# Patient Record
Sex: Female | Born: 1950 | Race: White | Hispanic: No | Marital: Married | Smoking: Former smoker
Health system: Southern US, Community
[De-identification: ages and names within clinical notes are randomized; demographics above are authoritative.]

## PROBLEM LIST (undated history)

## (undated) DIAGNOSIS — G4730 Sleep apnea, unspecified: Secondary | ICD-10-CM

## (undated) DIAGNOSIS — N6019 Diffuse cystic mastopathy of unspecified breast: Secondary | ICD-10-CM

## (undated) DIAGNOSIS — M1990 Unspecified osteoarthritis, unspecified site: Secondary | ICD-10-CM

## (undated) DIAGNOSIS — K219 Gastro-esophageal reflux disease without esophagitis: Secondary | ICD-10-CM

## (undated) DIAGNOSIS — J3089 Other allergic rhinitis: Secondary | ICD-10-CM

## (undated) DIAGNOSIS — I4891 Unspecified atrial fibrillation: Secondary | ICD-10-CM

## (undated) DIAGNOSIS — E7800 Pure hypercholesterolemia, unspecified: Secondary | ICD-10-CM

## (undated) DIAGNOSIS — IMO0001 Reserved for inherently not codable concepts without codable children: Secondary | ICD-10-CM

## (undated) DIAGNOSIS — I10 Essential (primary) hypertension: Secondary | ICD-10-CM

## (undated) HISTORY — DX: Gastro-esophageal reflux disease without esophagitis: K21.9

## (undated) HISTORY — DX: Pure hypercholesterolemia, unspecified: E78.00

## (undated) HISTORY — PX: TONSILLECTOMY: SUR1361

## (undated) HISTORY — DX: Unspecified atrial fibrillation: I48.91

## (undated) HISTORY — DX: Unspecified osteoarthritis, unspecified site: M19.90

## (undated) HISTORY — DX: Diffuse cystic mastopathy of unspecified breast: N60.19

## (undated) HISTORY — DX: Essential (primary) hypertension: I10

---

## 2001-03-30 HISTORY — PX: CHOLECYSTECTOMY: SHX55

## 2002-03-30 HISTORY — PX: REPLACEMENT TOTAL KNEE: SUR1224

## 2004-10-01 ENCOUNTER — Ambulatory Visit: Payer: Self-pay | Admitting: Internal Medicine

## 2005-10-14 ENCOUNTER — Ambulatory Visit: Payer: Self-pay | Admitting: Internal Medicine

## 2006-03-30 DIAGNOSIS — I4891 Unspecified atrial fibrillation: Secondary | ICD-10-CM

## 2006-03-30 HISTORY — DX: Unspecified atrial fibrillation: I48.91

## 2006-11-04 ENCOUNTER — Ambulatory Visit: Payer: Self-pay | Admitting: Internal Medicine

## 2007-02-18 ENCOUNTER — Ambulatory Visit: Payer: Self-pay | Admitting: Unknown Physician Specialty

## 2007-02-18 ENCOUNTER — Other Ambulatory Visit: Payer: Self-pay

## 2007-02-28 HISTORY — PX: REPLACEMENT TOTAL KNEE: SUR1224

## 2007-03-08 ENCOUNTER — Other Ambulatory Visit: Payer: Self-pay

## 2007-03-08 ENCOUNTER — Inpatient Hospital Stay: Payer: Self-pay | Admitting: Unknown Physician Specialty

## 2007-03-28 ENCOUNTER — Encounter: Payer: Self-pay | Admitting: Unknown Physician Specialty

## 2007-03-31 ENCOUNTER — Encounter: Payer: Self-pay | Admitting: Unknown Physician Specialty

## 2007-05-01 ENCOUNTER — Encounter: Payer: Self-pay | Admitting: Unknown Physician Specialty

## 2007-09-13 HISTORY — PX: OTHER SURGICAL HISTORY: SHX169

## 2007-11-08 ENCOUNTER — Ambulatory Visit: Payer: Self-pay | Admitting: Internal Medicine

## 2007-11-23 ENCOUNTER — Ambulatory Visit: Payer: Self-pay | Admitting: Internal Medicine

## 2008-07-02 ENCOUNTER — Ambulatory Visit: Payer: Self-pay | Admitting: Internal Medicine

## 2008-12-19 ENCOUNTER — Ambulatory Visit: Payer: Self-pay | Admitting: Internal Medicine

## 2009-09-27 ENCOUNTER — Ambulatory Visit: Payer: Self-pay | Admitting: Internal Medicine

## 2009-12-30 ENCOUNTER — Ambulatory Visit: Payer: Self-pay | Admitting: Internal Medicine

## 2010-05-20 ENCOUNTER — Ambulatory Visit: Payer: Self-pay | Admitting: Oncology

## 2010-05-29 ENCOUNTER — Ambulatory Visit: Payer: Self-pay | Admitting: Oncology

## 2010-06-29 ENCOUNTER — Ambulatory Visit: Payer: Self-pay | Admitting: Oncology

## 2011-02-17 ENCOUNTER — Ambulatory Visit: Payer: Self-pay | Admitting: Internal Medicine

## 2012-01-05 ENCOUNTER — Other Ambulatory Visit: Payer: Self-pay | Admitting: *Deleted

## 2012-01-05 MED ORDER — ROSUVASTATIN CALCIUM 10 MG PO TABS: 10.0000 mg | ORAL_TABLET | Freq: Every day | ORAL | Status: DC

## 2012-01-05 MED ORDER — DILTIAZEM HCL ER 120 MG PO CP24: 120.0000 mg | ORAL_CAPSULE | Freq: Every day | ORAL | Status: DC

## 2012-01-05 MED ORDER — TRIAMTERENE-HCTZ 37.5-25 MG PO CAPS: 1.0000 | ORAL_CAPSULE | Freq: Every day | ORAL | Status: DC

## 2012-01-05 NOTE — Telephone Encounter (Signed)
Rx was faxed to pharmacy.  

## 2012-01-05 NOTE — Addendum Note (Signed)
Addended by: Gilmer Mor on: 01/05/2012 08:24 AM   Modules accepted: Orders

## 2012-01-27 ENCOUNTER — Telehealth: Payer: Self-pay | Admitting: Internal Medicine

## 2012-01-27 NOTE — Telephone Encounter (Signed)
Refill request for meloxicam 7.5 mg tab #30 Sig: take 1 tablet every day Patient has an appointment on 03/08/12

## 2012-01-29 MED ORDER — MELOXICAM 7.5 MG PO TABS: 7.5000 mg | ORAL_TABLET | Freq: Every day | ORAL | Status: DC

## 2012-01-29 NOTE — Telephone Encounter (Signed)
Ok x 2  

## 2012-02-24 ENCOUNTER — Telehealth: Payer: Self-pay | Admitting: Internal Medicine

## 2012-02-24 MED ORDER — MONTELUKAST SODIUM 10 MG PO TABS: 10.0000 mg | ORAL_TABLET | Freq: Every day | ORAL | Status: DC

## 2012-02-24 NOTE — Telephone Encounter (Signed)
Refill request for montelukast sodium 10 mg tab #30 Sig: take 1 tablet at bedtime Patient does have an appt on 12/10

## 2012-02-24 NOTE — Telephone Encounter (Signed)
Filled script per epic 

## 2012-02-24 NOTE — Telephone Encounter (Signed)
Ok to fill #30 with 2 refills

## 2012-02-24 NOTE — Telephone Encounter (Signed)
Dr. Lorin Picket:  Patient requesting new script for montelukast 10 mg  1 at bedtime

## 2012-03-08 ENCOUNTER — Ambulatory Visit (INDEPENDENT_AMBULATORY_CARE_PROVIDER_SITE_OTHER): Payer: BC Managed Care – PPO | Admitting: Internal Medicine

## 2012-03-08 ENCOUNTER — Encounter: Payer: Self-pay | Admitting: Internal Medicine

## 2012-03-08 ENCOUNTER — Telehealth: Payer: Self-pay | Admitting: *Deleted

## 2012-03-08 VITALS — BP 122/72 | HR 72 | Temp 98.6°F | Ht 65.0 in | Wt 303.0 lb

## 2012-03-08 DIAGNOSIS — I4891 Unspecified atrial fibrillation: Secondary | ICD-10-CM

## 2012-03-08 DIAGNOSIS — I10 Essential (primary) hypertension: Secondary | ICD-10-CM

## 2012-03-08 DIAGNOSIS — K219 Gastro-esophageal reflux disease without esophagitis: Secondary | ICD-10-CM

## 2012-03-08 DIAGNOSIS — E7800 Pure hypercholesterolemia, unspecified: Secondary | ICD-10-CM

## 2012-03-08 DIAGNOSIS — Z139 Encounter for screening, unspecified: Secondary | ICD-10-CM

## 2012-03-08 LAB — BASIC METABOLIC PANEL
BUN: 20 mg/dL (ref 6–23)
CO2: 29 mEq/L (ref 19–32)
Calcium: 9.1 mg/dL (ref 8.4–10.5)
Glucose, Bld: 100 mg/dL — ABNORMAL HIGH (ref 70–99)
Potassium: 4.3 mEq/L (ref 3.5–5.1)
Sodium: 137 mEq/L (ref 135–145)

## 2012-03-08 LAB — CBC WITH DIFFERENTIAL/PLATELET
Basophils Absolute: 0.1 10*3/uL (ref 0.0–0.1)
Eosinophils Relative: 3.6 % (ref 0.0–5.0)
HCT: 52.4 % — ABNORMAL HIGH (ref 36.0–46.0)
Lymphs Abs: 1.7 10*3/uL (ref 0.7–4.0)
Monocytes Relative: 9.8 % (ref 3.0–12.0)
Neutrophils Relative %: 58.6 % (ref 43.0–77.0)
Platelets: 219 10*3/uL (ref 150.0–400.0)
RDW: 13.6 % (ref 11.5–14.6)
WBC: 6.4 10*3/uL (ref 4.5–10.5)

## 2012-03-08 LAB — HEPATIC FUNCTION PANEL
AST: 25 U/L (ref 0–37)
Albumin: 4.1 g/dL (ref 3.5–5.2)
Alkaline Phosphatase: 66 U/L (ref 39–117)
Total Protein: 7.5 g/dL (ref 6.0–8.3)

## 2012-03-08 LAB — LIPID PANEL
Cholesterol: 162 mg/dL (ref 0–200)
HDL: 54.3 mg/dL (ref >39.00)

## 2012-03-08 LAB — TSH: TSH: 1.13 u[IU]/mL (ref 0.35–5.50)

## 2012-03-08 NOTE — Patient Instructions (Signed)
It was nice seeing you today.  I am sorry you have not been feeling as well lately.  I want you to see cardiology today.  Let me know if you have any problems.

## 2012-03-08 NOTE — Assessment & Plan Note (Addendum)
Atrial fib occurred post surgery.   ECHO 08/31/11 revealed normal heart function with no significant valve abnormality.  Previous holter showed no significant arrhythmia.  Increased fluttering and palpitations recently.  Heart exam as outlined.  EKG today revealed afib with ventricular rate 120.  Appt with cardiology today.  Pt comfortable with this plan.

## 2012-03-08 NOTE — Assessment & Plan Note (Signed)
Symptoms improved on Prilosec.  Follow.

## 2012-03-08 NOTE — Assessment & Plan Note (Signed)
On Crestor.  Continue low cholesterol diet and exercise.  Follow lipid panel and liver function.  

## 2012-03-08 NOTE — Progress Notes (Signed)
Subjective:    Patient ID: Kristin [name], female    DOB: [birth year], 61 y.o.   MRN: [id number]  HPI 61 year old female with past history of hypertension, hypercholesterolemia and degenerative joint disease who comes in today for a scheduled follow up.  She states she has been noticing more palpitations and fluttering recently - especially since 12/9.  States it makes her feel weak when it occurs.  No chest pain.  Breathing stable.  No nausea or vomiting.  Bowels stable.  Increased stress.  Feels she is handling things relatively well.    Past Medical History  Diagnosis Date  . Hypertension   . Hypercholesterolemia   . Fibrocystic breast disease   . Degenerative joint disease   . Atrial fibrillation     s/p knee surgery.  maintained in SR since  . GERD (gastroesophageal reflux disease)     Current Outpatient Prescriptions on File Prior to Visit  Medication Sig Dispense Refill  . cetirizine (ZYRTEC) 10 MG tablet Take 10 mg by mouth daily.      Marland Kitchen diltiazem (DILACOR XR) 120 MG 24 hr capsule Take 1 capsule (120 mg total) by mouth daily.  30 capsule  2  . fluticasone (FLOVENT DISKUS) 50 MCG/BLIST diskus inhaler Inhale 1 puff into the lungs 2 (two) times daily.      . meloxicam (MOBIC) 7.5 MG tablet Take 1 tablet (7.5 mg total) by mouth daily.  30 tablet  2  . montelukast (SINGULAIR) 10 MG tablet Take 1 tablet (10 mg total) by mouth at bedtime.  1 tablet  2  . rosuvastatin (CRESTOR) 10 MG tablet Take 1 tablet (10 mg total) by mouth daily.  30 tablet  2  . Calcium-Vitamin D 600-200 MG-UNIT per tablet Take 1 tablet by mouth daily.        Review of Systems Patient denies any headache or significant dizziness.   No chest pain, tightness.  Does report the increased fluttering and palpitations as outlined.  No increased shortness of breath, cough or congestion.  No nausea or vomiting.  No significant acid reflux.  No abdominal pain or cramping.  No bowel change, such as diarrhea, constipation,  BRBPR or melana.  No urine change.        Objective:   Physical Exam Filed Vitals:   03/08/12 0952  BP: 122/72  Pulse: 72  Temp: 98.6 F (22 C)   61 year old female in no acute distress.   HEENT:  Nares - clear.  OP- without lesions or erythema.  NECK:  Supple, nontender.  No audible bruit.   HEART:  Irregularly irregular.  Ventricular rate 112.   LUNGS:  Without crackles or wheezing audible.  Respirations even and unlabored.   RADIAL PULSE:  Equal bilaterally.  ABDOMEN:  Soft, nontender.  No audible abdominal bruit.   EXTREMITIES:  No increased edema to be present.                  Assessment & Plan:  HEMATOLOGY.  Previous H&H increased.  Saw Dr Orlie Dakin.  Recommended no further w/up unless hgb remaining above 18.  Recheck cbc.   GI.  Previous abdominal discomfort.  She related to increased stress.  H. Pylori negative.  She declines colonoscopy.  Currently doing well.  Follow.   INCREASED PSYCHOSOCIAL STRESSORS.  Feels she is handling things relatively well.  Follow.   FATIGUE.  Check cbc, met c and tsh.   HEALTH MAINTENANCE.  Schedule physical for next visit.  GI as above.  Mammogram 02/17/11 - BiRADS II.  Schedule a follow up mammogram.

## 2012-03-11 ENCOUNTER — Telehealth: Payer: Self-pay | Admitting: Internal Medicine

## 2012-03-11 NOTE — Telephone Encounter (Signed)
Patient wants her blood results posted to my chart.

## 2012-03-11 NOTE — Telephone Encounter (Signed)
I don't know how to do this.  I have reviewed labs and she was notified of results.  I have not ever had to do anything to make the pt see them.  Do you know what to do?  thanks

## 2012-03-12 ENCOUNTER — Encounter: Payer: Self-pay | Admitting: Internal Medicine

## 2012-03-12 NOTE — Assessment & Plan Note (Signed)
Blood pressure under good control.  Same meds.  Check metabolic panel.  

## 2012-03-14 ENCOUNTER — Encounter: Payer: Self-pay | Admitting: *Deleted

## 2012-03-14 NOTE — Telephone Encounter (Signed)
Dr. Lorin Picket, since this patient is active on my chart you could have released her results directly to her via mychart.  I have copied and pasted her last labs and sent via mychart message.    Thanks [employer]

## 2012-03-28 ENCOUNTER — Ambulatory Visit: Payer: Self-pay | Admitting: Internal Medicine

## 2012-04-05 ENCOUNTER — Ambulatory Visit: Payer: Self-pay | Admitting: Internal Medicine

## 2012-04-09 NOTE — Telephone Encounter (Signed)
Opened in error - when adding medicine.

## 2012-04-12 ENCOUNTER — Encounter: Payer: Self-pay | Admitting: Internal Medicine

## 2012-04-12 ENCOUNTER — Ambulatory Visit (INDEPENDENT_AMBULATORY_CARE_PROVIDER_SITE_OTHER): Payer: BC Managed Care – PPO | Admitting: Internal Medicine

## 2012-04-12 VITALS — BP 120/78 | HR 82 | Temp 98.3°F | Ht 65.0 in | Wt 309.5 lb

## 2012-04-12 DIAGNOSIS — E7800 Pure hypercholesterolemia, unspecified: Secondary | ICD-10-CM

## 2012-04-12 DIAGNOSIS — K219 Gastro-esophageal reflux disease without esophagitis: Secondary | ICD-10-CM

## 2012-04-12 DIAGNOSIS — I4891 Unspecified atrial fibrillation: Secondary | ICD-10-CM

## 2012-04-12 DIAGNOSIS — I10 Essential (primary) hypertension: Secondary | ICD-10-CM

## 2012-04-17 ENCOUNTER — Encounter: Payer: Self-pay | Admitting: Internal Medicine

## 2012-04-17 NOTE — Assessment & Plan Note (Signed)
Low cholesterol diet.  Follow lipid panel.    

## 2012-04-17 NOTE — Assessment & Plan Note (Signed)
On Flecainide now.  On Diltiazem.  In SR.  VR 60.  Continue to follow up with cardiology.

## 2012-04-17 NOTE — Progress Notes (Signed)
  Subjective:    Patient ID: Kristin [name], female    DOB: 04-22-1950, 62 y.o.   MRN: [id number]  HPI 62 year old female with past history of hypertension, hypercholesterolemia and degenerative joint disease who comes in today for a scheduled follow up.  Last visit was found to be back in afib.  Was evaluated by cardiology.  On Flecainide.  Back in SR now.  She is taking diltiazem once daily now.  Last episode of afib - last week.  Breathing overall stable.  She has had some increased cough and congestion.  Intermittent tightness in her chest with the cough.  No other chest pain or tighthness.  Cold/cough is better now.  No nausea or vomiting.  Bowels stable.  Increased stress.  Feels she is handling things relatively well.  Blood pressure averaging 130s/70s.     Past Medical History  Diagnosis Date  . Hypertension   . Hypercholesterolemia   . Fibrocystic breast disease   . Degenerative joint disease   . Atrial fibrillation     s/p knee surgery.  maintained in SR since  . GERD (gastroesophageal reflux disease)     Review of Systems Patient denies any headache or significant dizziness.  No significant sinus issues.   No chest pain or significant tightness.  No increased shortness of breath.  Has had some cough and congestion.  No nausea or vomiting.  No significant acid reflux.  No abdominal pain or cramping.  No bowel change, such as diarrhea, constipation, BRBPR or melana.  No urine change.  Feels she is handling stress relatively well.        Objective:   Physical Exam  Filed Vitals:   04/12/12 0911  BP: 120/78  Pulse: 82  Temp: 98.3 F (36.8 C)   Blood pressure recheck 124/74, pulse 15  62 year old female in no acute distress.   HEENT:  Nares - clear.  OP- without lesions or erythema.  NECK:  Supple, nontender.  No audible bruit.   HEART:  Appears to be regular.  Rate 60.     LUNGS:  Without crackles or wheezing audible.  Respirations even and unlabored.   RADIAL PULSE:   Equal bilaterally.  ABDOMEN:  Soft, nontender.  No audible abdominal bruit.   EXTREMITIES:  No increased edema to be present.                  Assessment & Plan:  HEMATOLOGY.  Previous H&H increased.  Saw Dr Orlie Dakin.  Recommended no further w/up unless hgb remaining above 18.  Recheck cbc.   GI.  Previous abdominal discomfort.  She related to increased stress.  H. Pylori negative.  She declines colonoscopy.  Currently doing well.  Follow.   INCREASED PSYCHOSOCIAL STRESSORS.  Feels she is handling things relatively well.  Follow.  PROBABLE VIRAL RESPIRATORY INFECTION.  Symptoms improving.  Do not feel abx warranted.  Symptomatic treatment.  Saline.  Follow.  Avoid decongestants.   HEALTH MAINTENANCE.  Schedule physical for next visit.  GI as above.  Mammogram 02/17/11 - BiRADS II.  Mammogram ordered.

## 2012-04-17 NOTE — Assessment & Plan Note (Signed)
Symptoms controlled.  Same meds.  Follow.   

## 2012-04-17 NOTE — Assessment & Plan Note (Signed)
Blood pressure appears to be doing well.  Same medication.  Follow.

## 2012-04-18 ENCOUNTER — Encounter: Payer: Self-pay | Admitting: Internal Medicine

## 2012-04-18 ENCOUNTER — Other Ambulatory Visit: Payer: Self-pay | Admitting: *Deleted

## 2012-04-18 ENCOUNTER — Telehealth: Payer: Self-pay | Admitting: Internal Medicine

## 2012-04-18 MED ORDER — TRIAMTERENE-HCTZ 37.5-25 MG PO TABS: 1.0000 | ORAL_TABLET | Freq: Every day | ORAL | Status: DC

## 2012-04-18 MED ORDER — ROSUVASTATIN CALCIUM 10 MG PO TABS: 10.0000 mg | ORAL_TABLET | Freq: Every day | ORAL | Status: DC

## 2012-04-18 MED ORDER — MELOXICAM 7.5 MG PO TABS: 7.5000 mg | ORAL_TABLET | Freq: Every day | ORAL | Status: DC

## 2012-04-18 NOTE — Telephone Encounter (Signed)
rosuvastatin (CRESTOR) 10 MG tablet  #30  triamterene-hydrochlorothiazide (MAXZIDE-25) 37.5-25 MG per tablet   #30  Meloxicam 7.5 mg tablet  #30

## 2012-04-18 NOTE — Telephone Encounter (Signed)
Filled script per epic

## 2012-04-20 NOTE — Telephone Encounter (Signed)
Open in error

## 2012-05-05 ENCOUNTER — Telehealth: Payer: Self-pay | Admitting: Internal Medicine

## 2012-05-05 DIAGNOSIS — I10 Essential (primary) hypertension: Secondary | ICD-10-CM

## 2012-05-05 DIAGNOSIS — I4891 Unspecified atrial fibrillation: Secondary | ICD-10-CM

## 2012-05-05 NOTE — Telephone Encounter (Signed)
Spoke with patient, she stated she went to see a cardiologist in [facility]. He is requesting she have a sleep study and she said she would get her PCP to order her one so it may be done locally in [place]. Dr. Constance Haw is the cardiologist she seen in [facility], he thinks she may have sleep apnea and he want to do her ablasion ASAP and she has been going into Afib a lot more, almost everyday. Please refer her out for the sleep study.

## 2012-05-05 NOTE — Telephone Encounter (Signed)
Order placed for sleep study.

## 2012-05-05 NOTE — Telephone Encounter (Signed)
Patient want a sleep study done ASAP . Her Cardiologist is recommending that she have this because of her Afib and she wants this performed soon and then she will be able to have her Oblation done. Wants a call back from the nurse.

## 2012-05-05 NOTE — Telephone Encounter (Signed)
Patient notified, left message on voice mail per patient request.

## 2012-05-05 NOTE — Telephone Encounter (Signed)
Order placed for referral for sleep study.

## 2012-05-15 ENCOUNTER — Other Ambulatory Visit: Payer: Self-pay

## 2012-05-18 ENCOUNTER — Ambulatory Visit: Payer: Self-pay | Admitting: Internal Medicine

## 2012-05-24 ENCOUNTER — Other Ambulatory Visit: Payer: Self-pay | Admitting: *Deleted

## 2012-05-24 MED ORDER — MONTELUKAST SODIUM 10 MG PO TABS: 10.0000 mg | ORAL_TABLET | Freq: Every day | ORAL | Status: DC

## 2012-05-25 ENCOUNTER — Telehealth: Payer: Self-pay | Admitting: *Deleted

## 2012-05-25 MED ORDER — ZOLPIDEM TARTRATE 5 MG PO TABS: 5.0000 mg | ORAL_TABLET | Freq: Every evening | ORAL | Status: DC | PRN

## 2012-05-25 NOTE — Telephone Encounter (Signed)
Called patient with sleep study results. Patient wants Amber to call her before appointment is made. Mailed copy of results to patient and faxed Dr. Wyonia Hough copy. Called Ambien in to pharmacy.

## 2012-06-02 ENCOUNTER — Telehealth: Payer: Self-pay | Admitting: Internal Medicine

## 2012-06-02 NOTE — Telephone Encounter (Signed)
Pt states she spoke with [facility] this morning and they state they have not received paperwork from [place] for pt supplies.

## 2012-06-07 ENCOUNTER — Telehealth: Payer: Self-pay | Admitting: Internal Medicine

## 2012-06-07 NOTE — Telephone Encounter (Signed)
Fax to # [phone].  Patient needing paper work [facility]ed back [facility] her titration and mask fitting.  Patient wants someone to call her after the information has been faxed to [facility].

## 2012-06-09 ENCOUNTER — Encounter: Payer: Self-pay | Admitting: Internal Medicine

## 2012-06-10 NOTE — Telephone Encounter (Signed)
Called patient to let her know form have been faxed over to [facility].

## 2012-06-21 ENCOUNTER — Encounter: Payer: Self-pay | Admitting: Internal Medicine

## 2012-06-21 ENCOUNTER — Ambulatory Visit (INDEPENDENT_AMBULATORY_CARE_PROVIDER_SITE_OTHER): Payer: BC Managed Care – PPO | Admitting: Internal Medicine

## 2012-06-21 VITALS — BP 110/70 | HR 110 | Temp 97.6°F | Ht 65.0 in | Wt 293.5 lb

## 2012-06-21 DIAGNOSIS — I4891 Unspecified atrial fibrillation: Secondary | ICD-10-CM

## 2012-06-21 DIAGNOSIS — I10 Essential (primary) hypertension: Secondary | ICD-10-CM

## 2012-06-21 DIAGNOSIS — E7800 Pure hypercholesterolemia, unspecified: Secondary | ICD-10-CM

## 2012-06-21 DIAGNOSIS — K219 Gastro-esophageal reflux disease without esophagitis: Secondary | ICD-10-CM

## 2012-06-22 ENCOUNTER — Ambulatory Visit: Payer: Self-pay | Admitting: Internal Medicine

## 2012-07-07 ENCOUNTER — Other Ambulatory Visit: Payer: Self-pay | Admitting: *Deleted

## 2012-07-07 ENCOUNTER — Telehealth: Payer: Self-pay | Admitting: Internal Medicine

## 2012-07-07 MED ORDER — FLUTICASONE PROPIONATE 50 MCG/ACT NA SUSP: 2.0000 | Freq: Every day | NASAL | Status: DC

## 2012-07-07 MED ORDER — FLUTICASONE PROPIONATE (INHAL) 50 MCG/BLIST IN AEPB: 1.0000 | INHALATION_SPRAY | Freq: Two times a day (BID) | RESPIRATORY_TRACT | Status: DC

## 2012-07-07 NOTE — Telephone Encounter (Signed)
[facility] calling stating they received a call from [facility] who stated that the pt received Rx for Flovent today but was requesting nasal spray, like Flonase, instead.  Please advise  [facility] [phone]

## 2012-07-07 NOTE — Telephone Encounter (Signed)
Pt stated she already picked it up.

## 2012-07-07 NOTE — Telephone Encounter (Signed)
[facility] sent rx request for flovent and we filled it but now [facility] is saying that it was suppose to be flonase. My question, is it ok to give patient flonase? This is not in her med list.

## 2012-07-07 NOTE — Telephone Encounter (Signed)
rx sent if for Flonase #1 with one refill

## 2012-07-11 ENCOUNTER — Telehealth: Payer: Self-pay | Admitting: Internal Medicine

## 2012-07-11 NOTE — Telephone Encounter (Signed)
Patient needing a full face mask for her CPAP machine . Paper work will be sent from [facility].

## 2012-07-11 NOTE — Telephone Encounter (Signed)
Waiting on paper to be faxed over.

## 2012-07-12 NOTE — Telephone Encounter (Signed)
I have not received this yet.  May need to call [facility] and let them know that I have not received.  Make sure they are sending to [facility]

## 2012-07-12 NOTE — Telephone Encounter (Signed)
noted 

## 2012-07-12 NOTE — Telephone Encounter (Signed)
Called patient and she stated that she has appointment with the [facility] on Friday 18 and they are going to fit her for the full face mask and they will then fax over some papers to inform you Dr. Lorin Picket what went on at her appointment.

## 2012-07-18 ENCOUNTER — Encounter: Payer: Self-pay | Admitting: Internal Medicine

## 2012-07-18 NOTE — Assessment & Plan Note (Signed)
Symptoms controlled.  Same meds.  Follow.   

## 2012-07-18 NOTE — Progress Notes (Signed)
Subjective:    Patient ID: Kristin [name], female    DOB: 05/22/1950, 62 y.o.   MRN: [id number]  HPI 62 year old female with past history of hypertension, hypercholesterolemia and degenerative joint disease who comes in today for a scheduled follow up.  In afib. On Flecainide.  She is taking diltiazem once daily now.  Intermittent episodes of afib.  States she is in and out of afib. Had questions about ablation. Saw Dr Constance Haw.  Breathing overall stable.  No nausea or vomiting.  Bowels stable.  Increased stress.  Feels she is handling things relatively well.  She has known sleep apnea.  Planning for a fitting tomorrow.      Past Medical History  Diagnosis Date  . Hypertension   . Hypercholesterolemia   . Fibrocystic breast disease   . Degenerative joint disease   . Atrial fibrillation     s/p knee surgery.  maintained in SR since  . GERD (gastroesophageal reflux disease)      Current Outpatient Prescriptions on File Prior to Visit  Medication Sig Dispense Refill  . aspirin 325 MG EC tablet Take 325 mg by mouth daily.      . Calcium-Vitamin D 600-200 MG-UNIT per tablet Take 1 tablet by mouth daily.      . cetirizine (ZYRTEC) 10 MG tablet Take 10 mg by mouth daily.      Marland Kitchen diltiazem (DILACOR XR) 120 MG 24 hr capsule Take 1 capsule (120 mg total) by mouth daily.  30 capsule  2  . flecainide (TAMBOCOR) 150 MG tablet Take 150 mg by mouth 2 (two) times daily.      . meloxicam (MOBIC) 7.5 MG tablet Take 1 tablet (7.5 mg total) by mouth daily.  30 tablet  6  . montelukast (SINGULAIR) 10 MG tablet Take 1 tablet (10 mg total) by mouth at bedtime.  30 tablet  5  . rosuvastatin (CRESTOR) 10 MG tablet Take 1 tablet (10 mg total) by mouth daily.  30 tablet  6  . triamterene-hydrochlorothiazide (MAXZIDE-25) 37.5-25 MG per tablet Take 1 each (1 tablet total) by mouth daily.  30 tablet  6  . zolpidem (AMBIEN) 5 MG tablet Take 1 tablet (5 mg total) by mouth at bedtime as needed for sleep.  2 tablet  0    No current facility-administered medications on file prior to visit.    Review of Systems Patient denies any headache or significant dizziness.  No significant sinus issues.   No chest pain or significant tightness.  No increased shortness of breath.  Intermittent increased heart rate and palpitations.   No nausea or vomiting.  No significant acid reflux.  No abdominal pain or cramping.  No bowel change, such as diarrhea, constipation, BRBPR or melana.  No urine change.  Feels she is handling stress relatively well.      Objective:   Physical Exam  Filed Vitals:   06/21/12 1005  BP: 110/70  Pulse: 110  Temp: 97.6 F (36.4 C)   Blood pressure recheck 112/72, pulse 67  62 year old female in no acute distress.   HEENT:  Nares - clear.  OP- without lesions or erythema.  NECK:  Supple, nontender.  No audible bruit.   HEART:  Appears to be irregular.  Rate 92    LUNGS:  Without crackles or wheezing audible.  Respirations even and unlabored.   RADIAL PULSE:  Equal bilaterally.  ABDOMEN:  Soft, nontender.  No audible abdominal bruit.   EXTREMITIES:  No increased edema to be present.                  Assessment & Plan:  HEMATOLOGY.  Previous H&H increased.  Saw Dr Orlie Dakin.  Recommended no further w/up unless hgb remaining above 18.  Recheck cbc.   GI.  Previous abdominal discomfort.  She related to increased stress.  H. Pylori negative.  She declines colonoscopy.  Currently doing well.  Follow.   INCREASED PSYCHOSOCIAL STRESSORS.  Feels she is handling things relatively well.  Follow.  HEALTH MAINTENANCE.  Schedule physical for next visit.  GI as above.  Mammogram 02/17/11 - BiRADS II.  Mammogram 03/28/12 recommended follow up views.  Follow up views 04/05/12 - Birads II.

## 2012-07-18 NOTE — Assessment & Plan Note (Signed)
Blood pressure appears to be doing well.  Same medication.  Follow.      

## 2012-07-18 NOTE — Assessment & Plan Note (Signed)
Low cholesterol diet.  Follow lipid panel.    

## 2012-07-18 NOTE — Assessment & Plan Note (Signed)
On Flecainide now.  On Diltiazem.  Intermittent "flares".  Continue to follow up with cardiology.

## 2012-09-06 ENCOUNTER — Other Ambulatory Visit (INDEPENDENT_AMBULATORY_CARE_PROVIDER_SITE_OTHER): Payer: BC Managed Care – PPO

## 2012-09-06 ENCOUNTER — Encounter: Payer: Self-pay | Admitting: Internal Medicine

## 2012-09-06 DIAGNOSIS — I10 Essential (primary) hypertension: Secondary | ICD-10-CM

## 2012-09-06 DIAGNOSIS — E7800 Pure hypercholesterolemia, unspecified: Secondary | ICD-10-CM

## 2012-09-06 DIAGNOSIS — I4891 Unspecified atrial fibrillation: Secondary | ICD-10-CM

## 2012-09-06 LAB — CBC WITH DIFFERENTIAL/PLATELET
Basophils Absolute: 0 10*3/uL (ref 0.0–0.1)
Basophils Relative: 0.7 % (ref 0.0–3.0)
Eosinophils Absolute: 0.5 10*3/uL (ref 0.0–0.7)
Hemoglobin: 15.3 g/dL — ABNORMAL HIGH (ref 12.0–15.0)
Lymphs Abs: 1.3 10*3/uL (ref 0.7–4.0)
MCHC: 33.3 g/dL (ref 30.0–36.0)
MCV: 90.1 fl (ref 78.0–100.0)
Monocytes Absolute: 0.5 10*3/uL (ref 0.1–1.0)
Neutro Abs: 3.7 10*3/uL (ref 1.4–7.7)
RBC: 5.1 Mil/uL (ref 3.87–5.11)
RDW: 14.3 % (ref 11.5–14.6)

## 2012-09-06 LAB — HEPATIC FUNCTION PANEL
ALT: 20 U/L (ref 0–35)
AST: 21 U/L (ref 0–37)
Alkaline Phosphatase: 70 U/L (ref 39–117)
Bilirubin, Direct: 0.1 mg/dL (ref 0.0–0.3)
Total Protein: 7.2 g/dL (ref 6.0–8.3)

## 2012-09-06 LAB — BASIC METABOLIC PANEL
BUN: 18 mg/dL (ref 6–23)
Calcium: 9 mg/dL (ref 8.4–10.5)
Creatinine, Ser: 0.9 mg/dL (ref 0.4–1.2)
GFR: 63.85 mL/min (ref >60.00)

## 2012-09-06 LAB — LIPID PANEL
Cholesterol: 183 mg/dL (ref 0–200)
LDL Cholesterol: 104 mg/dL — ABNORMAL HIGH (ref 0–99)
Triglycerides: 79 mg/dL (ref 0.0–149.0)

## 2012-09-13 ENCOUNTER — Ambulatory Visit (INDEPENDENT_AMBULATORY_CARE_PROVIDER_SITE_OTHER): Payer: BC Managed Care – PPO | Admitting: Internal Medicine

## 2012-09-13 ENCOUNTER — Encounter: Payer: Self-pay | Admitting: Internal Medicine

## 2012-09-13 ENCOUNTER — Other Ambulatory Visit (HOSPITAL_COMMUNITY)
Admission: RE | Admit: 2012-09-13 | Discharge: 2012-09-13 | Disposition: A | Discharge status: Home / Self Care | Payer: BC Managed Care – PPO | Source: Ambulatory Visit | Attending: Internal Medicine | Admitting: Internal Medicine

## 2012-09-13 VITALS — BP 122/68 | HR 74 | Temp 97.7°F | Ht 65.5 in | Wt 304.0 lb

## 2012-09-13 DIAGNOSIS — E7800 Pure hypercholesterolemia, unspecified: Secondary | ICD-10-CM

## 2012-09-13 DIAGNOSIS — K219 Gastro-esophageal reflux disease without esophagitis: Secondary | ICD-10-CM

## 2012-09-13 DIAGNOSIS — Z01419 Encounter for gynecological examination (general) (routine) without abnormal findings: Secondary | ICD-10-CM | POA: Insufficient documentation

## 2012-09-13 DIAGNOSIS — I4891 Unspecified atrial fibrillation: Secondary | ICD-10-CM

## 2012-09-13 DIAGNOSIS — Z1151 Encounter for screening for human papillomavirus (HPV): Secondary | ICD-10-CM | POA: Insufficient documentation

## 2012-09-13 DIAGNOSIS — Z1211 Encounter for screening for malignant neoplasm of colon: Secondary | ICD-10-CM

## 2012-09-13 DIAGNOSIS — G4733 Obstructive sleep apnea (adult) (pediatric): Secondary | ICD-10-CM

## 2012-09-13 DIAGNOSIS — I10 Essential (primary) hypertension: Secondary | ICD-10-CM

## 2012-09-13 DIAGNOSIS — Z124 Encounter for screening for malignant neoplasm of cervix: Secondary | ICD-10-CM

## 2012-09-13 NOTE — Progress Notes (Signed)
Subjective:    Patient ID: Kristin [name], female    DOB: 06-01-1950, 62 y.o.   MRN: [id number]  HPI 62 year old female with past history of hypertension, hypercholesterolemia and degenerative joint disease who comes in today to follow up on these issues as well as for a complete physical exam. Has had issues recently with afib.  On Flecainide.  She is taking diltiazem once daily.  Seeing Dr Constance Haw.  Now being treated for her sleep apnea.  No afib (per pt) in over one month.  Resting better.  Feels better.  Breathing stable.  No nausea or vomiting.  Bowels stable.  Has had increased stress.  Feels she is handling things relatively well.      Past Medical History  Diagnosis Date  . Hypertension   . Hypercholesterolemia   . Fibrocystic breast disease   . Degenerative joint disease   . Atrial fibrillation     s/p knee surgery.  maintained in SR since  . GERD (gastroesophageal reflux disease)     Current Outpatient Prescriptions on File Prior to Visit  Medication Sig Dispense Refill  . aspirin 325 MG EC tablet Take 325 mg by mouth daily.      . cetirizine (ZYRTEC) 10 MG tablet Take 10 mg by mouth daily.      Marland Kitchen diltiazem (DILACOR XR) 120 MG 24 hr capsule Take 1 capsule (120 mg total) by mouth daily.  30 capsule  2  . flecainide (TAMBOCOR) 150 MG tablet Take 150 mg by mouth 2 (two) times daily.      . fluticasone (FLONASE) 50 MCG/ACT nasal spray Place 2 sprays into the nose daily.  16 g  1  . fluticasone (FLOVENT DISKUS) 50 MCG/BLIST diskus inhaler Inhale 1 puff into the lungs 2 (two) times daily.  1 Inhaler  5  . meloxicam (MOBIC) 7.5 MG tablet Take 1 tablet (7.5 mg total) by mouth daily.  30 tablet  6  . montelukast (SINGULAIR) 10 MG tablet Take 1 tablet (10 mg total) by mouth at bedtime.  30 tablet  5  . rosuvastatin (CRESTOR) 10 MG tablet Take 1 tablet (10 mg total) by mouth daily.  30 tablet  6  . triamterene-hydrochlorothiazide (MAXZIDE-25) 37.5-25 MG per tablet Take 1 each (1 tablet  total) by mouth daily.  30 tablet  6   No current facility-administered medications on file prior to visit.    Review of Systems Patient denies any headache or dizziness.  No significant sinus issues.   No chest pain, tightness or palpitations.  No increased shortness of breath.  No afib (per patient) in over one month.   No nausea or vomiting.  No acid reflux.  No abdominal pain or cramping.  No bowel change, such as diarrhea, constipation, BRBPR or melana.  No urine change.  Feels she is handling stress relatively well.  Tolerating CPAP.  Sleeping better.  Feels better.       Objective:   Physical Exam  Filed Vitals:   09/13/12 0937  BP: 122/68  Pulse: 74  Temp: 97.7 F (36.5 C)   Pulse 89  62 year old female in no acute distress.   HEENT:  Nares- clear.  Oropharynx - without lesions. NECK:  Supple.  Nontender.  No audible bruit.  HEART:  Appears to be regular. LUNGS:  No crackles or wheezing audible.  Respirations even and unlabored.  RADIAL PULSE:  Equal bilaterally.    BREASTS:  No nipple discharge or nipple retraction present.  Could not appreciate any distinct nodules or axillary adenopathy.  ABDOMEN:  Soft, nontender.  Bowel sounds present and normal.  No audible abdominal bruit.  GU:  Normal external genitalia.  Vaginal vault without lesions.  Cervix identified.  Pap performed. Could not appreciate any adnexal masses or tenderness.   RECTAL:  Heme negative.   EXTREMITIES:  No increased edema present.  DP pulses palpable and equal bilaterally.          Assessment & Plan:  HEMATOLOGY.  Previous H&H increased.  Saw Dr Orlie Dakin.  Recommended no further w/up unless hgb remaining above 18.  CBC just checked since being treated for her sleep apnea and her hgb was 15.  Follow.    GI.  Previous abdominal discomfort.  She related to increased stress.  H. Pylori negative.  She continues to decline colonoscopy.  Currently doing well with no abdominal pain.  Follow.   INCREASED  PSYCHOSOCIAL STRESSORS.  Feels she is handling things relatively well.  Follow.  HEALTH MAINTENANCE.  Physical today.  Pap today.  GI as above. IFOB given.   Mammogram 02/17/11 - BiRADS II.  Mammogram 03/28/12 recommended follow up views.  Follow up views 04/05/12 - Birads II.

## 2012-09-13 NOTE — Assessment & Plan Note (Signed)
Symptoms controlled.  Same meds.  Follow.   

## 2012-09-13 NOTE — Assessment & Plan Note (Signed)
Blood pressure appears to be doing well.  Same medication.  Follow.

## 2012-09-13 NOTE — Assessment & Plan Note (Signed)
Low cholesterol diet.  Follow lipid panel.  Cholesterol just checked 09/06/12 revealed total cholesterol 183, triglycerides 79, HDL 63 and LDL 104.

## 2012-09-13 NOTE — Assessment & Plan Note (Signed)
On Flecainide now.  On Diltiazem.  No afib recently per pt.  Continue to follow up with cardiology.

## 2012-09-13 NOTE — Assessment & Plan Note (Signed)
Using CPAP and doing well.  Feels better.

## 2012-09-15 ENCOUNTER — Encounter: Payer: Self-pay | Admitting: Internal Medicine

## 2012-10-13 ENCOUNTER — Telehealth: Payer: Self-pay | Admitting: Internal Medicine

## 2012-10-13 NOTE — Telephone Encounter (Signed)
BP 168/85 on waking this morning.  176/84 last night.  Pt is having swelling in feet.  Feels like she has fever but when takes temp does not have a fever.  Pt did not want to see Raquel, prefers to see Dr. Lorin Picket.  Appt made for Monday 7/21 @ 3:30 p.m.  Please advise if pt can see pt sooner.

## 2012-10-13 NOTE — Telephone Encounter (Signed)
Pt notified of appt change.

## 2012-10-13 NOTE — Telephone Encounter (Signed)
I can see her tomorrow at 11:45.  If any acute symptoms (ie sob, chest pain, etc) - needs eval today.

## 2012-10-14 ENCOUNTER — Ambulatory Visit (INDEPENDENT_AMBULATORY_CARE_PROVIDER_SITE_OTHER): Payer: BC Managed Care – PPO | Admitting: Internal Medicine

## 2012-10-14 ENCOUNTER — Encounter: Payer: Self-pay | Admitting: Internal Medicine

## 2012-10-14 VITALS — BP 124/70 | HR 73 | Temp 98.3°F | Ht 65.5 in | Wt 308.5 lb

## 2012-10-14 DIAGNOSIS — I4891 Unspecified atrial fibrillation: Secondary | ICD-10-CM

## 2012-10-14 DIAGNOSIS — G4733 Obstructive sleep apnea (adult) (pediatric): Secondary | ICD-10-CM

## 2012-10-14 DIAGNOSIS — I10 Essential (primary) hypertension: Secondary | ICD-10-CM

## 2012-10-16 ENCOUNTER — Encounter: Payer: Self-pay | Admitting: Internal Medicine

## 2012-10-16 NOTE — Assessment & Plan Note (Signed)
On Flecainide now.  On Diltiazem.  No afib recently per pt.  Continue to follow up with cardiology.

## 2012-10-16 NOTE — Progress Notes (Signed)
Subjective:    Patient ID: Kristin [name], female    DOB: 02/06/1951, 62 y.o.   MRN: [id number]  Hypertension  62 year old female with past history of hypertension, hypercholesterolemia and degenerative joint disease who comes in today as a work in with concerns regarding lower extremity swelling and elevated blood pressure.   Has had issues recently with afib.  On Flecainide.  She is taking diltiazem once daily.  Seeing Dr Constance Haw.  Now being treated for her sleep apnea.  No afib (per pt) - recently. Went to [employer] last week.  Did not sleep much.  Was up on her feet a lot.  Some increased stress with keeping her grandchildren.  Noticed increased swelling in her feet.  The next day after driving home, she had to attend a funeral.  Two days after the funeral she worked [employer]  Sitting with feet hanging down.  Has felt more fatigued.  Noticed mild shortness of breath.  No chest pain or tightness.  Has been eating more salt.  Noticed blood pressure has been more elevated.  Highest reading 180/96. Has improved now.  Swelling improved now.  Feeling some better.       Past Medical History  Diagnosis Date  . Hypertension   . Hypercholesterolemia   . Fibrocystic breast disease   . Degenerative joint disease   . Atrial fibrillation     s/p knee surgery.  maintained in SR since  . GERD (gastroesophageal reflux disease)     Current Outpatient Prescriptions on File Prior to Visit  Medication Sig Dispense Refill  . aspirin 325 MG EC tablet Take 325 mg by mouth daily.      . cetirizine (ZYRTEC) 10 MG tablet Take 10 mg by mouth daily.      Marland Kitchen diltiazem (DILACOR XR) 120 MG 24 hr capsule Take 1 capsule (120 mg total) by mouth daily.  30 capsule  2  . flecainide (TAMBOCOR) 150 MG tablet Take 150 mg by mouth 2 (two) times daily.      . fluticasone (FLONASE) 50 MCG/ACT nasal spray Place 2 sprays into the nose daily.  16 g  1  . fluticasone (FLOVENT DISKUS) 50 MCG/BLIST diskus inhaler Inhale 1 puff  into the lungs 2 (two) times daily.  1 Inhaler  5  . meloxicam (MOBIC) 7.5 MG tablet Take 1 tablet (7.5 mg total) by mouth daily.  30 tablet  6  . montelukast (SINGULAIR) 10 MG tablet Take 1 tablet (10 mg total) by mouth at bedtime.  30 tablet  5  . rosuvastatin (CRESTOR) 10 MG tablet Take 1 tablet (10 mg total) by mouth daily.  30 tablet  6  . triamterene-hydrochlorothiazide (MAXZIDE-25) 37.5-25 MG per tablet Take 1 each (1 tablet total) by mouth daily.  30 tablet  6   No current facility-administered medications on file prior to visit.    Review of Systems Patient denies any headache or dizziness.  No significant sinus issues.   No chest pain, tightness or palpitations.  Minimal shortness of breath.  No afib (per patient).  No nausea or vomiting.  No acid reflux.  No abdominal pain or cramping.  No bowel change, such as diarrhea, constipation, BRBPR or melana.  No urine change.  Tolerating CPAP.  Increased stress.  Swelling better.  Blood pressure improving.       Objective:   Physical Exam  Filed Vitals:   10/14/12 1147  BP: 124/70  Pulse: 73  Temp: 98.3 F (36.8  C)   Pulse 68, blood pressure recheck:  60/12  62 year old female in no acute distress.   HEENT:  Nares- clear.  Oropharynx - without lesions. NECK:  Supple.  Nontender.  No audible bruit.  HEART:  Appears to be regular. LUNGS:  No crackles or wheezing audible.  Respirations even and unlabored.  RADIAL PULSE:  Equal bilaterally.  ABDOMEN:  Soft, nontender.  Bowel sounds present and normal.  No audible abdominal bruit.  EXTREMITIES:  No increased edema present.  DP pulses palpable and equal bilaterally.          Assessment & Plan:  LOWER EXTREMITY SWELLING.  Resolved.  Doing better.  Discussed support hose and keeping legs elevated.  Discussed decreasing salt intake.  Follow.   HEMATOLOGY.  Previous H&H increased.  Saw Dr Orlie Dakin.  Recommended no further w/up unless hgb remaining above 18.  CBC last checked since  being treated for her sleep apnea and her hgb was 15.  Follow.    GI.  Previous abdominal discomfort.  She related to increased stress.  H. Pylori negative.  She continues to decline colonoscopy.  Currently doing well with no abdominal pain.  Follow.   INCREASED PSYCHOSOCIAL STRESSORS.  Increased stress.  Discussed at length with her today.  Desires no further intervention at this time.  Follow.    HEALTH MAINTENANCE.  Physical last visit.  GI as above.  Mammogram 02/17/11 - BiRADS II.  Mammogram 03/28/12 recommended follow up views.  Follow up views 04/05/12 - Birads II.

## 2012-10-16 NOTE — Assessment & Plan Note (Addendum)
Blood pressure elevated recently.  Blood pressure appears to be doing well now.  Same medication.  Follow.  Avoid increased salt/sodium .

## 2012-10-16 NOTE — Assessment & Plan Note (Signed)
-   Using CPAP and tolerating  

## 2012-10-17 ENCOUNTER — Ambulatory Visit: Payer: BC Managed Care – PPO | Admitting: Internal Medicine

## 2012-11-24 ENCOUNTER — Other Ambulatory Visit: Payer: Self-pay | Admitting: *Deleted

## 2012-11-24 ENCOUNTER — Encounter: Payer: Self-pay | Admitting: Internal Medicine

## 2012-11-24 MED ORDER — MONTELUKAST SODIUM 10 MG PO TABS: 10.0000 mg | ORAL_TABLET | Freq: Every day | ORAL | Status: DC

## 2012-11-24 NOTE — Telephone Encounter (Signed)
Done

## 2012-11-30 ENCOUNTER — Other Ambulatory Visit: Payer: Self-pay | Admitting: *Deleted

## 2012-12-01 MED ORDER — MELOXICAM 7.5 MG PO TABS: 7.5000 mg | ORAL_TABLET | Freq: Every day | ORAL | Status: DC

## 2013-01-17 ENCOUNTER — Encounter: Payer: Self-pay | Admitting: Internal Medicine

## 2013-01-17 ENCOUNTER — Ambulatory Visit (INDEPENDENT_AMBULATORY_CARE_PROVIDER_SITE_OTHER): Payer: BC Managed Care – PPO | Admitting: Internal Medicine

## 2013-01-17 VITALS — BP 118/80 | HR 68 | Temp 98.2°F | Ht 65.5 in | Wt 318.0 lb

## 2013-01-17 DIAGNOSIS — E7800 Pure hypercholesterolemia, unspecified: Secondary | ICD-10-CM

## 2013-01-17 DIAGNOSIS — I4891 Unspecified atrial fibrillation: Secondary | ICD-10-CM

## 2013-01-17 DIAGNOSIS — Z23 Encounter for immunization: Secondary | ICD-10-CM

## 2013-01-17 DIAGNOSIS — G4733 Obstructive sleep apnea (adult) (pediatric): Secondary | ICD-10-CM

## 2013-01-17 DIAGNOSIS — K219 Gastro-esophageal reflux disease without esophagitis: Secondary | ICD-10-CM

## 2013-01-17 DIAGNOSIS — I10 Essential (primary) hypertension: Secondary | ICD-10-CM

## 2013-01-17 DIAGNOSIS — F329 Major depressive disorder, single episode, unspecified: Secondary | ICD-10-CM

## 2013-01-17 MED ORDER — SERTRALINE HCL 50 MG PO TABS: 50.0000 mg | ORAL_TABLET | Freq: Every day | ORAL | Status: DC

## 2013-01-18 ENCOUNTER — Encounter: Payer: Self-pay | Admitting: Internal Medicine

## 2013-01-18 DIAGNOSIS — F329 Major depressive disorder, single episode, unspecified: Secondary | ICD-10-CM | POA: Insufficient documentation

## 2013-01-18 NOTE — Assessment & Plan Note (Signed)
On Flecainide now.  On Diltiazem.   Continue to follow up with cardiology.  Stable.

## 2013-01-18 NOTE — Assessment & Plan Note (Signed)
Symptoms controlled.  Same meds.  Follow.   

## 2013-01-18 NOTE — Assessment & Plan Note (Signed)
Low cholesterol diet.  Follow lipid panel.  Cholesterol last checked 09/06/12 revealed total cholesterol 183, triglycerides 79, HDL 63 and LDL 104.  Remains on crestor.

## 2013-01-18 NOTE — Assessment & Plan Note (Signed)
-   Using CPAP and tolerating  

## 2013-01-18 NOTE — Assessment & Plan Note (Signed)
Does report some depression associated with fatigue.  Was questioning her medications.  We discussed this at length.  Discussed treatment options.  Check labs.  She wants to hold on counseling.  Start zoloft as directed.  Follow closely.  Get her back in soon to reassess.

## 2013-01-18 NOTE — Progress Notes (Signed)
Subjective:    Patient ID: Kristin [name], female    DOB: 03/13/1951, 62 y.o.   MRN: [id number]  HPI 62 year old female with past history of hypertension, hypercholesterolemia and degenerative joint disease who comes in today for a scheduled follow up.   Has had issues recently with afib.  On Flecainide.  She is taking diltiazem once daily.  Seeing Dr Constance Haw and Dr Gwen Pounds.  Just evaluated by Dr Gwen Pounds.  Felt from a heart standpoint things were stable.  She was questioning if some of these medications could be contributing to some fatigue and depression.  She reports she does not feel like getting out as much as she previously did.  Increased stress, but better.  Some minimal depression.  No suicidal thoughts.   Eating and drinking well.  Sometimes her mind does not shut down at night. Using her CPAP.   Breathing stable.  No nausea or vomiting.  Bowels stable.       Past Medical History  Diagnosis Date  . Hypertension   . Hypercholesterolemia   . Fibrocystic breast disease   . Degenerative joint disease   . Atrial fibrillation     s/p knee surgery.  maintained in SR since  . GERD (gastroesophageal reflux disease)     Current Outpatient Prescriptions on File Prior to Visit  Medication Sig Dispense Refill  . aspirin 325 MG EC tablet Take 325 mg by mouth daily.      . cetirizine (ZYRTEC) 10 MG tablet Take 10 mg by mouth daily.      Marland Kitchen diltiazem (DILACOR XR) 120 MG 24 hr capsule Take 1 capsule (120 mg total) by mouth daily.  30 capsule  2  . flecainide (TAMBOCOR) 150 MG tablet Take 150 mg by mouth 2 (two) times daily.      . fluticasone (FLONASE) 50 MCG/ACT nasal spray Place 2 sprays into the nose daily.  16 g  1  . fluticasone (FLOVENT DISKUS) 50 MCG/BLIST diskus inhaler Inhale 1 puff into the lungs 2 (two) times daily.  1 Inhaler  5  . meloxicam (MOBIC) 7.5 MG tablet Take 1 tablet (7.5 mg total) by mouth daily.  30 tablet  5  . montelukast (SINGULAIR) 10 MG tablet Take 1 tablet (10 mg  total) by mouth at bedtime.  30 tablet  5  . rosuvastatin (CRESTOR) 10 MG tablet Take 1 tablet (10 mg total) by mouth daily.  30 tablet  6  . triamterene-hydrochlorothiazide (MAXZIDE-25) 37.5-25 MG per tablet Take 1 each (1 tablet total) by mouth daily.  30 tablet  6   No current facility-administered medications on file prior to visit.    Review of Systems Patient denies any headache or dizziness.  No significant sinus issues.   No chest pain, tightness or palpitations currently.  She does occasionally notice some "afib episodes", but much better.  Just saw Dr Gwen Pounds.  Stable.   No increased shortness of breath.   No nausea or vomiting.  No acid reflux.  No abdominal pain or cramping.  No bowel change, such as diarrhea, constipation, BRBPR or melana.  No urine change.  Feels she is handling stress relatively well.  Using her CPAP.  Some depression and fatigue as outlined.         Objective:   Physical Exam  Filed Vitals:   01/17/13 0944  BP: 118/80  Pulse: 68  Temp: 98.2 F (36.8 C)   Blood pressure recheck:  15/53  62 year old female in  no acute distress.   HEENT:  Nares- clear.  Oropharynx - without lesions. NECK:  Supple.  Nontender.  No audible bruit.  HEART:  Appears to be regular. LUNGS:  No crackles or wheezing audible.  Respirations even and unlabored.  RADIAL PULSE:  Equal bilaterally.   ABDOMEN:  Soft, nontender.  Bowel sounds present and normal.  No audible abdominal bruit.   EXTREMITIES:  No increased edema present.  DP pulses palpable and equal bilaterally.          Assessment & Plan:  HEMATOLOGY.  Previous H&H increased.  Saw Dr Orlie Dakin.  Recommended no further w/up unless hgb remaining above 18.  CBC just checked since being treated for her sleep apnea and her hgb was 15.  Follow.    GI.  Previous abdominal discomfort.  She related to increased stress.  H. Pylori negative.  She continues to decline colonoscopy.  Currently doing well with no abdominal pain.   Follow.   INCREASED PSYCHOSOCIAL STRESSORS.  Feels she is handling things relatively well.  Follow.  HEALTH MAINTENANCE.  Physical 09/13/12.  Pap ok.  GI as above.  Mammogram 02/17/11 - BiRADS II.  Mammogram 03/28/12 recommended follow up views.  Follow up views 04/05/12 - Birads II.

## 2013-01-18 NOTE — Assessment & Plan Note (Signed)
Blood pressure appears to be doing well now.  Same medication.  Follow.  Avoid increased salt/sodium .

## 2013-01-19 ENCOUNTER — Other Ambulatory Visit (INDEPENDENT_AMBULATORY_CARE_PROVIDER_SITE_OTHER): Payer: BC Managed Care – PPO

## 2013-01-19 ENCOUNTER — Encounter: Payer: Self-pay | Admitting: Internal Medicine

## 2013-01-19 DIAGNOSIS — Z1211 Encounter for screening for malignant neoplasm of colon: Secondary | ICD-10-CM

## 2013-01-19 LAB — FECAL OCCULT BLOOD, IMMUNOCHEMICAL: Fecal Occult Bld: NEGATIVE

## 2013-03-08 ENCOUNTER — Other Ambulatory Visit (INDEPENDENT_AMBULATORY_CARE_PROVIDER_SITE_OTHER): Payer: BC Managed Care – PPO

## 2013-03-08 DIAGNOSIS — I10 Essential (primary) hypertension: Secondary | ICD-10-CM

## 2013-03-08 DIAGNOSIS — E7800 Pure hypercholesterolemia, unspecified: Secondary | ICD-10-CM

## 2013-03-08 DIAGNOSIS — G4733 Obstructive sleep apnea (adult) (pediatric): Secondary | ICD-10-CM

## 2013-03-08 LAB — HEPATIC FUNCTION PANEL
AST: 23 U/L (ref 0–37)
Alkaline Phosphatase: 78 U/L (ref 39–117)
Bilirubin, Direct: 0.1 mg/dL (ref 0.0–0.3)
Total Bilirubin: 0.5 mg/dL (ref 0.3–1.2)

## 2013-03-08 LAB — CBC WITH DIFFERENTIAL/PLATELET
Eosinophils Relative: 7 % — ABNORMAL HIGH (ref 0.0–5.0)
HCT: 49.9 % — ABNORMAL HIGH (ref 36.0–46.0)
Hemoglobin: 16.5 g/dL — ABNORMAL HIGH (ref 12.0–15.0)
Lymphs Abs: 1.3 10*3/uL (ref 0.7–4.0)
MCHC: 33.2 g/dL (ref 30.0–36.0)
MCV: 88.8 fl (ref 78.0–100.0)
Monocytes Absolute: 0.6 10*3/uL (ref 0.1–1.0)
Monocytes Relative: 9.5 % (ref 3.0–12.0)
Neutro Abs: 3.7 10*3/uL (ref 1.4–7.7)
Neutrophils Relative %: 61.5 % (ref 43.0–77.0)
Platelets: 198 10*3/uL (ref 150.0–400.0)
WBC: 6.1 10*3/uL (ref 4.5–10.5)

## 2013-03-08 LAB — BASIC METABOLIC PANEL
BUN: 22 mg/dL (ref 6–23)
CO2: 31 mEq/L (ref 19–32)
Calcium: 9.1 mg/dL (ref 8.4–10.5)
Creatinine, Ser: 1 mg/dL (ref 0.4–1.2)
Glucose, Bld: 89 mg/dL (ref 70–99)
Potassium: 4.1 mEq/L (ref 3.5–5.1)

## 2013-03-08 LAB — LIPID PANEL
LDL Cholesterol: 111 mg/dL — ABNORMAL HIGH (ref 0–99)
Total CHOL/HDL Ratio: 3

## 2013-03-10 ENCOUNTER — Encounter: Payer: Self-pay | Admitting: Internal Medicine

## 2013-03-16 ENCOUNTER — Encounter: Payer: Self-pay | Admitting: Internal Medicine

## 2013-03-16 ENCOUNTER — Ambulatory Visit (INDEPENDENT_AMBULATORY_CARE_PROVIDER_SITE_OTHER): Payer: BC Managed Care – PPO | Admitting: Internal Medicine

## 2013-03-16 VITALS — BP 130/66 | HR 75 | Temp 98.0°F | Resp 12 | Ht 65.5 in | Wt 320.5 lb

## 2013-03-16 DIAGNOSIS — I4891 Unspecified atrial fibrillation: Secondary | ICD-10-CM

## 2013-03-16 DIAGNOSIS — F329 Major depressive disorder, single episode, unspecified: Secondary | ICD-10-CM

## 2013-03-16 DIAGNOSIS — G4733 Obstructive sleep apnea (adult) (pediatric): Secondary | ICD-10-CM

## 2013-03-16 DIAGNOSIS — E7800 Pure hypercholesterolemia, unspecified: Secondary | ICD-10-CM

## 2013-03-16 DIAGNOSIS — I10 Essential (primary) hypertension: Secondary | ICD-10-CM

## 2013-03-16 DIAGNOSIS — K219 Gastro-esophageal reflux disease without esophagitis: Secondary | ICD-10-CM

## 2013-03-16 NOTE — Progress Notes (Signed)
Pre visit review using our clinic review tool, if applicable. No additional management support is needed unless otherwise documented below in the visit note. 

## 2013-03-19 ENCOUNTER — Encounter: Payer: Self-pay | Admitting: Internal Medicine

## 2013-03-19 NOTE — Assessment & Plan Note (Signed)
Symptoms controlled.  Same meds.  Follow.   

## 2013-03-19 NOTE — Assessment & Plan Note (Signed)
On zoloft.  Doing better.  Will increase zoloft to 50mg  1 1/2 tablet q day.  Follow.

## 2013-03-19 NOTE — Assessment & Plan Note (Signed)
Blood pressure appears to be doing well now.  Same medication.  Follow.  Avoid increased salt/sodium . 

## 2013-03-19 NOTE — Assessment & Plan Note (Signed)
Low cholesterol diet.  Follow lipid panel.  Cholesterol last checked 03/08/13 revealed total cholesterol 191, triglycerides 89, HDL 62 and LDL 111.  Remains on crestor.

## 2013-03-19 NOTE — Progress Notes (Signed)
Subjective:    Patient ID: Kristin [name], female    DOB: 1950-06-21, 62 y.o.   MRN: [id number]  HPI 61 year old female with past history of hypertension, hypercholesterolemia and degenerative joint disease who comes in today for a scheduled follow up.   Has had issues recently with afib.  On Flecainide.  She is taking diltiazem once daily.  Seeing Dr Constance Haw and Dr Gwen Pounds.  Feels from a heart standpoint things are stable.   Eating and drinking well.   Had been using CPAP.   Had a root canal recently and has not been able to use the CPAP recently.  Plans to restart.  Breathing stable.  No nausea or vomiting.  Bowels stable.  Feels better on the Zoloft.  Feels she may need to increase the dose some.  Overall feels things are stable.     Past Medical History  Diagnosis Date  . Hypertension   . Hypercholesterolemia   . Fibrocystic breast disease   . Degenerative joint disease   . Atrial fibrillation     s/p knee surgery.  maintained in SR since  . GERD (gastroesophageal reflux disease)     Current Outpatient Prescriptions on File Prior to Visit  Medication Sig Dispense Refill  . aspirin 325 MG EC tablet Take 325 mg by mouth daily.      . cetirizine (ZYRTEC) 10 MG tablet Take 10 mg by mouth daily.      Marland Kitchen diltiazem (DILACOR XR) 120 MG 24 hr capsule Take 1 capsule (120 mg total) by mouth daily.  30 capsule  2  . flecainide (TAMBOCOR) 150 MG tablet Take 150 mg by mouth 2 (two) times daily.      . fluticasone (FLONASE) 50 MCG/ACT nasal spray Place 2 sprays into the nose daily.  16 g  1  . fluticasone (FLOVENT DISKUS) 50 MCG/BLIST diskus inhaler Inhale 1 puff into the lungs 2 (two) times daily.  1 Inhaler  5  . meloxicam (MOBIC) 7.5 MG tablet Take 1 tablet (7.5 mg total) by mouth daily.  30 tablet  5  . montelukast (SINGULAIR) 10 MG tablet Take 1 tablet (10 mg total) by mouth at bedtime.  30 tablet  5  . rosuvastatin (CRESTOR) 10 MG tablet Take 1 tablet (10 mg total) by mouth daily.  30  tablet  6  . sertraline (ZOLOFT) 50 MG tablet Take 1 tablet (50 mg total) by mouth daily.  30 tablet  2  . triamterene-hydrochlorothiazide (MAXZIDE-25) 37.5-25 MG per tablet Take 1 each (1 tablet total) by mouth daily.  30 tablet  6   No current facility-administered medications on file prior to visit.    Review of Systems Patient denies any headache or dizziness.  No significant sinus issues.   No chest pain, tightness or palpitations currently. No increased shortness of breath.   No nausea or vomiting.  No acid reflux.  No abdominal pain or cramping.  No bowel change, such as diarrhea, constipation, BRBPR or melana.  No urine change.  Feels she is handling stress relatively well. Feels the zoloft is working well.  Feels she may need to increase the dose some.  Plans to restart the CPAP regularly.          Objective:   Physical Exam  Filed Vitals:   03/16/13 1136  BP: 130/66  Pulse: 75  Temp: 98 F (36.7 C)  Resp: 18   62 year old female in no acute distress.   HEENT:  Nares-  clear.  Oropharynx - without lesions. NECK:  Supple.  Nontender.  No audible bruit.  HEART:  Appears to be regular. LUNGS:  No crackles or wheezing audible.  Respirations even and unlabored.  RADIAL PULSE:  Equal bilaterally.   ABDOMEN:  Soft, nontender.  Bowel sounds present and normal.  No audible abdominal bruit.   EXTREMITIES:  No increased edema present.  DP pulses palpable and equal bilaterally.          Assessment & Plan:  HEMATOLOGY.  Previous H&H increased.  Saw Dr Orlie Dakin.  Recommended no further w/up unless hgb remaining above 18.  Hgb has improved with the use of her CPAP.  Follow.    GI.  Previous abdominal discomfort.  She related to increased stress.  H. Pylori negative.  She continues to decline colonoscopy.  Currently doing well with no abdominal pain.  Follow.   INCREASED PSYCHOSOCIAL STRESSORS.  Feels she is handling things relatively well.  Follow.  Will increase zoloft to 50mg  1 1/2  tablet q day.    HEALTH MAINTENANCE.  Physical 09/13/12.  Pap ok.  GI as above.  Mammogram 02/17/11 - BiRADS II.  Mammogram 03/28/12 recommended follow up views.  Follow up views 04/05/12 - Birads II.  Schedule a f/u mammogram.    I spent 25 minutes with the patient and more than 50% of the time was spent in consultation regarding the above.

## 2013-03-19 NOTE — Assessment & Plan Note (Signed)
On Flecainide now.  On Diltiazem.   Continue to follow up with cardiology.  Stable.  

## 2013-03-19 NOTE — Assessment & Plan Note (Signed)
Plans to restart CPAP.  Follow.

## 2013-03-20 ENCOUNTER — Other Ambulatory Visit: Payer: Self-pay | Admitting: Internal Medicine

## 2013-03-27 ENCOUNTER — Other Ambulatory Visit: Payer: Self-pay | Admitting: *Deleted

## 2013-03-27 MED ORDER — SERTRALINE HCL 50 MG PO TABS: 75.0000 mg | ORAL_TABLET | Freq: Every day | ORAL | Status: DC

## 2013-05-12 ENCOUNTER — Ambulatory Visit (INDEPENDENT_AMBULATORY_CARE_PROVIDER_SITE_OTHER): Payer: BC Managed Care – PPO | Admitting: Internal Medicine

## 2013-05-12 ENCOUNTER — Encounter: Payer: Self-pay | Admitting: Internal Medicine

## 2013-05-12 ENCOUNTER — Telehealth: Payer: Self-pay | Admitting: Internal Medicine

## 2013-05-12 VITALS — BP 138/82 | HR 87 | Temp 100.0°F | Resp 18 | Wt 314.0 lb

## 2013-05-12 DIAGNOSIS — J111 Influenza due to unidentified influenza virus with other respiratory manifestations: Secondary | ICD-10-CM

## 2013-05-12 DIAGNOSIS — R6889 Other general symptoms and signs: Secondary | ICD-10-CM

## 2013-05-12 LAB — POCT INFLUENZA A/B
INFLUENZA A, POC: POSITIVE
INFLUENZA B, POC: POSITIVE

## 2013-05-12 MED ORDER — OSELTAMIVIR PHOSPHATE 75 MG PO CAPS: 75.0000 mg | ORAL_CAPSULE | Freq: Two times a day (BID) | ORAL | Status: DC

## 2013-05-12 MED ORDER — HYDROCOD POLST-CHLORPHEN POLST 10-8 MG/5ML PO LQCR: 5.0000 mL | Freq: Two times a day (BID) | ORAL | Status: DC | PRN

## 2013-05-12 NOTE — Progress Notes (Signed)
Patient ID: Kristin [name], female   DOB: 05/29/1950, 63 y.o.   MRN: [id number]  Patient Active Problem List   Diagnosis Date Noted  . Influenza with respiratory manifestations 05/14/2013  . Depression 01/18/2013  . Obstructive sleep apnea 09/13/2012  . Atrial fibrillation 03/08/2012  . GERD (gastroesophageal reflux disease) 03/08/2012  . Hypercholesterolemia 03/08/2012  . Hypertension 03/08/2012    Subjective:  CC:   Chief Complaint  Patient presents with  . Acute Visit    HPI:   Kristin [name] is a 63 y.o. female who presents for  Cough, fatigue,  Low grade fevers.  SYMPTOMS STARTED TWO days ago with fatigue and cough. terrible cough , tried delsym  No help., then tried robitussin ac no help at all. Cough is worse with supine position. No sick contacts.  Has been wearing a mask around her grandchildren since onset of symptoms.    Past Medical History  Diagnosis Date  . Hypertension   . Hypercholesterolemia   . Fibrocystic breast disease   . Degenerative joint disease   . Atrial fibrillation     s/p knee surgery.  maintained in SR since  . GERD (gastroesophageal reflux disease)     Past Surgical History  Procedure Laterality Date  . Tonsillectomy    . Cholecystectomy  2003  . Replacement total knee  2004    Dr Erin [facility]  . Replacement total knee  12/08    left  . 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    Review of Systems:   Patient denies headache, fevers, malaise, unintentional weight loss, skin rash, eye pain, sinus congestion and sinus pain, sore throat, dysphagia,  hemoptysis , cough, dyspnea, wheezing, chest pain, palpitations, orthopnea, edema, abdominal pain, nausea, melena, diarrhea, constipation, flank pain, dysuria, hematuria, urinary  Frequency, nocturia, numbness, tingling, seizures,  Focal weakness,  Loss of consciousness,  Tremor, insomnia, depression, anxiety, and suicidal ideation.     History   Social History  . Marital Status: Married    Spouse Name: N/A    Number of Children: 3  . Years of Education: N/A   Occupational History  . Not on file.   Social History Main Topics  . Smoking status: Former Smoker    Quit date: 03/31/1983  . Smokeless tobacco: Never Used  . Alcohol Use: Yes     Comment: occasional  . Drug Use: No  . Sexual Activity: Not on file   Other Topics Concern  . Not on file   Social History Narrative  . No narrative on file    Objective:  Filed Vitals:   05/12/13 1329  BP: 138/82  Pulse: 87  Temp: 100 F (37.8 C)  Resp: 18     General appearance: alert, cooperative and appears stated age Ears: normal TM's and external ear canals both ears Throat: lips, mucosa, and tongue normal; teeth and gums normal Neck: no adenopathy, no carotid bruit, supple, symmetrical, trachea midline and thyroid not enlarged, symmetric, no tenderness/mass/nodules Back: symmetric, no curvature. ROM normal. No CVA tenderness. Lungs: clear to auscultation bilaterally Heart: regular rate and rhythm, S1, S2 normal, no murmur, click, rub or gallop Abdomen: soft, non-tender; bowel sounds normal; no masses,  no organomegaly Pulses: 2+ and symmetric Skin: Skin color, texture, turgor normal. No rashes or lesions Lymph nodes: Cervical, supraclavicular, and axillary nodes normal.  Assessment and Plan:  Influenza  with respiratory manifestations Symptoms started < 48 hours ago ,  Tamiflu prescribed.  No respiratory distress or sinusitis on exam. Respiratory precautions advised. Advised her to have husband start prophylaxis as well.    Updated Medication List Outpatient Encounter Prescriptions as of 05/12/2013  Medication Sig  . aspirin 325 MG EC tablet Take 325 mg by mouth daily.  . cetirizine (ZYRTEC) 10 MG tablet Take 10 mg by mouth daily.  . CRESTOR 10 MG tablet  TAKE 1 TABLET EVERY DAY  . diltiazem (DILACOR XR) 120 MG 24 hr capsule Take 1 capsule (120 mg total) by mouth daily.  . flecainide (TAMBOCOR) 150 MG tablet Take 150 mg by mouth 2 (two) times daily.  . fluticasone (FLONASE) 50 MCG/ACT nasal spray Place 2 sprays into the nose daily.  . fluticasone (FLOVENT DISKUS) 50 MCG/BLIST diskus inhaler Inhale 1 puff into the lungs 2 (two) times daily.  . meloxicam (MOBIC) 7.5 MG tablet Take 1 tablet (7.5 mg total) by mouth daily.  . montelukast (SINGULAIR) 10 MG tablet Take 1 tablet (10 mg total) by mouth at bedtime.  . sertraline (ZOLOFT) 50 MG tablet Take 1.5 tablets (75 mg total) by mouth daily.  Marland Kitchen triamterene-hydrochlorothiazide (MAXZIDE-25) 37.5-25 MG per tablet Take 1 each (1 tablet total) by mouth daily.  . chlorpheniramine-HYDROcodone (TUSSIONEX) 10-8 MG/5ML LQCR Take 5 mLs by mouth every 12 (twelve) hours as needed for cough.  Marland Kitchen oseltamivir (TAMIFLU) 75 MG capsule Take 1 capsule (75 mg total) by mouth 2 (two) times daily.     Orders Placed This Encounter  Procedures  . POCT Influenza A/B    No Follow-up on file.

## 2013-05-12 NOTE — Telephone Encounter (Signed)
Patient Information:  Caller Name: Earley AbideHilda  Phone: [phone]  Patient: Kristin [name], Kristin [name]  Gender: Female  DOB: 01/24/1951  Age: 63 Years  PCP: Dale DurhamScott, Charlene  Office Follow Up:  Does the office need to follow up with this patient?: No  Instructions For The Office: N/A   Symptoms  Reason For Call & Symptoms: pt calling that she has cough and sinus congestion.  She has Atrial Fib an has gone into this with coughing.  States she has some cough medicine with Codiene but none seen in her EPIC chart.  Reviewed Health History In EMR: Yes  Reviewed Medications In EMR: Yes  Reviewed Allergies In EMR: Yes  Reviewed Surgeries / Procedures: Yes  Date of Onset of Symptoms: 05/10/2013  Treatments Tried: Cough syrup with codiene, steamy bathroom.  Treatments Tried Worked: No  Guideline(s) Used:  Cough  Disposition Per Guideline:   See Today in Office  Reason For Disposition Reached:   Severe coughing spells (e.g., whooping sound after coughing, vomiting after coughing)  Advice Given:  N/A  Patient Will Follow Care Advice:  YES  Appointment Scheduled:  05/12/2013 13:30:00 Appointment Scheduled Provider:  Duncan Dullullo, Teresa (Adults only)

## 2013-05-12 NOTE — Patient Instructions (Addendum)
Your flu test was positive.  I am treating you with tamiflu and tussionex starting today  Advise your husband to ask his doctor for a tamiflu prophylaxis dose since he has been exposed.  You can take tylenol  Every 6 hours as needed for fever.  You should be at Tamiflu for 48 hours and feeling better (no fever) before returning to work or play  Influenza, Adult Influenza ("the flu") is a viral infection of the respiratory tract. It occurs more often in winter months because people spend more time in close contact with one another. Influenza can make you feel very sick. Influenza easily spreads from person to person (contagious). CAUSES  Influenza is caused by a virus that infects the respiratory tract. You can catch the virus by breathing in droplets from an infected person's cough or sneeze. You can also catch the virus by touching something that was recently contaminated with the virus and then touching your mouth, nose, or eyes. SYMPTOMS  Symptoms typically last 4 to 10 days and may include:  Fever.  Chills.  Headache, body aches, and muscle aches.  Sore throat.  Chest discomfort and cough.  Poor appetite.  Weakness or feeling tired.  Dizziness.  Nausea or vomiting. DIAGNOSIS  Diagnosis of influenza is often made based on your history and a physical exam. A nose or throat swab test can be done to confirm the diagnosis. RISKS AND COMPLICATIONS You may be at risk for a more severe case of influenza if you smoke cigarettes, have diabetes, have chronic heart disease (such as heart failure) or lung disease (such as asthma), or if you have a weakened immune system. Elderly people and pregnant women are also at risk for more serious infections. The most common complication of influenza is a lung infection (pneumonia). Sometimes, this complication can require emergency medical care and may be life-threatening. PREVENTION  An annual influenza vaccination (flu shot) is the best way to  avoid getting influenza. An annual flu shot is now routinely recommended for all adults in the [place] TREATMENT  In mild cases, influenza goes away on its own. Treatment is directed at relieving symptoms. For more severe cases, your caregiver may prescribe antiviral medicines to shorten the sickness. Antibiotic medicines are not effective, because the infection is caused by a virus, not by bacteria. HOME CARE INSTRUCTIONS  Only take over-the-counter or prescription medicines for pain, discomfort, or fever as directed by your caregiver.  Use a cool mist humidifier to make breathing easier.  Get plenty of rest until your temperature returns to normal. This usually takes 3 to 4 days.  Drink enough fluids to keep your urine clear or pale yellow.  Cover your mouth and nose when coughing or sneezing, and wash your hands well to avoid spreading the virus.  Stay home from work or school until your fever has been gone for at least 1 full day. SEEK MEDICAL CARE IF:   You have chest pain or a deep cough that worsens or produces more mucus.  You have nausea, vomiting, or diarrhea. SEEK IMMEDIATE MEDICAL CARE IF:   You have difficulty breathing, shortness of breath, or your skin or nails turn bluish.  You have severe neck pain or stiffness.  You have a severe headache, facial pain, or earache.  You have a worsening or recurring fever.  You have nausea or vomiting that cannot be controlled. MAKE SURE YOU:  Understand these instructions.  Will watch your condition.  Will get help right away if  you are not doing well or get worse. Document Released: 03/13/2000 Document Revised: 09/15/2011 Document Reviewed: 06/15/2011 [facility] Patient Information 2014 [place], [place].

## 2013-05-12 NOTE — Telephone Encounter (Signed)
Fyi we are seeing at 1.30

## 2013-05-14 DIAGNOSIS — J111 Influenza due to unidentified influenza virus with other respiratory manifestations: Secondary | ICD-10-CM | POA: Insufficient documentation

## 2013-05-14 NOTE — Assessment & Plan Note (Signed)
Symptoms started < 48 hours ago ,  Tamiflu prescribed.  No respiratory distress or sinusitis on exam. Respiratory precautions advised. Advised her to have husband start prophylaxis as well.

## 2013-05-15 ENCOUNTER — Telehealth: Payer: Self-pay | Admitting: Internal Medicine

## 2013-05-15 NOTE — Telephone Encounter (Signed)
Pt calling to check status of previous msg.  Asking for advice.

## 2013-05-15 NOTE — Telephone Encounter (Signed)
Noted.  Just let us know if she needs anything.

## 2013-05-15 NOTE — Telephone Encounter (Signed)
Pt states, that she wanted something to help with the cough. She says that the Tussionex is not helping much at all. And she feels like her lungs are tight. Cough non-productive as mentioned before.

## 2013-05-15 NOTE — Telephone Encounter (Signed)
If lungs are tight, then the cough is probably related to some inflammation and spasm in the airway.  She may need inhaler, etc.  Probably needs to be reevaluated if tight.  Also, would recommend Mucinex DM (if she can take) or robitussin DM - this should loosen up the mucus.  If worsening symptoms tonight (worsening tightness) - needs eval.

## 2013-05-15 NOTE — Telephone Encounter (Signed)
Pt was seen by Dr. Darrick Huntsmanullo on 2/13, and tested positive for flu & treated

## 2013-05-15 NOTE — Telephone Encounter (Signed)
Pt.notified

## 2013-05-15 NOTE — Telephone Encounter (Signed)
The patient does not have a fever , not as achy ,but she still has a dry cough non productive.

## 2013-05-17 NOTE — Telephone Encounter (Signed)
Pt called asking to speak with Dr. Lorin PicketScott.  Would like Dr. Lorin PicketScott to call her, she does not care what time.  States she is going into AFib with all the coughing.  Does not care if it is after Dr. Lorin PicketScott gets off work, but state she has to talk with Dr. Lorin PicketScott.

## 2013-05-17 NOTE — Telephone Encounter (Signed)
Appointment made

## 2013-05-17 NOTE — Telephone Encounter (Signed)
Please put on schedule for tomorrow (05/18/13)  at 1:00

## 2013-05-18 ENCOUNTER — Ambulatory Visit (INDEPENDENT_AMBULATORY_CARE_PROVIDER_SITE_OTHER)
Admission: RE | Admit: 2013-05-18 | Discharge: 2013-05-18 | Disposition: A | Discharge status: Home / Self Care | Payer: BC Managed Care – PPO | Source: Ambulatory Visit | Attending: Internal Medicine | Admitting: Internal Medicine

## 2013-05-18 ENCOUNTER — Encounter: Payer: Self-pay | Admitting: Internal Medicine

## 2013-05-18 ENCOUNTER — Ambulatory Visit (INDEPENDENT_AMBULATORY_CARE_PROVIDER_SITE_OTHER): Payer: BC Managed Care – PPO | Admitting: Internal Medicine

## 2013-05-18 VITALS — BP 110/70 | HR 79 | Temp 98.3°F | Ht 65.5 in | Wt 310.5 lb

## 2013-05-18 DIAGNOSIS — J111 Influenza due to unidentified influenza virus with other respiratory manifestations: Secondary | ICD-10-CM

## 2013-05-18 DIAGNOSIS — I4891 Unspecified atrial fibrillation: Secondary | ICD-10-CM

## 2013-05-18 DIAGNOSIS — R059 Cough, unspecified: Secondary | ICD-10-CM

## 2013-05-18 DIAGNOSIS — R05 Cough: Secondary | ICD-10-CM

## 2013-05-18 DIAGNOSIS — R0989 Other specified symptoms and signs involving the circulatory and respiratory systems: Secondary | ICD-10-CM

## 2013-05-18 DIAGNOSIS — R062 Wheezing: Secondary | ICD-10-CM

## 2013-05-18 MED ORDER — PREDNISONE 10 MG PO TABS: ORAL_TABLET | ORAL | Status: DC

## 2013-05-18 MED ORDER — ALBUTEROL SULFATE (2.5 MG/3ML) 0.083% IN NEBU
2.5000 mg | INHALATION_SOLUTION | Freq: Once | RESPIRATORY_TRACT | Status: AC
  Administered 2013-05-18: 2.5 mg via RESPIRATORY_TRACT

## 2013-05-18 MED ORDER — CEFDINIR 300 MG PO CAPS: 300.0000 mg | ORAL_CAPSULE | Freq: Two times a day (BID) | ORAL | Status: DC

## 2013-05-18 MED ORDER — ALBUTEROL SULFATE HFA 108 (90 BASE) MCG/ACT IN AERS: 2.0000 | INHALATION_SPRAY | Freq: Four times a day (QID) | RESPIRATORY_TRACT | Status: DC | PRN

## 2013-05-18 NOTE — Progress Notes (Signed)
Subjective:    Patient ID: Kristin [name], female    DOB: [birth year], 63 y.o.   MRN: [id number]  Cough  Atrial Fibrillation Past medical history includes atrial fibrillation.  63 year old female with past history of hypertension, hypercholesterolemia and degenerative joint disease who comes in today as a work in with concerns regarding persistent cough.  Was evaluated last week by Dr Darrick Huntsman.  Diagnosed with the flu.  Took Tamilfu.   She presents today with persistent and increased cough and congestion.  Some wheezing.  Fatigue.  Persistent low grade fever on Tylenol.  No vomiting.  Decreased appetite.  Is eating some.  Drinking fluids.  No chest pain, but does report soreness from coughing.  Some drainage.  No sore throat.      Past Medical History  Diagnosis Date  . Hypertension   . Hypercholesterolemia   . Fibrocystic breast disease   . Degenerative joint disease   . Atrial fibrillation     s/p knee surgery.  maintained in SR since  . GERD (gastroesophageal reflux disease)     Current Outpatient Prescriptions on File Prior to Visit  Medication Sig Dispense Refill  . aspirin 325 MG EC tablet Take 325 mg by mouth daily.      . cetirizine (ZYRTEC) 10 MG tablet Take 10 mg by mouth daily.      . chlorpheniramine-HYDROcodone (TUSSIONEX) 10-8 MG/5ML LQCR Take 5 mLs by mouth every 12 (twelve) hours as needed for cough.  250 mL  0  . CRESTOR 10 MG tablet TAKE 1 TABLET EVERY DAY  30 tablet  5  . diltiazem (DILACOR XR) 120 MG 24 hr capsule Take 1 capsule (120 mg total) by mouth daily.  30 capsule  2  . flecainide (TAMBOCOR) 150 MG tablet Take 150 mg by mouth 2 (two) times daily.      . fluticasone (FLONASE) 50 MCG/ACT nasal spray Place 2 sprays into the nose daily.  16 g  1  . fluticasone (FLOVENT DISKUS) 50 MCG/BLIST diskus inhaler Inhale 1 puff into the lungs 2 (two) times daily.  1 Inhaler  5  . meloxicam (MOBIC) 7.5 MG tablet Take 1 tablet (7.5 mg total) by mouth daily.  30 tablet  5  .  montelukast (SINGULAIR) 10 MG tablet Take 1 tablet (10 mg total) by mouth at bedtime.  30 tablet  5  . sertraline (ZOLOFT) 50 MG tablet Take 1.5 tablets (75 mg total) by mouth daily.  30 tablet  2  . triamterene-hydrochlorothiazide (MAXZIDE-25) 37.5-25 MG per tablet Take 1 each (1 tablet total) by mouth daily.  30 tablet  6   No current facility-administered medications on file prior to visit.    Review of Systems  Respiratory: Positive for cough.   Patient denies any headache or dizziness.  No significant sinus issues.  Does report some drainage.  No significant headache.   No chest pain, tightness.  Was questioning if was in afib.   Increased cough.  Feels congestion in her chest.  Cough mostly nonproductive.   She had one episode of emesis this am after brushing her teeth.  No other vomiting.  Decreased appetite.  Tolerating po's.  Drinking.   No abdominal pain or cramping.  Some loose stool.         Objective:   Physical Exam  Filed Vitals:   05/18/13 1301  BP: 110/70  Pulse: 82  Temp: 98.3 F (36.8 C)   Pulse ox initially 89-91%.  Post neb pulse ox  6294%.   63 year old female in no acute distress.   HEENT:  Nares- slightly erythematous turbinates.   Oropharynx - without lesions.  No significant tenderness to palpation over the sinus.   NECK:  Supple.  Nontender.   HEART:  Appears to be regular.  LUNGS:  Some increased congestion.  Clears some with coughing.  Some wheezing.  Congestion more localized to the right lung.   RADIAL PULSE:  Equal bilaterally.          Assessment & Plan:  HEALTH MAINTENANCE.  Physical 09/13/12.  Pap ok.  GI as above.  Mammogram 02/17/11 - BiRADS II.  Mammogram 03/28/12 recommended follow up views.  Follow up views 04/05/12 - Birads II.  Schedule a f/u mammogram.    I spent 25 minutes with the patient and more than 50% of the time was spent in consultation regarding the above.

## 2013-05-18 NOTE — Patient Instructions (Signed)
Robitussin DM in the am and in the evening.  Keep nose flushed.

## 2013-05-19 ENCOUNTER — Encounter: Payer: Self-pay | Admitting: Internal Medicine

## 2013-05-19 ENCOUNTER — Encounter: Payer: Self-pay | Admitting: *Deleted

## 2013-05-19 ENCOUNTER — Other Ambulatory Visit: Payer: Self-pay | Admitting: *Deleted

## 2013-05-19 MED ORDER — AMOXICILLIN-POT CLAVULANATE 875-125 MG PO TABS: 1.0000 | ORAL_TABLET | Freq: Two times a day (BID) | ORAL | Status: DC

## 2013-05-19 NOTE — Assessment & Plan Note (Signed)
Diagnosed recently with the flu.  Treated with Tamiflu.  With increased cough and congestion.  Exam as outlined.  Albuterol neb given here in the office.  Tolerated.  Increased air movement post neb.  Pulse ox increased to 94% post neb.   Will check cxr.  Treat with omnicef as directed.  Robitussin DM as directed.  Prednisone taper starting at 60mg  and decreasing by 5mg  each day until off.  Albuterol inhaler as directed.  Follow.  Will need to call with update in the next few days.  Any worsening change in symptoms or problems, will need to be reevaluated.

## 2013-05-19 NOTE — Telephone Encounter (Signed)
[facility] & mychart message sent to patient. Discontinued Omnicef & changed to Augmentin 875mg  BID

## 2013-05-19 NOTE — Assessment & Plan Note (Signed)
On Flecainide now.  On Diltiazem.   Continue to follow up with cardiology.  Appears to be in SR today.  Follow.

## 2013-05-22 ENCOUNTER — Encounter: Payer: Self-pay | Admitting: Internal Medicine

## 2013-05-23 ENCOUNTER — Ambulatory Visit: Payer: BC Managed Care – PPO | Admitting: Internal Medicine

## 2013-05-23 NOTE — Telephone Encounter (Signed)
Pt called to report that Dr. Gwen PoundsKowalski office is closed today. Her breathing is much better today. Her daughter Banker(RN) feels that she needs some labs drawn also. I have put her on the schedule for tomorrow at 2:30 and told her to be here by 2:15.

## 2013-05-24 ENCOUNTER — Encounter: Payer: Self-pay | Admitting: Internal Medicine

## 2013-05-24 ENCOUNTER — Ambulatory Visit (INDEPENDENT_AMBULATORY_CARE_PROVIDER_SITE_OTHER): Payer: BC Managed Care – PPO | Admitting: Internal Medicine

## 2013-05-24 VITALS — BP 120/78 | HR 80 | Temp 97.9°F | Ht 65.5 in | Wt 311.0 lb

## 2013-05-24 DIAGNOSIS — R5383 Other fatigue: Secondary | ICD-10-CM

## 2013-05-24 DIAGNOSIS — I498 Other specified cardiac arrhythmias: Secondary | ICD-10-CM

## 2013-05-24 DIAGNOSIS — G4733 Obstructive sleep apnea (adult) (pediatric): Secondary | ICD-10-CM

## 2013-05-24 DIAGNOSIS — R5381 Other malaise: Secondary | ICD-10-CM

## 2013-05-24 DIAGNOSIS — R001 Bradycardia, unspecified: Secondary | ICD-10-CM

## 2013-05-24 DIAGNOSIS — I10 Essential (primary) hypertension: Secondary | ICD-10-CM

## 2013-05-24 DIAGNOSIS — J189 Pneumonia, unspecified organism: Secondary | ICD-10-CM

## 2013-05-24 DIAGNOSIS — I4891 Unspecified atrial fibrillation: Secondary | ICD-10-CM

## 2013-05-24 MED ORDER — DILTIAZEM HCL 30 MG PO TABS: 30.0000 mg | ORAL_TABLET | Freq: Two times a day (BID) | ORAL | Status: DC

## 2013-05-25 ENCOUNTER — Other Ambulatory Visit: Payer: Self-pay | Admitting: Internal Medicine

## 2013-05-25 ENCOUNTER — Encounter: Payer: Self-pay | Admitting: Internal Medicine

## 2013-05-25 DIAGNOSIS — J189 Pneumonia, unspecified organism: Secondary | ICD-10-CM | POA: Insufficient documentation

## 2013-05-25 DIAGNOSIS — R001 Bradycardia, unspecified: Secondary | ICD-10-CM | POA: Insufficient documentation

## 2013-05-25 LAB — COMPREHENSIVE METABOLIC PANEL
ALT: 25 U/L (ref 0–35)
AST: 14 U/L (ref 0–37)
Albumin: 3.6 g/dL (ref 3.5–5.2)
Alkaline Phosphatase: 61 U/L (ref 39–117)
BUN: 21 mg/dL (ref 6–23)
CHLORIDE: 99 meq/L (ref 96–112)
CO2: 28 mEq/L (ref 19–32)
CREATININE: 1.16 mg/dL — AB (ref 0.50–1.10)
Calcium: 9.2 mg/dL (ref 8.4–10.5)
GLUCOSE: 64 mg/dL — AB (ref 70–99)
Potassium: 6.5 mEq/L (ref 3.5–5.3)
Sodium: 139 mEq/L (ref 135–145)
Total Bilirubin: 0.3 mg/dL (ref 0.2–1.2)
Total Protein: 6.7 g/dL (ref 6.0–8.3)

## 2013-05-25 LAB — CBC WITH DIFFERENTIAL/PLATELET
Basophils Absolute: 0 10*3/uL (ref 0.0–0.1)
Basophils Relative: 0 % (ref 0–1)
Eosinophils Absolute: 0 10*3/uL (ref 0.0–0.7)
Eosinophils Relative: 0 % (ref 0–5)
HEMATOCRIT: 49.4 % — AB (ref 36.0–46.0)
HEMOGLOBIN: 16.9 g/dL — AB (ref 12.0–15.0)
LYMPHS PCT: 10 % — AB (ref 12–46)
Lymphs Abs: 0.9 10*3/uL (ref 0.7–4.0)
MCH: 29.8 pg (ref 26.0–34.0)
MCHC: 34.2 g/dL (ref 30.0–36.0)
MCV: 87.1 fL (ref 78.0–100.0)
MONO ABS: 0.5 10*3/uL (ref 0.1–1.0)
MONOS PCT: 5 % (ref 3–12)
Neutro Abs: 7.8 10*3/uL — ABNORMAL HIGH (ref 1.7–7.7)
Neutrophils Relative %: 85 % — ABNORMAL HIGH (ref 43–77)
Platelets: 361 10*3/uL (ref 150–400)
RBC: 5.67 MIL/uL — ABNORMAL HIGH (ref 3.87–5.11)
RDW: 13.9 % (ref 11.5–15.5)
WBC: 9.2 10*3/uL (ref 4.0–10.5)

## 2013-05-25 LAB — POTASSIUM: Potassium: 4.8 mmol/L (ref 3.5–5.1)

## 2013-05-25 NOTE — Assessment & Plan Note (Signed)
In SR today.  Pulse rate in the 60s.  EKG with no evidence of heart block.  She has stopped her diltiazem and flecainide.  Will restart the flecainide at her previous dose.  Will restart diltiazem, but will give her 30mg  of short acting diltiazem to take bid.  Monitor pulse rate and blood pressure.  Adjust her medications as outlined.  Follow up with cardiology as planned.  They are going to see her tomorrow or the next day - pending the weather.  She was instructed if symptoms change, worsen or do not resolve - she is to be reevaluated immediately.

## 2013-05-25 NOTE — Assessment & Plan Note (Signed)
Diagnosed recently with pneumonia.  On augmentin, prednisone taper and albuterol inhaler.  Breathing better.  Lungs with increased air movement.  Cough better.  Continue treatment as outlined in previous note.  Follow.  Will need cxr in a few weeks to confirm clearance.

## 2013-05-25 NOTE — Assessment & Plan Note (Signed)
Back using her CPAP now.  Follow.    

## 2013-05-25 NOTE — Assessment & Plan Note (Signed)
Per pt and her daughter's report, her heart rate was in the 40s.  She is now off her diltiazem and flecainide.  Will restart her flecainide as outlined.  Decrease diltiazem to 30mg  bid.  Follow heart rate and blood pressure as outlined. See cardiology as outlined.

## 2013-05-25 NOTE — Assessment & Plan Note (Signed)
Blood pressure doing well.  Follow.  Adjust meds as outlined.

## 2013-05-25 NOTE — Progress Notes (Signed)
Subjective:    Patient ID: Kristin [name], female    DOB: [birth year], 63 y.o.   MRN: [id number]  Cough  Atrial Fibrillation Past medical history includes atrial fibrillation.  63 year old female with past history of hypertension, hypercholesterolemia and degenerative joint disease who comes in today as a work in with concerns regarding some persistent cough, fatigue and bradycardia.  She is accompanied by her daughter (who is an ICU nurse).  History obtained from both of them.   Was evaluated two weeks ago by Dr Darrick Huntsman.  Diagnosed with the flu.  Took Tamilfu.   She presented last week with persistent and increased cough and congestion.  Some wheezing.  Fatigue.  Persistent low grade fever on Tylenol.  No vomiting.  Decreased appetite.  CXR revealed pneumonia.  She has been on Augmentin and prednisone since.  Breathing is better.  She has been having problems with bradycardia.  She has known atrial fibrillation.  Has been followed by cardiology and an electrophysiologist and has been maintained on Flecainide and diltiazem.  Her daughter instructed her to stop her Flecainide and her diltiazem when her heart rate was in the 40s.  She has been off now for two weeks.  Still has the increased cough, but is better.  Breathing is better.  Able to take a good deep breath.  Eating and drinking.  No nausea or vomiting.  Some minimal diarrhea.  Pulse ox at home has been running 89-92% previously.  Better today.  Has felt better the last two days.      Past Medical History  Diagnosis Date  . Hypertension   . Hypercholesterolemia   . Fibrocystic breast disease   . Degenerative joint disease   . Atrial fibrillation     s/p knee surgery.  maintained in SR since  . GERD (gastroesophageal reflux disease)     Current Outpatient Prescriptions on File Prior to Visit  Medication Sig Dispense Refill  . albuterol (PROVENTIL HFA;VENTOLIN HFA) 108 (90 BASE) MCG/ACT inhaler Inhale 2 puffs into the lungs every 6 (six)  hours as needed for wheezing or shortness of breath.  1 Inhaler  0  . amoxicillin-clavulanate (AUGMENTIN) 875-125 MG per tablet Take 1 tablet by mouth 2 (two) times daily.  20 tablet  0  . aspirin 325 MG EC tablet Take 325 mg by mouth daily.      . cetirizine (ZYRTEC) 10 MG tablet Take 10 mg by mouth daily.      . chlorpheniramine-HYDROcodone (TUSSIONEX) 10-8 MG/5ML LQCR Take 5 mLs by mouth every 12 (twelve) hours as needed for cough.  250 mL  0  . CRESTOR 10 MG tablet TAKE 1 TABLET EVERY DAY  30 tablet  5  . flecainide (TAMBOCOR) 150 MG tablet Take 150 mg by mouth 2 (two) times daily.      . fluticasone (FLONASE) 50 MCG/ACT nasal spray Place 2 sprays into the nose daily.  16 g  1  . fluticasone (FLOVENT DISKUS) 50 MCG/BLIST diskus inhaler Inhale 1 puff into the lungs 2 (two) times daily.  1 Inhaler  5  . meloxicam (MOBIC) 7.5 MG tablet Take 1 tablet (7.5 mg total) by mouth daily.  30 tablet  5  . montelukast (SINGULAIR) 10 MG tablet Take 1 tablet (10 mg total) by mouth at bedtime.  30 tablet  5  . predniSONE (DELTASONE) 10 MG tablet Take 6 tablets x 1 day and then decreased by 1/2 tablet per day until down to zero mg.  39  tablet  0  . sertraline (ZOLOFT) 50 MG tablet Take 1.5 tablets (75 mg total) by mouth daily.  30 tablet  2  . triamterene-hydrochlorothiazide (MAXZIDE-25) 37.5-25 MG per tablet Take 1 each (1 tablet total) by mouth daily.  30 tablet  6   No current facility-administered medications on file prior to visit.    Review of Systems  Respiratory: Positive for cough.   Patient denies any headache or dizziness.  No significant sinus issues.  Does report some drainage.  No significant headache.   No chest pain, tightness.  Still with increased cough but better.   Still feels congestion in her chest, but has improved.  Able to take a better breath.  Able to breathe deep without coughing.  Cough mostly nonproductive.   No nausea.   No vomiting.  Decreased appetite.  Tolerating po's.   Drinking.   No abdominal pain or cramping.  Some minimal loose stool.   Fatigue.  Previous decreased heart rate as outlined.  Better today.  Oxygenation better today.  Feels better the last two days.       Objective:   Physical Exam  Filed Vitals:   05/24/13 1500  BP: 120/78  Pulse: 80  Temp: 97.9 F (36.6 C)   Pulse ox 5093-4494%   63 year old female in no acute distress.   HEENT:  Nares- slightly erythematous turbinates.   Oropharynx - without lesions.  No significant tenderness to palpation over the sinus.   NECK:  Supple.  Nontender.   HEART:  Appears to be regular.  LUNGS:  Increased air movement.  No wheezing.  No significant cough with forced expiration.  Good breath sounds bilaterally.   RADIAL PULSE:  Equal bilaterally.  EXTREMITIES:  No increased erythema or swelling.           Assessment & Plan:  HEALTH MAINTENANCE.  Physical 09/13/12.  Pap ok.  GI as above.  Mammogram 02/17/11 - BiRADS II.  Mammogram 03/28/12 recommended follow up views.  Follow up views 04/05/12 - Birads II.  Schedule a f/u mammogram.    I spent 40 minutes with the patient and her daughter and more than 50% of the time was spent in consultation regarding the above.

## 2013-05-26 LAB — TSH

## 2013-05-29 ENCOUNTER — Encounter: Payer: Self-pay | Admitting: Internal Medicine

## 2013-05-29 ENCOUNTER — Other Ambulatory Visit: Payer: Self-pay | Admitting: Internal Medicine

## 2013-05-29 NOTE — Telephone Encounter (Signed)
Called and spoke to pt.  She is feeling better now.  Declines need for evaluation now.  Turned in her holter today.  Due to see cardiology tomorrow.  Was instructed to take 60mg  bid of the diltiazem.  Will be evaluated if any symptoms change or worsening problems.

## 2013-05-29 NOTE — Telephone Encounter (Signed)
Ok refill? 

## 2013-05-29 NOTE — Telephone Encounter (Signed)
Refilled mobic #30 with one refill 

## 2013-05-30 ENCOUNTER — Encounter: Payer: Self-pay | Admitting: Internal Medicine

## 2013-06-07 ENCOUNTER — Encounter: Payer: Self-pay | Admitting: Internal Medicine

## 2013-06-13 ENCOUNTER — Ambulatory Visit (INDEPENDENT_AMBULATORY_CARE_PROVIDER_SITE_OTHER)
Admission: RE | Admit: 2013-06-13 | Discharge: 2013-06-13 | Disposition: A | Discharge status: Home / Self Care | Payer: BC Managed Care – PPO | Source: Ambulatory Visit | Attending: Internal Medicine | Admitting: Internal Medicine

## 2013-06-13 ENCOUNTER — Encounter: Payer: Self-pay | Admitting: Internal Medicine

## 2013-06-13 ENCOUNTER — Ambulatory Visit (INDEPENDENT_AMBULATORY_CARE_PROVIDER_SITE_OTHER): Payer: BC Managed Care – PPO | Admitting: Internal Medicine

## 2013-06-13 VITALS — BP 120/78 | HR 70 | Temp 97.9°F | Ht 65.5 in | Wt 317.2 lb

## 2013-06-13 DIAGNOSIS — R0602 Shortness of breath: Secondary | ICD-10-CM

## 2013-06-13 DIAGNOSIS — J189 Pneumonia, unspecified organism: Secondary | ICD-10-CM

## 2013-06-13 DIAGNOSIS — F329 Major depressive disorder, single episode, unspecified: Secondary | ICD-10-CM

## 2013-06-13 DIAGNOSIS — I10 Essential (primary) hypertension: Secondary | ICD-10-CM

## 2013-06-13 DIAGNOSIS — K219 Gastro-esophageal reflux disease without esophagitis: Secondary | ICD-10-CM

## 2013-06-13 DIAGNOSIS — I4891 Unspecified atrial fibrillation: Secondary | ICD-10-CM

## 2013-06-13 DIAGNOSIS — G4733 Obstructive sleep apnea (adult) (pediatric): Secondary | ICD-10-CM

## 2013-06-13 DIAGNOSIS — R001 Bradycardia, unspecified: Secondary | ICD-10-CM

## 2013-06-13 DIAGNOSIS — E7800 Pure hypercholesterolemia, unspecified: Secondary | ICD-10-CM

## 2013-06-13 DIAGNOSIS — I498 Other specified cardiac arrhythmias: Secondary | ICD-10-CM

## 2013-06-13 DIAGNOSIS — F3289 Other specified depressive episodes: Secondary | ICD-10-CM

## 2013-06-13 DIAGNOSIS — F32A Depression, unspecified: Secondary | ICD-10-CM

## 2013-06-13 NOTE — Progress Notes (Signed)
Pre-visit discussion using our clinic review tool. No additional management support is needed unless otherwise documented below in the visit note.  

## 2013-06-13 NOTE — Patient Instructions (Signed)
Vitamin D3 1000 q day

## 2013-06-14 ENCOUNTER — Other Ambulatory Visit: Payer: Self-pay | Admitting: Internal Medicine

## 2013-06-14 ENCOUNTER — Encounter: Payer: Self-pay | Admitting: Internal Medicine

## 2013-06-14 DIAGNOSIS — J189 Pneumonia, unspecified organism: Secondary | ICD-10-CM

## 2013-06-14 NOTE — Progress Notes (Signed)
Orders placed for f/u cxr.

## 2013-06-17 ENCOUNTER — Encounter: Payer: Self-pay | Admitting: Internal Medicine

## 2013-06-17 DIAGNOSIS — R0602 Shortness of breath: Secondary | ICD-10-CM | POA: Insufficient documentation

## 2013-06-17 NOTE — Progress Notes (Signed)
Subjective:    Patient ID: Kristin MowersHilda Clyatt, female    DOB: 03/07/1951, 63 y.o.   MRN: [id number]  HPI 63 year old female with past history of hypertension, hypercholesterolemia and degenerative joint disease who comes in today for a scheduled follow up.  Has recently been treated for pneumonia.  See previous notes for details.  Is much better now.  Cough better.  Eating and drinking well.   Had been using CPAP.  Also has had issues with bradycardia.  she saw cardiology.  See Darvin Neighboursonna Carroll's note and Dr Philemon KingdomKowalski's note for details.  She is now on diltiazem twice a day.  States had an episode recently with afib.  Heart rate has been better since.  No nausea or vomiting.  Bowels stable.  Feels better. Still gets sob with exertion.  Still with decreased energy and increased fatigue.  Still with some decrease in her oxygen saturation.  Seems to be better now.      Past Medical History  Diagnosis Date  . Hypertension   . Hypercholesterolemia   . Fibrocystic breast disease   . Degenerative joint disease   . Atrial fibrillation     s/p knee surgery.  maintained in SR since  . GERD (gastroesophageal reflux disease)     Current Outpatient Prescriptions on File Prior to Visit  Medication Sig Dispense Refill  . aspirin 325 MG EC tablet Take 325 mg by mouth daily.      . cetirizine (ZYRTEC) 10 MG tablet Take 10 mg by mouth daily.      . CRESTOR 10 MG tablet TAKE 1 TABLET EVERY DAY  30 tablet  5  . diltiazem (CARDIZEM) 30 MG tablet Take 1 tablet (30 mg total) by mouth 2 (two) times daily.  60 tablet  1  . flecainide (TAMBOCOR) 150 MG tablet Take 150 mg by mouth 2 (two) times daily.      . fluticasone (FLONASE) 50 MCG/ACT nasal spray Place 2 sprays into the nose daily.  16 g  1  . fluticasone (FLOVENT DISKUS) 50 MCG/BLIST diskus inhaler Inhale 1 puff into the lungs 2 (two) times daily.  1 Inhaler  5  . meloxicam (MOBIC) 7.5 MG tablet TAKE 1 TABLET EVERY DAY  30 tablet  1  . montelukast (SINGULAIR) 10  MG tablet TAKE ONE TABLET AT BEDTIME  30 tablet  5  . sertraline (ZOLOFT) 50 MG tablet Take 1.5 tablets (75 mg total) by mouth daily.  30 tablet  2  . triamterene-hydrochlorothiazide (MAXZIDE-25) 37.5-25 MG per tablet Take 1 each (1 tablet total) by mouth daily.  30 tablet  6   No current facility-administered medications on file prior to visit.    Review of Systems Patient denies any headache or dizziness.  No significant sinus issues.   No chest pain, tightness or palpitations currently. Breathing as outlined.  No nausea or vomiting.  No acid reflux.  No abdominal pain or cramping.  No bowel change, such as diarrhea, constipation, BRBPR or melana.  No urine change.  Feels she is handling stress relatively well. Feels the zoloft is working well.  Using CPAP regularly.  Still with some fatigue.  Better.            Objective:   Physical Exam  Filed Vitals:   06/13/13 1005  BP: 120/78  Pulse: 70  Temp: 97.9 F (4136.196 C)   63 year old female in no acute distress.   HEENT:  Nares- clear.  Oropharynx - without lesions. NECK:  Supple.  Nontender.  No audible bruit.  HEART:  Appears to be regular. LUNGS:  No crackles or wheezing audible.  Respirations even and unlabored.  Good breath sounds bilaterally.  RADIAL PULSE:  Equal bilaterally.   ABDOMEN:  Soft, nontender.  Bowel sounds present and normal.  No audible abdominal bruit.   EXTREMITIES:  No increased edema present.  DP pulses palpable and equal bilaterally.          Assessment & Plan:  HEMATOLOGY.  Previous H&H increased.  Saw Dr Orlie Dakin.  Recommended no further w/up unless hgb remaining above 18.  Hgb has improved with the use of her CPAP.  Follow.    GI.  Previous abdominal discomfort.  She related to increased stress.  H. Pylori negative.  She continues to decline colonoscopy.  Currently doing well with no abdominal pain.  Follow.   INCREASED PSYCHOSOCIAL STRESSORS.  Feels she is handling things relatively well.  Follow.  Now on  Zoloft  50mg  1 1/2 tablet q day.    HEALTH MAINTENANCE.  Physical 09/13/12.  Pap ok.  GI as above.  Mammogram 02/17/11 - BiRADS II.  Mammogram 03/28/12 recommended follow up views.  Follow up views 04/05/12 - Birads II.  Has been scheduled a f/u mammogram.    I spent 25 minutes with the patient and more than 50% of the time was spent in consultation regarding the above.

## 2013-06-17 NOTE — Assessment & Plan Note (Signed)
Symptoms controlled.  Same meds.  Follow.   

## 2013-06-17 NOTE — Assessment & Plan Note (Signed)
Just saw cardiology.  Had holter.  On diltiazem and flecainide.  Heart rate better.  Follow.

## 2013-06-17 NOTE — Assessment & Plan Note (Signed)
In SR today.  Pulse rate better.  On diltiazem and flecainide.  Continue to f/u with cardiology.  Currently doing better.    

## 2013-06-17 NOTE — Assessment & Plan Note (Signed)
Low cholesterol diet.  Follow lipid panel.  Remains on crestor.    

## 2013-06-17 NOTE — Assessment & Plan Note (Signed)
Doing better.  Repeat cxr today.  Follow.

## 2013-06-17 NOTE — Assessment & Plan Note (Signed)
On zoloft.  Doing better.  On 50mg 1 1/2 tablet q day.  Follow.    

## 2013-06-17 NOTE — Assessment & Plan Note (Signed)
Recent pneumonia.  Treated.  Better.  Recheck cxr.  Heart rate improved.  Saw cardiology.  Discussed further pulmonary w/up.  She declines.  Follow.

## 2013-06-17 NOTE — Assessment & Plan Note (Signed)
Blood pressure doing well.  Follow.  Adjust meds as outlined.

## 2013-06-17 NOTE — Assessment & Plan Note (Signed)
Back using her CPAP now.  Follow.    

## 2013-06-19 ENCOUNTER — Encounter: Payer: Self-pay | Admitting: Internal Medicine

## 2013-06-26 ENCOUNTER — Other Ambulatory Visit: Payer: Self-pay | Admitting: Internal Medicine

## 2013-07-11 ENCOUNTER — Ambulatory Visit (INDEPENDENT_AMBULATORY_CARE_PROVIDER_SITE_OTHER)
Admission: RE | Admit: 2013-07-11 | Discharge: 2013-07-11 | Disposition: A | Discharge status: Home / Self Care | Payer: BC Managed Care – PPO | Source: Ambulatory Visit | Attending: Internal Medicine | Admitting: Internal Medicine

## 2013-07-11 DIAGNOSIS — J189 Pneumonia, unspecified organism: Secondary | ICD-10-CM

## 2013-07-13 ENCOUNTER — Encounter: Payer: Self-pay | Admitting: Internal Medicine

## 2013-07-13 DIAGNOSIS — R9389 Abnormal findings on diagnostic imaging of other specified body structures: Secondary | ICD-10-CM

## 2013-07-25 ENCOUNTER — Other Ambulatory Visit: Payer: Self-pay | Admitting: Internal Medicine

## 2013-07-26 ENCOUNTER — Other Ambulatory Visit: Payer: Self-pay | Admitting: *Deleted

## 2013-07-26 NOTE — Telephone Encounter (Signed)
Order placed for chest CT 

## 2013-07-26 NOTE — Telephone Encounter (Signed)
Is this a medication she takes regularly, last refill was done on 04/18/2012 x 6 RF. Ok to refill the Mobic as well?

## 2013-07-26 NOTE — Telephone Encounter (Signed)
Please clarify with pt how she is taking the triam/hctz.  Also clarify if takes mobic daily.  Thanks.

## 2013-07-27 ENCOUNTER — Other Ambulatory Visit: Payer: Self-pay | Admitting: *Deleted

## 2013-07-27 ENCOUNTER — Telehealth: Payer: Self-pay | Admitting: Internal Medicine

## 2013-07-27 ENCOUNTER — Other Ambulatory Visit: Payer: Self-pay | Admitting: Internal Medicine

## 2013-07-27 DIAGNOSIS — Z79899 Other long term (current) drug therapy: Secondary | ICD-10-CM

## 2013-07-27 DIAGNOSIS — O1420 HELLP syndrome (HELLP), unspecified trimester: Secondary | ICD-10-CM

## 2013-07-27 DIAGNOSIS — D582 Other hemoglobinopathies: Secondary | ICD-10-CM

## 2013-07-27 MED ORDER — TRIAMTERENE-HCTZ 37.5-25 MG PO TABS: 1.0000 | ORAL_TABLET | Freq: Every day | ORAL | Status: DC

## 2013-07-27 MED ORDER — MELOXICAM 7.5 MG PO TABS: ORAL_TABLET | ORAL | Status: DC

## 2013-07-27 NOTE — Telephone Encounter (Signed)
Ok to refill x 1, but I would like for her to come in to have kidney function checked just to confirm stable - since taking this daily.  Would like for her to do this before next refill.  Non fasting lab.  I will place order for labs.  Thanks.

## 2013-07-27 NOTE — Telephone Encounter (Signed)
Ok refill? 

## 2013-07-27 NOTE — Progress Notes (Signed)
Order placed for f/u labs.  

## 2013-07-27 NOTE — Telephone Encounter (Signed)
Says Dr. Lorin PicketScott sent her a mychart msg that you will be in touch with a CT appt.  STates she had given Dr. Lorin PicketScott the dates she could go:   Monday 5/4 or Tuesday 5/5 or Thursday 5/7 any time of day.   Could go on 5/6 or 5/8 if she could be out by 11 a.m. Because she has to pick up grandchildren.   Says to call or put on mychart.  States she would like it done next week if at all possible.

## 2013-07-27 NOTE — Telephone Encounter (Signed)
Sent mychart message on need for labs. Does have appointment scheduled with Dr. Lorin PicketScott 08/08/13

## 2013-08-01 NOTE — Telephone Encounter (Signed)
Called and spoke with patient, she is currently taking both medications and takes the Triam/HCTZ once a day. Both medications were sent to the pharmacy for refills.

## 2013-08-03 ENCOUNTER — Ambulatory Visit: Payer: Self-pay | Admitting: Internal Medicine

## 2013-08-03 NOTE — Telephone Encounter (Signed)
The Patient has been scheduled and she is aware of her appointment.

## 2013-08-08 ENCOUNTER — Ambulatory Visit (INDEPENDENT_AMBULATORY_CARE_PROVIDER_SITE_OTHER): Payer: BC Managed Care – PPO | Admitting: Internal Medicine

## 2013-08-08 ENCOUNTER — Encounter: Payer: Self-pay | Admitting: Internal Medicine

## 2013-08-08 VITALS — BP 110/60 | HR 65 | Temp 98.3°F | Ht 65.5 in | Wt 328.8 lb

## 2013-08-08 DIAGNOSIS — F3289 Other specified depressive episodes: Secondary | ICD-10-CM

## 2013-08-08 DIAGNOSIS — I10 Essential (primary) hypertension: Secondary | ICD-10-CM

## 2013-08-08 DIAGNOSIS — D582 Other hemoglobinopathies: Secondary | ICD-10-CM

## 2013-08-08 DIAGNOSIS — K219 Gastro-esophageal reflux disease without esophagitis: Secondary | ICD-10-CM

## 2013-08-08 DIAGNOSIS — I498 Other specified cardiac arrhythmias: Secondary | ICD-10-CM

## 2013-08-08 DIAGNOSIS — R001 Bradycardia, unspecified: Secondary | ICD-10-CM

## 2013-08-08 DIAGNOSIS — E7800 Pure hypercholesterolemia, unspecified: Secondary | ICD-10-CM

## 2013-08-08 DIAGNOSIS — I4891 Unspecified atrial fibrillation: Secondary | ICD-10-CM

## 2013-08-08 DIAGNOSIS — Z79899 Other long term (current) drug therapy: Secondary | ICD-10-CM

## 2013-08-08 DIAGNOSIS — G4733 Obstructive sleep apnea (adult) (pediatric): Secondary | ICD-10-CM

## 2013-08-08 DIAGNOSIS — R0602 Shortness of breath: Secondary | ICD-10-CM

## 2013-08-08 DIAGNOSIS — F329 Major depressive disorder, single episode, unspecified: Secondary | ICD-10-CM

## 2013-08-08 DIAGNOSIS — J189 Pneumonia, unspecified organism: Secondary | ICD-10-CM

## 2013-08-08 DIAGNOSIS — F32A Depression, unspecified: Secondary | ICD-10-CM

## 2013-08-08 NOTE — Progress Notes (Signed)
Pre visit review using our clinic review tool, if applicable. No additional management support is needed unless otherwise documented below in the visit note. 

## 2013-08-09 LAB — CBC WITH DIFFERENTIAL/PLATELET
Basophils Absolute: 0.1 10*3/uL (ref 0.0–0.1)
Basophils Relative: 0.9 % (ref 0.0–3.0)
EOS PCT: 8 % — AB (ref 0.0–5.0)
Eosinophils Absolute: 0.6 10*3/uL (ref 0.0–0.7)
HCT: 47.1 % — ABNORMAL HIGH (ref 36.0–46.0)
Hemoglobin: 15.6 g/dL — ABNORMAL HIGH (ref 12.0–15.0)
LYMPHS PCT: 20.4 % (ref 12.0–46.0)
Lymphs Abs: 1.6 10*3/uL (ref 0.7–4.0)
MCHC: 33.1 g/dL (ref 30.0–36.0)
MCV: 90 fl (ref 78.0–100.0)
MONOS PCT: 4.9 % (ref 3.0–12.0)
Monocytes Absolute: 0.4 10*3/uL (ref 0.1–1.0)
NEUTROS PCT: 65.8 % (ref 43.0–77.0)
Neutro Abs: 5.3 10*3/uL (ref 1.4–7.7)
PLATELETS: 224 10*3/uL (ref 150.0–400.0)
RBC: 5.23 Mil/uL — ABNORMAL HIGH (ref 3.87–5.11)
RDW: 14.3 % (ref 11.5–15.5)
WBC: 8 10*3/uL (ref 4.0–10.5)

## 2013-08-09 LAB — HEPATIC FUNCTION PANEL
ALBUMIN: 4 g/dL (ref 3.5–5.2)
ALT: 19 U/L (ref 0–35)
AST: 25 U/L (ref 0–37)
Alkaline Phosphatase: 69 U/L (ref 39–117)
Bilirubin, Direct: 0.1 mg/dL (ref 0.0–0.3)
Total Bilirubin: 0.7 mg/dL (ref 0.2–1.2)
Total Protein: 7.1 g/dL (ref 6.0–8.3)

## 2013-08-09 LAB — BASIC METABOLIC PANEL
BUN: 20 mg/dL (ref 6–23)
CO2: 30 mEq/L (ref 19–32)
Calcium: 9.4 mg/dL (ref 8.4–10.5)
Chloride: 103 mEq/L (ref 96–112)
Creatinine, Ser: 1 mg/dL (ref 0.4–1.2)
GFR: 58.6 mL/min — AB (ref >60.00)
GLUCOSE: 84 mg/dL (ref 70–99)
Potassium: 4.3 mEq/L (ref 3.5–5.1)
Sodium: 142 mEq/L (ref 135–145)

## 2013-08-10 ENCOUNTER — Encounter: Payer: Self-pay | Admitting: Internal Medicine

## 2013-08-14 ENCOUNTER — Encounter: Payer: Self-pay | Admitting: Internal Medicine

## 2013-08-14 NOTE — Assessment & Plan Note (Signed)
In SR today.  Pulse rate better.  On diltiazem and flecainide.  Continue to f/u with cardiology.  Currently doing better.

## 2013-08-14 NOTE — Assessment & Plan Note (Signed)
Recent pneumonia.  Treated.  Better.  Heart rate improved.  Saw cardiology.  Discussed further pulmonary w/up given abnormal CT.  She agrees.  Schedule pulmonary appt.

## 2013-08-14 NOTE — Assessment & Plan Note (Signed)
Blood pressure doing well.  Follow.  

## 2013-08-14 NOTE — Assessment & Plan Note (Signed)
Seeing cardiology.  On diltiazem and flecainide.  Heart rate better.  Follow.

## 2013-08-14 NOTE — Assessment & Plan Note (Signed)
Symptoms controlled.  Same meds.  Follow.

## 2013-08-14 NOTE — Progress Notes (Signed)
Subjective:    Patient ID: Kristin MowersHilda [name], female    DOB: 12/20/1950, 63 y.o.   MRN: [id number]  HPI 63 year old female with past history of hypertension, hypercholesterolemia and degenerative joint disease who comes in today for a scheduled follow up.  Has recently been treated for pneumonia.  See previous notes for details.  Is much better now.  Cough better.  Eating and drinking well.   Is back using CPAP.  Also had issues with bradycardia.  Saw cardiology.  See Darvin Neighboursonna Carroll's note and Dr Philemon KingdomKowalski's note for details.  She is now on diltiazem twice a day.  She reports still feeling herself going in and out of afib.  No significant bradycardia and no significant increased heart rate.  Heart rate has been better.  No nausea or vomiting.  Bowels stable.  Feels better.  Still gets sob with exertion.  Found to have persistent changes on her cxr.  Had CT scan that revealed nonspecific reticular opacities asymmetrically involving the left upper lobe and lingula with associated volume loss.  Described as nonspecific findings.  See CT report for details.  Recommended PFTs with possible bronchoscopy.       Past Medical History  Diagnosis Date  . Hypertension   . Hypercholesterolemia   . Fibrocystic breast disease   . Degenerative joint disease   . Atrial fibrillation     s/p knee surgery.  maintained in SR since  . GERD (gastroesophageal reflux disease)     Current Outpatient Prescriptions on File Prior to Visit  Medication Sig Dispense Refill  . aspirin 325 MG EC tablet Take 325 mg by mouth daily.      . cetirizine (ZYRTEC) 10 MG tablet Take 10 mg by mouth daily.      . CRESTOR 10 MG tablet TAKE 1 TABLET EVERY DAY  30 tablet  5  . diltiazem (CARDIZEM) 30 MG tablet Take 1 tablet (30 mg total) by mouth 2 (two) times daily.  60 tablet  1  . flecainide (TAMBOCOR) 150 MG tablet Take 150 mg by mouth 2 (two) times daily.      . fluticasone (FLONASE) 50 MCG/ACT nasal spray Place 2 sprays into the nose  daily.  16 g  1  . fluticasone (FLOVENT DISKUS) 50 MCG/BLIST diskus inhaler Inhale 1 puff into the lungs 2 (two) times daily.  1 Inhaler  5  . meloxicam (MOBIC) 7.5 MG tablet TAKE 1 TABLET EVERY DAY  30 tablet  0  . montelukast (SINGULAIR) 10 MG tablet TAKE ONE TABLET AT BEDTIME  30 tablet  5  . sertraline (ZOLOFT) 50 MG tablet TAKE ONE AND ONE-HALF TABLETS DAILY  45 tablet  2  . triamterene-hydrochlorothiazide (MAXZIDE-25) 37.5-25 MG per tablet Take 1 tablet by mouth daily.  30 tablet  5   No current facility-administered medications on file prior to visit.    Review of Systems Patient denies any headache or dizziness.  No significant sinus issues.   No chest pain, tightness or palpitations currently. Breathing as outlined.  No nausea or vomiting.  No acid reflux.  No abdominal pain or cramping.  No bowel change, such as diarrhea, constipation, BRBPR or melana.  No urine change.  Feels she is handling stress relatively well.  Feels the zoloft is working well.  Using CPAP regularly.   She also reports some left lateral leg pain.  When she lies down - worse.  Knees bother her.  Some hip pain.  Objective:   Physical Exam  Filed Vitals:   08/08/13 1445  BP: 110/60  Pulse: 65  Temp: 98.3 F (4136.678 C)   63 year old female in no acute distress.   HEENT:  Nares- clear.  Oropharynx - without lesions. NECK:  Supple.  Nontender.  No audible bruit.  HEART:  Appears to be regular. LUNGS:  No crackles or wheezing audible.  Respirations even and unlabored.  Good breath sounds bilaterally.  RADIAL PULSE:  Equal bilaterally.   ABDOMEN:  Soft, nontender.  Bowel sounds present and normal.  No audible abdominal bruit.   EXTREMITIES:  No increased edema present.  DP pulses palpable and equal bilaterally.   MSK:  No significant increased pain in the back with straight leg raise.          Assessment & Plan:  HEMATOLOGY.  Previous H&H increased.  Saw Dr Orlie DakinFinnegan.  Recommended no further w/up  unless hgb remaining above 18.  Hgb has improved with the use of her CPAP.  Follow.    GI.  Previous abdominal discomfort.  She related to increased stress.  H. Pylori negative.  She continues to decline colonoscopy.  Currently doing well with no abdominal pain.  Follow.   INCREASED PSYCHOSOCIAL STRESSORS.  Feels she is handling things relatively well.  Follow.  Now on Zoloft  50mg  1 1/2 tablet q day.    HEALTH MAINTENANCE.  Physical 09/13/12.  Pap ok.  GI as above.  Mammogram 02/17/11 - BiRADS II.  Mammogram 03/28/12 recommended follow up views.  Follow up views 04/05/12 - Birads II.  Has been scheduled a f/u mammogram.  Needs.    I spent 25 minutes with the patient and more than 50% of the time was spent in consultation regarding the above.

## 2013-08-14 NOTE — Assessment & Plan Note (Signed)
On zoloft.  Doing better.  On 50mg  1 1/2 tablet q day.  Follow.

## 2013-08-14 NOTE — Assessment & Plan Note (Signed)
Back using her CPAP now.  Follow.

## 2013-08-14 NOTE — Assessment & Plan Note (Signed)
Doing better.  Persistent changes on cxr. CT as outlined.  Recommended PFTs and bronchoscopy.  Discussed with her at length.  Will refer to pulmonary for further evaluation and treatment.

## 2013-08-14 NOTE — Assessment & Plan Note (Signed)
Low cholesterol diet.  Follow lipid panel.  Remains on crestor.

## 2013-08-15 NOTE — Telephone Encounter (Signed)
Unread mychart message mailed to patient 

## 2013-08-22 ENCOUNTER — Other Ambulatory Visit: Payer: Self-pay | Admitting: Internal Medicine

## 2013-08-29 ENCOUNTER — Ambulatory Visit (INDEPENDENT_AMBULATORY_CARE_PROVIDER_SITE_OTHER): Payer: BC Managed Care – PPO | Admitting: Pulmonary Disease

## 2013-08-29 ENCOUNTER — Encounter: Payer: Self-pay | Admitting: Pulmonary Disease

## 2013-08-29 VITALS — BP 124/66 | HR 70 | Ht 65.5 in | Wt 333.0 lb

## 2013-08-29 DIAGNOSIS — R05 Cough: Secondary | ICD-10-CM

## 2013-08-29 DIAGNOSIS — R0602 Shortness of breath: Secondary | ICD-10-CM | POA: Diagnosis not present

## 2013-08-29 DIAGNOSIS — R9389 Abnormal findings on diagnostic imaging of other specified body structures: Secondary | ICD-10-CM | POA: Insufficient documentation

## 2013-08-29 DIAGNOSIS — R059 Cough, unspecified: Secondary | ICD-10-CM | POA: Diagnosis not present

## 2013-08-29 MED ORDER — AEROCHAMBER MV MISC: Status: DC

## 2013-08-29 NOTE — Patient Instructions (Signed)
We will arrange lung function testing at [facility] Take advair 2 puffs with a spacer twice a day no matter how you feel Take the proAir 2 puffs every four hours as needed for shortness of breath  Before you go to bed, use saline rinses [name]), then use saline gel in your nostrils before starting CPAP.  We will see you back in a month or sooner if needed

## 2013-08-29 NOTE — Assessment & Plan Note (Signed)
I believe this is due to postnasal drip and bronchiectasis. There is also likely some component of laryngeal irritation from long-standing cough.  Plan: -Over-the-counter antihistamine such as Zyrtec -Voice rest/cough suppression -Over-the-counter Delsym -Saline gel with CPAP nasal pillows at night -Saline rinses when sinus congestion is significant.

## 2013-08-29 NOTE — Assessment & Plan Note (Signed)
It sounds like throughout 2015 Kristin [name] has developed fibrotic change in the left chest. Considering the diagnosis of flu and pneumonia from earlier this year the most likely etiology of the bronchiectasis and scarring in the left lung is postinfectious. The radiologist is noted that the differential diagnosis includes an inflammatory conditions, but considering her history postinfectious is most likely.  Unfortunately this will likely mean that she will be left with lifelong bronchiectasis. This will lead to recurrent bronchial infections. To try to prevent this I am going to treat her with an inhaled corticosteroid/long acting bronchodilator. I think she should continue using the flutter valve as she is doing to produce mucus.  Plan: -Obtain full pulmonary function test -Continue flutter valve to produce mucus when needed -If and when she develops an upper respiratory infection with mucus production she should be treated with an antibiotic for it least a 10 day course -Trial of Advair HFA 2 puffs twice a day low-dose with a spacer -Use Provera as needed with a spacer -Close monitoring of pulmonary function testing Q6 months to ensure that the fibrotic change is not progressing -Followup one month

## 2013-08-29 NOTE — Progress Notes (Signed)
Subjective:    Patient ID: Kristin [name], female    DOB: 1950/08/13, 63 y.o.   MRN: [id number]  HPI  This is a very pleasant 63 year old female who comes her clinic today for evaluation of cough and shortness of breath. Back in February of 2015 she developed influenza and then eventually community acquired pneumonia. Apparently she was very ill at that time with fever, shortness of breath, and cough productive of mucus. She waited for about 3 or 4 days before she came to the doctor and then she was treated with antibiotics. She never required admission. Initially she did improve somewhat in that she did not have much cough. However, as the spring  She developed a postnasal drip and a cough which persisted for some time. This is been associated with chest tightness and mucus production. Because of this she came back to her primary care physician and she had a chest x-ray performed which showed that there had been progression of fibrosis in the left lung which is a new finding compared to the infiltrate which had been seen in the February 2015 study. She eventually had a CT scan of her chest which showed evidence of pulmonary fibrosis, and bronchiectasis in the left lung primarily.  She never had any breathing trouble as a child and she never been diagnosed with a respiratory problem.  She continues to have a dry cough on a daily basis as well as some shortness of breath. She recently been given a flutter bout which she said helped her produce mucus for a while.  She has a lot of sinus congestion at night when she puts on her CPAP nasal pillows. This causes her to cough up a significant amount of mucus in the mornings.  She is currently taking Provera as needed for shortness of breath but she says that it does not help very much.  Past Medical History  Diagnosis Date  . Hypertension   . Hypercholesterolemia   . Fibrocystic breast disease   . Degenerative joint disease   . Atrial fibrillation      s/p knee surgery.  maintained in SR since  . GERD (gastroesophageal reflux disease)      Family History  Problem Relation Age of Onset  . Heart disease Father     myocardial infarction  . Diabetes Father   . Pancreatitis Mother     gallstone  . Colon cancer Neg Hx      History   Social History  . Marital Status: Married    Spouse Name: N/A    Number of Children: 3  . Years of Education: N/A   Occupational History  . Not on file.   Social History Main Topics  . Smoking status: Former Smoker -- 1.00 packs/day for 15 years    Types: Cigarettes    Quit date: 03/31/1983  . Smokeless tobacco: Never Used  . Alcohol Use: Yes     Comment: occasional  . Drug Use: No  . Sexual Activity: Not on file   Other Topics Concern  . Not on file   Social History Narrative  . No narrative on file     Allergies  Allergen Reactions  . Iodine   . Lisinopril Cough  . Tetanus Toxoids     Hives, welts     Outpatient Prescriptions Prior to Visit  Medication Sig Dispense Refill  . aspirin 325 MG EC tablet Take 325 mg by mouth daily.      . cetirizine (ZYRTEC) 10  MG tablet Take 10 mg by mouth daily.      . CRESTOR 10 MG tablet TAKE 1 TABLET EVERY DAY  30 tablet  5  . flecainide (TAMBOCOR) 150 MG tablet Take 150 mg by mouth 2 (two) times daily.      . fluticasone (FLOVENT DISKUS) 50 MCG/BLIST diskus inhaler Inhale 1 puff into the lungs 2 (two) times daily.  1 Inhaler  5  . meloxicam (MOBIC) 7.5 MG tablet TAKE ONE TABLET DAILY WITH FOOD  30 tablet  2  . montelukast (SINGULAIR) 10 MG tablet TAKE ONE TABLET AT BEDTIME  30 tablet  5  . sertraline (ZOLOFT) 50 MG tablet TAKE ONE AND ONE-HALF TABLETS DAILY  45 tablet  2  . triamterene-hydrochlorothiazide (MAXZIDE-25) 37.5-25 MG per tablet Take 1 tablet by mouth daily.  30 tablet  5  . diltiazem (CARDIZEM) 30 MG tablet Take 1 tablet (30 mg total) by mouth 2 (two) times daily.  60 tablet  1  . fluticasone (FLONASE) 50 MCG/ACT nasal spray  Place 2 sprays into the nose daily.  16 g  1   No facility-administered medications prior to visit.      Review of Systems  Constitutional: Negative for fever and unexpected weight change.  HENT: Positive for congestion and postnasal drip. Negative for dental problem, ear pain, nosebleeds, rhinorrhea, sinus pressure, sneezing, sore throat and trouble swallowing.   Eyes: Negative for redness and itching.  Respiratory: Positive for shortness of breath and wheezing. Negative for cough and chest tightness.   Cardiovascular: Negative for palpitations and leg swelling.  Gastrointestinal: Negative for nausea and vomiting.  Genitourinary: Negative for dysuria.  Musculoskeletal: Negative for joint swelling.  Skin: Negative for rash.  Neurological: Negative for headaches.  Hematological: Does not bruise/bleed easily.  Psychiatric/Behavioral: Negative for dysphoric mood. The patient is not nervous/anxious.        Objective:   Physical Exam  Filed Vitals:   08/29/13 1637  BP: 124/66  Pulse: 70  Height: 5' 5.5" (1.664 m)  Weight: 333 lb (151.048 kg)  SpO2: 94%  RA  Gen: obese but well appearing, no acute distress HEENT: NCAT, PERRL, EOMi, OP clear, neck supple without masses PULM: Crackles left lung throughout CV: RRR, no mgr, no JVD AB: BS+, soft, nontender, no hsm Ext: warm, chronic appearing trace leg edema, no clubbing, no cyanosis Derm: no rash or skin breakdown Neuro: A&Ox4, CN II-XII intact, strength 5/5 in all 4 extremities       Assessment & Plan:   Abnormal CT scan, chest It sounds like throughout 2015 Kristin [name] has developed fibrotic change in the left chest. Considering the diagnosis of flu and pneumonia from earlier this year the most likely etiology of the bronchiectasis and scarring in the left lung is postinfectious. The radiologist is noted that the differential diagnosis includes an inflammatory conditions, but considering her history postinfectious is most  likely.  Unfortunately this will likely mean that she will be left with lifelong bronchiectasis. This will lead to recurrent bronchial infections. To try to prevent this I am going to treat her with an inhaled corticosteroid/long acting bronchodilator. I think she should continue using the flutter valve as she is doing to produce mucus.  Plan: -Obtain full pulmonary function test -Continue flutter valve to produce mucus when needed -If and when she develops an upper respiratory infection with mucus production she should be treated with an antibiotic for it least a 10 day course -Trial of Advair HFA 2 puffs twice  a day low-dose with a spacer -Use Provera as needed with a spacer -Close monitoring of pulmonary function testing Q6 months to ensure that the fibrotic change is not progressing -Followup one month  Cough I believe this is due to postnasal drip and bronchiectasis. There is also likely some component of laryngeal irritation from long-standing cough.  Plan: -Over-the-counter antihistamine such as Zyrtec -Voice rest/cough suppression -Over-the-counter Delsym -Saline gel with CPAP nasal pillows at night -Saline rinses when sinus congestion is significant.   Updated Medication List Outpatient Encounter Prescriptions as of 08/29/2013  Medication Sig  . aspirin 325 MG EC tablet Take 325 mg by mouth daily.  . cetirizine (ZYRTEC) 10 MG tablet Take 10 mg by mouth daily.  . CRESTOR 10 MG tablet TAKE 1 TABLET EVERY DAY  . diltiazem (CARDIZEM) 30 MG tablet Take 15 mg by mouth 2 (two) times daily.  . flecainide (TAMBOCOR) 150 MG tablet Take 150 mg by mouth 2 (two) times daily.  . fluticasone (FLONASE) 50 MCG/ACT nasal spray Place 2 sprays into the nose daily as needed.  . fluticasone (FLOVENT DISKUS) 50 MCG/BLIST diskus inhaler Inhale 1 puff into the lungs 2 (two) times daily.  . meloxicam (MOBIC) 7.5 MG tablet TAKE ONE TABLET DAILY WITH FOOD  . montelukast (SINGULAIR) 10 MG tablet TAKE  ONE TABLET AT BEDTIME  . Respiratory Therapy Supplies (FLUTTER) DEVI daily.  . sertraline (ZOLOFT) 50 MG tablet TAKE ONE AND ONE-HALF TABLETS DAILY  . triamterene-hydrochlorothiazide (MAXZIDE-25) 37.5-25 MG per tablet Take 1 tablet by mouth daily.  . [DISCONTINUED] diltiazem (CARDIZEM) 30 MG tablet Take 1 tablet (30 mg total) by mouth 2 (two) times daily.  . [DISCONTINUED] fluticasone (FLONASE) 50 MCG/ACT nasal spray Place 2 sprays into the nose daily.  Marland Kitchen Spacer/Aero-Holding Chambers (AEROCHAMBER MV) inhaler Use as instructed

## 2013-08-30 ENCOUNTER — Encounter: Payer: Self-pay | Admitting: Internal Medicine

## 2013-09-12 ENCOUNTER — Ambulatory Visit: Payer: Self-pay | Admitting: Pulmonary Disease

## 2013-09-12 LAB — PULMONARY FUNCTION TEST

## 2013-09-25 ENCOUNTER — Telehealth: Payer: Self-pay | Admitting: Pulmonary Disease

## 2013-09-25 ENCOUNTER — Other Ambulatory Visit: Payer: Self-pay | Admitting: Internal Medicine

## 2013-09-25 ENCOUNTER — Encounter: Payer: Self-pay | Admitting: Pulmonary Disease

## 2013-09-25 MED ORDER — FLUTICASONE-SALMETEROL 250-50 MCG/DOSE IN AEPB: 1.0000 | INHALATION_SPRAY | Freq: Two times a day (BID) | RESPIRATORY_TRACT | Status: DC

## 2013-09-25 NOTE — Telephone Encounter (Signed)
Called, spoke with pt.  She has a few concerns: 1.  Was seen by BQ on June 2 in [place] and was asked to f/u in 4 wks.  Requesting to schedule this appt.  We have scheduled her to see BQ on Thursday, July 2 at 4 pm in [place].  Pt aware. 2.  Pt states she had PFT done on 09/12/13 [facility] -- requesting results prior to Thursday's OV incase she has any questions to ask during visit regarding the results. 3.  Pt states she was given Advair HFA to trial.  She took the last dose on Saturday.  She noticed breathing was "somewhat better" while on it.  Would like to know if she should continue with this.  If so, she will need a rx or sample.   Dr. Kendrick FriesMcQuaid, pls advise.  Thank you.

## 2013-09-25 NOTE — Telephone Encounter (Signed)
Refilled zoloft #45 with 2 refills.   

## 2013-09-25 NOTE — Telephone Encounter (Signed)
OK to Rx Advair 250/50 bid I have not seen PFT and will try to get results to her prior to office visit

## 2013-09-25 NOTE — Telephone Encounter (Signed)
Ok to fill 

## 2013-09-25 NOTE — Telephone Encounter (Signed)
Sent Advair 250/50 1 puff BID to [facility], the only pharmacy listed on pt's chart. LMTCB X1 to make pt aware of this and to make her aware about the pft.

## 2013-09-25 NOTE — Telephone Encounter (Signed)
Pt calling again, has not received a return call.Caren GriffinsStanley A Dalton

## 2013-09-26 NOTE — Telephone Encounter (Signed)
Spoke with pt, she is aware of results.  Nothing further needed.  

## 2013-09-26 NOTE — Telephone Encounter (Signed)
Pt aware of Rx being sent to pharmacy Aware also that we are in the process of trying to get her PFT results.   Morrie Sheldonshley please advise if you have seen these results. Thanks.

## 2013-09-26 NOTE — Telephone Encounter (Signed)
A,   Please let her know that her PFT showed restrictive lung disease due to her size, but no other problem.   Thanks  B   -------------------------------------------------------------  lmtcb X1 to relay results

## 2013-09-28 ENCOUNTER — Encounter: Payer: Self-pay | Admitting: Pulmonary Disease

## 2013-09-28 ENCOUNTER — Ambulatory Visit (INDEPENDENT_AMBULATORY_CARE_PROVIDER_SITE_OTHER): Payer: BC Managed Care – PPO | Admitting: Pulmonary Disease

## 2013-09-28 VITALS — BP 128/66 | HR 63 | Ht 65.5 in | Wt 330.0 lb

## 2013-09-28 DIAGNOSIS — R059 Cough, unspecified: Secondary | ICD-10-CM

## 2013-09-28 DIAGNOSIS — R05 Cough: Secondary | ICD-10-CM

## 2013-09-28 DIAGNOSIS — J8410 Pulmonary fibrosis, unspecified: Secondary | ICD-10-CM

## 2013-09-28 DIAGNOSIS — R0602 Shortness of breath: Secondary | ICD-10-CM

## 2013-09-28 DIAGNOSIS — Z23 Encounter for immunization: Secondary | ICD-10-CM

## 2013-09-28 DIAGNOSIS — J849 Interstitial pulmonary disease, unspecified: Secondary | ICD-10-CM | POA: Insufficient documentation

## 2013-09-28 NOTE — Progress Notes (Signed)
Subjective:    Patient ID: Kristin [name], female    DOB: 11/06/1950, 63 y.o.   MRN: [id number]  Synopsis: Kristin [name] first saw [facility] in 2015 port chest x-ray suggestive of interstitial lung disease. She never had a prior pulmonary diagnosis but in February of 2015 she had a very severe episode of community acquired pneumonia. Checks he waited for several weeks before she was hospitalized for this. The symptoms lasted for months. May 2015 CT chest> left greater than right interstitial changes throughout the left upper lobe as well as in the left base, question focal areas of bronchiectasis in the lingula, basilar very mild interstitial changes in a peripheral distribution in the right lung overall findings are nonspecific 08/2013 PFT> ratio 74%, FEV1 1.66L (74% pred, 1% change), TLC 2.98L (59% pred), ERV 0.03 (3% pred), DLCO 14.2 (48% pred)  HPI  09/28/2013> Since the last visit Kristin [name] tells me that she is starting to feel less short of breath. She is capable of doing things around the house now without feeling short of breath where as before just going to the bathroom and make her short of breath. She followed my instructions and stopped talking and use cough suppressant therapies around-the-clock for 3 days. She said during this time she did not cough one time. However, when she started talking again she said about 45 minutes later she started coughing. She continues to cough at night when she lays flat. Eating does not seem to make a difference with the cough. She has not had leg swelling. She is frustrated with the cough. She continues to have significant postnasal drip newly all day long despite taking her sinus regimen.  Past Medical History  Diagnosis Date  . Hypertension   . Hypercholesterolemia   . Fibrocystic breast disease   . Degenerative joint disease   . Atrial fibrillation     s/p knee surgery.  maintained in SR since  . GERD (gastroesophageal reflux  disease)      Review of Systems  Constitutional: Negative for fever, chills and fatigue.  HENT: Negative for postnasal drip, rhinorrhea and sinus pressure.   Respiratory: Positive for cough and shortness of breath. Negative for wheezing.   Cardiovascular: Positive for leg swelling. Negative for chest pain and palpitations.       Objective:   Physical Exam Filed Vitals:   09/28/13 1612  BP: 128/66  Pulse: 63  Height: 5' 5.5" (1.664 m)  Weight: 330 lb (149.687 kg)  SpO2: 93%   Annulated 500 feet on room air and oxygen saturation remained at 94% or above  Gen: Obese but well appearing, no acute distress HEENT: NCAT,  EOMi, OP clear, neck supple without masses PULM: Crackles throughout left upper lobe, clear to auscultation in the right lung CV: RRR, no mgr, no JVD AB: BS+, soft, nontender, no hsm Ext: warm, trace edema, no clubbing, no cyanosis Derm: no rash or skin breakdown  May 2015 CT chest> left greater than right interstitial changes throughout the left upper lobe as well as in the left base, question focal areas of bronchiectasis in the lingula, basilar very mild interstitial changes in a peripheral distribution in the right lung overall findings are nonspecific 08/2013 PFT> ratio 74%, FEV1 1.66L (74% pred, 1% change), TLC 2.98L (59% pred), ERV 0.03 (3% pred), DLCO 14.2 (48% pred)     Assessment & Plan:   ILD (interstitial lung disease) I believe that she has significant pulmonary scarring from the episode of pneumonia  she experienced in February. I am encouraged by the fact that her shortness of breath has improved in the last month. Clearly, her shortness of breath is going to be complicated by her obesity and deconditioning.  However, her pulmonary function testing was consistent with severe restrictive lung disease which was due in part I believe to both obesity and the scarring in her lungs.  I'm hopeful that this is not an inflammatory (autoimmune) or  progressive  process. It's encouraging that she feels better after just 4 weeks of observation alone.  Plan: -Repeat pulmonary function tests in 6 months -Repeat high-resolution CT chest in 6 months -Stay active, exercise, lose weight  Cough Because she had complete resolution of her cough when she stopped talking I feel that this is directly related to vocal cord irritation. That is do to ongoing postnasal drip as well as acid reflux.  Plan: -Continue sinus regimen -Add proton pump inhibitor -If no improvement with proton pump inhibitor then we will have her see [place] [facility] for further evaluation   Updated Medication List Outpatient Encounter Prescriptions as of 09/28/2013  Medication Sig  . aspirin 325 MG EC tablet Take 325 mg by mouth daily.  . cetirizine (ZYRTEC) 10 MG tablet Take 10 mg by mouth daily.  . CRESTOR 10 MG tablet TAKE 1 TABLET EVERY DAY  . diltiazem (CARDIZEM) 30 MG tablet Take 15 mg by mouth 2 (two) times daily.  . flecainide (TAMBOCOR) 150 MG tablet Take 150 mg by mouth 2 (two) times daily.  . fluticasone (FLONASE) 50 MCG/ACT nasal spray Place 2 sprays into the nose daily as needed.  . fluticasone (FLOVENT DISKUS) 50 MCG/BLIST diskus inhaler Inhale 1 puff into the lungs 2 (two) times daily.  . Fluticasone-Salmeterol (ADVAIR DISKUS) 250-50 MCG/DOSE AEPB Inhale 1 puff into the lungs 2 (two) times daily.  . meloxicam (MOBIC) 7.5 MG tablet TAKE ONE TABLET DAILY WITH FOOD  . montelukast (SINGULAIR) 10 MG tablet TAKE ONE TABLET AT BEDTIME  . Respiratory Therapy Supplies (FLUTTER) DEVI daily.  . sertraline (ZOLOFT) 50 MG tablet TAKE ONE AND ONE-HALF TABLETS DAILY  . Spacer/Aero-Holding Chambers (AEROCHAMBER MV) inhaler Use as instructed  . triamterene-hydrochlorothiazide (MAXZIDE-25) 37.5-25 MG per tablet Take 1 tablet by mouth daily.

## 2013-09-28 NOTE — Patient Instructions (Signed)
Take prilosec OTC daily for two weeks.  If no better then keep the ENT appointment We will refer you to ENT for hoarseness and cough We will arrange pulmonary function testing and a repeat high resolution CT chest for 6 months from now and see you after that.  CALL ME FOR AN APPOINTMENT if you get shortness of breath before then

## 2013-09-28 NOTE — Assessment & Plan Note (Signed)
Because she had complete resolution of her cough when she stopped talking I feel that this is directly related to vocal cord irritation. That is do to ongoing postnasal drip as well as acid reflux.  Plan: -Continue sinus regimen -Add proton pump inhibitor -If no improvement with proton pump inhibitor then we will have her see [place] [facility] for further evaluation

## 2013-09-28 NOTE — Assessment & Plan Note (Signed)
I believe that she has significant pulmonary scarring from the episode of pneumonia she experienced in February. I am encouraged by the fact that her shortness of breath has improved in the last month. Clearly, her shortness of breath is going to be complicated by her obesity and deconditioning.  However, her pulmonary function testing was consistent with severe restrictive lung disease which was due in part I believe to both obesity and the scarring in her lungs.  I'm hopeful that this is not an inflammatory (autoimmune) or  progressive process. It's encouraging that she feels better after just 4 weeks of observation alone.  Plan: -Repeat pulmonary function tests in 6 months -Repeat high-resolution CT chest in 6 months -Stay active, exercise, lose weight

## 2013-10-26 ENCOUNTER — Encounter: Payer: Self-pay | Admitting: Internal Medicine

## 2013-10-31 ENCOUNTER — Ambulatory Visit (INDEPENDENT_AMBULATORY_CARE_PROVIDER_SITE_OTHER): Payer: BC Managed Care – PPO | Admitting: Internal Medicine

## 2013-10-31 ENCOUNTER — Encounter: Payer: Self-pay | Admitting: Pulmonary Disease

## 2013-10-31 ENCOUNTER — Encounter: Payer: Self-pay | Admitting: Internal Medicine

## 2013-10-31 VITALS — BP 132/66 | HR 76 | Resp 14 | Ht 65.5 in | Wt 332.8 lb

## 2013-10-31 DIAGNOSIS — D582 Other hemoglobinopathies: Secondary | ICD-10-CM

## 2013-10-31 DIAGNOSIS — I10 Essential (primary) hypertension: Secondary | ICD-10-CM

## 2013-10-31 DIAGNOSIS — E7800 Pure hypercholesterolemia, unspecified: Secondary | ICD-10-CM

## 2013-10-31 DIAGNOSIS — M25569 Pain in unspecified knee: Secondary | ICD-10-CM

## 2013-10-31 DIAGNOSIS — K219 Gastro-esophageal reflux disease without esophagitis: Secondary | ICD-10-CM

## 2013-10-31 DIAGNOSIS — G4733 Obstructive sleep apnea (adult) (pediatric): Secondary | ICD-10-CM

## 2013-10-31 DIAGNOSIS — I4891 Unspecified atrial fibrillation: Secondary | ICD-10-CM

## 2013-10-31 DIAGNOSIS — I498 Other specified cardiac arrhythmias: Secondary | ICD-10-CM

## 2013-10-31 DIAGNOSIS — I480 Paroxysmal atrial fibrillation: Secondary | ICD-10-CM

## 2013-10-31 DIAGNOSIS — F32A Depression, unspecified: Secondary | ICD-10-CM

## 2013-10-31 DIAGNOSIS — R059 Cough, unspecified: Secondary | ICD-10-CM

## 2013-10-31 DIAGNOSIS — J849 Interstitial pulmonary disease, unspecified: Secondary | ICD-10-CM

## 2013-10-31 DIAGNOSIS — R001 Bradycardia, unspecified: Secondary | ICD-10-CM

## 2013-10-31 DIAGNOSIS — F329 Major depressive disorder, single episode, unspecified: Secondary | ICD-10-CM

## 2013-10-31 DIAGNOSIS — J8410 Pulmonary fibrosis, unspecified: Secondary | ICD-10-CM

## 2013-10-31 DIAGNOSIS — Z1239 Encounter for other screening for malignant neoplasm of breast: Secondary | ICD-10-CM

## 2013-10-31 DIAGNOSIS — R05 Cough: Secondary | ICD-10-CM

## 2013-10-31 DIAGNOSIS — F3289 Other specified depressive episodes: Secondary | ICD-10-CM

## 2013-10-31 NOTE — Progress Notes (Signed)
Pre visit review using our clinic review tool, if applicable. No additional management support is needed unless otherwise documented below in the visit note. 

## 2013-11-05 ENCOUNTER — Encounter: Payer: Self-pay | Admitting: Internal Medicine

## 2013-11-05 DIAGNOSIS — M25569 Pain in unspecified knee: Secondary | ICD-10-CM | POA: Insufficient documentation

## 2013-11-05 NOTE — Assessment & Plan Note (Signed)
Blood pressure doing well.  Follow.  

## 2013-11-05 NOTE — Assessment & Plan Note (Signed)
Seeing cardiology.  On diltiazem and flecainide.  Heart rate better.  Follow.     

## 2013-11-05 NOTE — Assessment & Plan Note (Signed)
Low cholesterol diet.  Follow lipid panel.  Remains on crestor.    

## 2013-11-05 NOTE — Assessment & Plan Note (Signed)
Better.  Being followed by pulmonary.

## 2013-11-05 NOTE — Assessment & Plan Note (Signed)
Symptoms controlled.  Same meds.  Follow.   

## 2013-11-05 NOTE — Assessment & Plan Note (Signed)
On zoloft.  Doing better.  On 50mg 1 1/2 tablet q day.  Follow.    

## 2013-11-05 NOTE — Assessment & Plan Note (Signed)
In SR today.  Pulse rate better.  On diltiazem and flecainide.  Continue to f/u with cardiology.  Currently doing better.    

## 2013-11-05 NOTE — Assessment & Plan Note (Signed)
Back using her CPAP now.  Follow.    

## 2013-11-05 NOTE — Progress Notes (Signed)
Subjective:    Patient ID: Kristin [name], female    DOB: 08/03/1950, 63 y.o.   MRN: [id number]  HPI 63 year old female with past history of hypertension, hypercholesterolemia and degenerative joint disease who comes in today for a scheduled follow up.  Was previously treated for pneumonia.  See previous notes for details.  Cough better.  Saw Dr Kendrick FriesMcQuaid.  See his note for details.  Still sob with exertion.  Overall better.   Eating and drinking well.   Is back using CPAP.  Also had issues with bradycardia.  Saw cardiology.  See Darvin Neighboursonna Carroll's note and Dr Philemon KingdomKowalski's note for details.  She is now on diltiazem twice a day.  She reports still feeling herself going in and out of afib.  No significant bradycardia and no significant increased heart rate.  Heart rate has been better.  No nausea or vomiting.  Bowels stable.  Feels better.  Still gets sob with exertion.  Found to have persistent changes on her cxr.  Had CT scan that revealed nonspecific reticular opacities asymmetrically involving the left upper lobe and lingula with associated volume loss.  Described as nonspecific findings.  See CT report for details.  Now being followed by Dr Kendrick FriesMcQuaid.  Has had some increased problems with her knee.  Some swelling.  Planning to f/u with ortho.  Will let me know when ready.  On zoloft.  Stable.         Past Medical History  Diagnosis Date  . Hypertension   . Hypercholesterolemia   . Fibrocystic breast disease   . Degenerative joint disease   . Atrial fibrillation     s/p knee surgery.  maintained in SR since  . GERD (gastroesophageal reflux disease)     Current Outpatient Prescriptions on File Prior to Visit  Medication Sig Dispense Refill  . aspirin 325 MG EC tablet Take 325 mg by mouth daily.      . cetirizine (ZYRTEC) 10 MG tablet Take 10 mg by mouth daily.      . CRESTOR 10 MG tablet TAKE 1 TABLET EVERY DAY  30 tablet  5  . diltiazem (CARDIZEM) 30 MG tablet Take 15 mg by mouth 2 (two) times  daily.      . flecainide (TAMBOCOR) 150 MG tablet Take 150 mg by mouth 2 (two) times daily.      . fluticasone (FLONASE) 50 MCG/ACT nasal spray Place 2 sprays into the nose daily as needed.      . Fluticasone-Salmeterol (ADVAIR DISKUS) 250-50 MCG/DOSE AEPB Inhale 1 puff into the lungs 2 (two) times daily.  60 each  5  . meloxicam (MOBIC) 7.5 MG tablet TAKE ONE TABLET DAILY WITH FOOD  30 tablet  2  . montelukast (SINGULAIR) 10 MG tablet TAKE ONE TABLET AT BEDTIME  30 tablet  5  . Respiratory Therapy Supplies (FLUTTER) DEVI daily.      . sertraline (ZOLOFT) 50 MG tablet TAKE ONE AND ONE-HALF TABLETS DAILY  45 tablet  2  . Spacer/Aero-Holding Chambers (AEROCHAMBER MV) inhaler Use as instructed  1 each  0  . triamterene-hydrochlorothiazide (MAXZIDE-25) 37.5-25 MG per tablet Take 1 tablet by mouth daily.  30 tablet  5   No current facility-administered medications on file prior to visit.    Review of Systems Patient denies any headache or dizziness.  No significant sinus issues.   No chest pain, tightness or palpitations currently. Breathing as outlined.  No nausea or vomiting.  No acid reflux.  No abdominal pain or cramping.  No bowel change, such as diarrhea, constipation, BRBPR or melana.  No urine change.  Feels she is handling stress relatively well.  Feels the zoloft is working well.  Using CPAP regularly now.   Knee pain and swelling as outlined.             Objective:   Physical Exam  Filed Vitals:   10/31/13 1427  BP: 132/66  Pulse: 76  Resp: 42   63 year old female in no acute distress.   HEENT:  Nares- clear.  Oropharynx - without lesions. NECK:  Supple.  Nontender.  No audible bruit.  HEART:  Appears to be regular. LUNGS:  No crackles or wheezing audible.  Respirations even and unlabored.  Good breath sounds bilaterally.  RADIAL PULSE:  Equal bilaterally.   ABDOMEN:  Soft, nontender.  Bowel sounds present and normal.  No audible abdominal bruit.   EXTREMITIES:  No increased  edema present.  DP pulses palpable and equal bilaterally.   MSK:  No increased erythema of knee.  No significant pain with full flexion or extension.  Pain with weight bearing.          Assessment & Plan:  HEMATOLOGY.  Previous H&H increased.  Saw Dr Orlie Dakin.  Recommended no further w/up unless hgb remaining above 18.  Hgb has improved with the use of her CPAP.  Follow.    GI.  Previous abdominal discomfort.  She related to increased stress.  H. Pylori negative.  She continues to decline colonoscopy.  Currently doing well with no abdominal pain.  Follow.   INCREASED PSYCHOSOCIAL STRESSORS.  Feels she is handling things relatively well.  Follow.  Now on Zoloft  50mg  1 1/2 tablet q day.    HEALTH MAINTENANCE.  Physical 09/13/12.  Pap ok.  GI as above.  Mammogram 02/17/11 - BiRADS II.  Mammogram 03/28/12 recommended follow up views.  Follow up views 04/05/12 - Birads II.  Has been scheduled a f/u mammogram.  Needs.  Schedule a physical next visit.    I spent 25 minutes with the patient and more than 50% of the time was spent in consultation regarding the above.

## 2013-11-05 NOTE — Assessment & Plan Note (Signed)
Some persistent intermittent flares.  Pain with weight bearing.  Plans to f/u with ortho.

## 2013-11-05 NOTE — Assessment & Plan Note (Signed)
Being followed by Dr Kendrick FriesMcQuaid.  Breathing stable - some better.

## 2013-11-24 ENCOUNTER — Other Ambulatory Visit: Payer: Self-pay | Admitting: Internal Medicine

## 2013-12-21 ENCOUNTER — Other Ambulatory Visit: Payer: Self-pay | Admitting: Internal Medicine

## 2013-12-27 ENCOUNTER — Ambulatory Visit: Payer: Self-pay | Admitting: Internal Medicine

## 2014-01-04 ENCOUNTER — Ambulatory Visit: Payer: Self-pay | Admitting: Internal Medicine

## 2014-01-04 LAB — HM MAMMOGRAPHY: HM Mammogram: NEGATIVE

## 2014-01-05 ENCOUNTER — Encounter: Payer: Self-pay | Admitting: Internal Medicine

## 2014-01-08 ENCOUNTER — Encounter: Payer: Self-pay | Admitting: Pulmonary Disease

## 2014-01-08 ENCOUNTER — Ambulatory Visit (INDEPENDENT_AMBULATORY_CARE_PROVIDER_SITE_OTHER): Payer: BC Managed Care – PPO | Admitting: Pulmonary Disease

## 2014-01-08 VITALS — BP 126/72 | HR 67 | Ht 65.5 in | Wt 338.0 lb

## 2014-01-08 DIAGNOSIS — G4733 Obstructive sleep apnea (adult) (pediatric): Secondary | ICD-10-CM

## 2014-01-08 DIAGNOSIS — J849 Interstitial pulmonary disease, unspecified: Secondary | ICD-10-CM

## 2014-01-08 LAB — CBC WITH DIFFERENTIAL/PLATELET
BASOS ABS: 0.1 10*3/uL (ref 0.0–0.1)
Basophils Relative: 0.8 % (ref 0.0–3.0)
EOS PCT: 6.6 % — AB (ref 0.0–5.0)
Eosinophils Absolute: 0.6 10*3/uL (ref 0.0–0.7)
HCT: 49.3 % — ABNORMAL HIGH (ref 36.0–46.0)
Hemoglobin: 16 g/dL — ABNORMAL HIGH (ref 12.0–15.0)
Lymphocytes Relative: 18.4 % (ref 12.0–46.0)
Lymphs Abs: 1.6 10*3/uL (ref 0.7–4.0)
MCHC: 32.4 g/dL (ref 30.0–36.0)
MCV: 88.8 fl (ref 78.0–100.0)
MONOS PCT: 6.9 % (ref 3.0–12.0)
Monocytes Absolute: 0.6 10*3/uL (ref 0.1–1.0)
NEUTROS PCT: 67.3 % (ref 43.0–77.0)
Neutro Abs: 5.7 10*3/uL (ref 1.4–7.7)
PLATELETS: 232 10*3/uL (ref 150.0–400.0)
RBC: 5.56 Mil/uL — ABNORMAL HIGH (ref 3.87–5.11)
RDW: 14 % (ref 11.5–15.5)
WBC: 8.5 10*3/uL (ref 4.0–10.5)

## 2014-01-08 LAB — SEDIMENTATION RATE: Sed Rate: 17 mm/hr (ref 0–22)

## 2014-01-08 NOTE — Assessment & Plan Note (Addendum)
I reviewed the images from the CT scan from March in clinic in detail with her and her daughter. Again, there is a fibrotic process in the left upper lobe as well as in the left lower lobe. There are very scant fibrotic changes in the right base. These changes are nonspecific and it is possible that they are related to the severe respiratory illness she suffered back in February.  However, I am not happy about the fact that she seems to be experiencing worsening shortness of breath. The cyanotic changes in her fingers are concerning as well and even though we were able to reproduce them here in clinic today she did not have hypoxemic respiratory failure despite repeated episodes of ambulation with oximetry monitoring.  I explained to her and her daughter that the differential diagnosis here is either scarring from the episode of severe community acquired pneumonia she had in February 2015 or alternatively an entirely different primary pulmonary process such as an NSIP or UIP. Ultimately we may need to perform an open lung biopsy to get a diagnosis.  Certainly her obesity is contributing to her dyspnea. The fact that she has gained 20 pounds in the last 5 months is concerning and contributing to her symptoms.  Plan: -Repeat full pulmonary function testing -Obtain room air ABG -Repeat high-resolution CT scan -Obtain ILD serologic panel - Diet and weight loss were strongly encouraged -Followup in 2-3 weeks to go over these results, we may need to perform an open lung biopsy

## 2014-01-08 NOTE — Assessment & Plan Note (Signed)
Her daughter raises the concern that her CPAP may not be adequate considering her daytime fatigue and a significant weight gain. This is a very reasonable possibility.  Plan, -Repeat CPAP titration study

## 2014-01-08 NOTE — Progress Notes (Signed)
Subjective:    Patient ID: Kristin [name], female    DOB: 07/29/1950, 63 y.o.   MRN: [id number]  Synopsis: Kristin [name] first saw [facility] in 2015 port chest x-ray suggestive of interstitial lung disease. She never had a prior pulmonary diagnosis but in February of 2015 she had a very severe episode of community acquired pneumonia. Checks he waited for several weeks before she was hospitalized for this. The symptoms lasted for months. May 2015 CT chest> left greater than right interstitial changes throughout the left upper lobe as well as in the left base, question focal areas of bronchiectasis in the lingula, basilar very mild interstitial changes in a peripheral distribution in the right lung overall findings are nonspecific 08/2013 PFT> ratio 74%, FEV1 1.66L (74% pred, 1% change), TLC 2.98L (59% pred), ERV 0.03 (3% pred), DLCO 14.2 (48% pred)  HPI  Chief Complaint  Patient presents with  . Follow-up    Pt c/o worsening SOB with any exertion, hoarseness, nonprod cough.  States yesterday that her "hands were blue" but not painful, states it's happened before.   States she cannot blow hard into her flutter valve like she could before.      01/08/2014 ROV > Kristin [name] says that she still feels pretty short of breath.  She gets dyspnic with just walking around the house.  Yesterday she noticed that her fingers were blue.  They have also noted that her lips were blue. She feels tightness in her chest, but no pain.  She coughs a lot and she doesn't get anything up.  She doesn't feel sinus drainage during the daytime but she notes some sinus congestion when she lies on her side.  She takes singulair and an antihistamine daily. She has been using her CPAP fairly regularly but because of the sinus congestion she has difficulty using it consistently. She has been coughing up more mucus lately. She continues to use Advair twice a day. She feels that her dyspnea has been progressing. She  notes that she has gained 20 pounds in the last several months and she saw me the last time. She does not report chest pain. She has not been exercising regularly.  Past Medical History  Diagnosis Date  . Hypertension   . Hypercholesterolemia   . Fibrocystic breast disease   . Degenerative joint disease   . Atrial fibrillation     s/p knee surgery.  maintained in SR since  . GERD (gastroesophageal reflux disease)      Review of Systems  Constitutional: Negative for fever, chills and fatigue.  HENT: Positive for postnasal drip, rhinorrhea and sinus pressure.   Respiratory: Positive for cough and shortness of breath. Negative for wheezing.   Cardiovascular: Positive for leg swelling. Negative for chest pain and palpitations.       Objective:   Physical Exam  Filed Vitals:   01/08/14 1344  BP: 126/72  Pulse: 67  Height: 5' 5.5" (1.664 m)  Weight: 338 lb (153.316 kg)  SpO2: 94%   Ambulated 500 feet on room air and oxygen saturation remained at 88% or above  Gen: Obese but well appearing, no acute distress HEENT: NCAT,  EOMi, OP clear, PULM: Crackles throughout left upper lobe, clear to auscultation in the right lung CV: RRR, no mgr, no JVD AB: BS+, soft, nontender, no hsm Ext: warm, trace edema, no clubbing, cyanosis was reproduced on ambulation but she was not hypoxemic by oximetry testing at the time Derm: no rash or skin  breakdown  May 2015 CT chest> left greater than right interstitial changes throughout the left upper lobe as well as in the left base, question focal areas of bronchiectasis in the lingula, basilar very mild interstitial changes in a peripheral distribution in the right lung overall findings are nonspecific 08/2013 PFT> ratio 74%, FEV1 1.66L (74% pred, 1% change), TLC 2.98L (59% pred), ERV 0.03 (3% pred), DLCO 14.2 (48% pred)     Assessment & Plan:   ILD (interstitial lung disease) I reviewed the images from the CT scan from March in clinic in detail  with her and her daughter. Again, there is a fibrotic process in the left upper lobe as well as in the left lower lobe. There are very scant fibrotic changes in the right base. These changes are nonspecific and it is possible that they are related to the severe respiratory illness she suffered back in February.  However, I am not happy about the fact that she seems to be experiencing worsening shortness of breath. The cyanotic changes in her fingers are concerning as well and even though we were able to reproduce them here in clinic today she did not have hypoxemic respiratory failure despite repeated episodes of ambulation with oximetry monitoring.  I explained to her and her daughter that the differential diagnosis here is either scarring from the episode of severe community acquired pneumonia she had in February 2015 or alternatively an entirely different primary pulmonary process such as an NSIP or UIP. Ultimately we may need to perform an open lung biopsy to get a diagnosis.  Certainly her obesity is contributing to her dyspnea. The fact that she has gained 20 pounds in the last 5 months is concerning and contributing to her symptoms.  Plan: -Repeat full pulmonary function testing -Obtain room air ABG -Repeat high-resolution CT scan -Obtain ILD serologic panel - Diet and weight loss were strongly encouraged -Followup in 2-3 weeks to go over these results, we may need to perform an open lung biopsy  Obstructive sleep apnea Her daughter raises the concern that her CPAP may not be adequate considering her daytime fatigue and a significant weight gain. This is a very reasonable possibility.  Plan, -Repeat CPAP titration study    Updated Medication List Outpatient Encounter Prescriptions as of 01/08/2014  Medication Sig  . aspirin 325 MG EC tablet Take 325 mg by mouth daily.  . cetirizine (ZYRTEC) 10 MG tablet Take 10 mg by mouth daily.  . cholecalciferol (VITAMIN D) 1000 UNITS tablet  Take 1,000 Units by mouth daily.  . CRESTOR 10 MG tablet TAKE 1 TABLET EVERY DAY  . diltiazem (CARDIZEM) 30 MG tablet Take 15 mg by mouth 2 (two) times daily.  . flecainide (TAMBOCOR) 150 MG tablet Take 150 mg by mouth 2 (two) times daily.  . fluticasone (FLONASE) 50 MCG/ACT nasal spray Place 2 sprays into the nose daily as needed.  . fluticasone (FLOVENT DISKUS) 50 MCG/BLIST diskus inhaler Inhale 1 puff into the lungs 2 (two) times daily as needed.  . Fluticasone-Salmeterol (ADVAIR DISKUS) 250-50 MCG/DOSE AEPB Inhale 1 puff into the lungs 2 (two) times daily.  . meloxicam (MOBIC) 7.5 MG tablet TAKE ONE TABLET DAILY WITH FOOD  . montelukast (SINGULAIR) 10 MG tablet TAKE ONE TABLET AT BEDTIME  . omeprazole (PRILOSEC OTC) 20 MG tablet Take 20 mg by mouth daily.  Marland Kitchen Respiratory Therapy Supplies (FLUTTER) DEVI daily.  . sertraline (ZOLOFT) 50 MG tablet TAKE ONE AND ONE-HALF TABLETS DAILY  . Spacer/Aero-Holding Chambers (AEROCHAMBER  MV) inhaler Use as instructed  . triamterene-hydrochlorothiazide (MAXZIDE-25) 37.5-25 MG per tablet Take 1 tablet by mouth daily.

## 2014-01-08 NOTE — Patient Instructions (Signed)
We will arrange blood work, a pulmonary function test and a CT scan and see you back after that We will also arrange a CPAP titration study We will see you back in 2-3 weeks either here in [place] or in [place]

## 2014-01-09 LAB — SJOGRENS SYNDROME-B EXTRACTABLE NUCLEAR ANTIBODY: SSB (LA) (ENA) ANTIBODY, IGG: NEGATIVE

## 2014-01-09 LAB — SJOGRENS SYNDROME-A EXTRACTABLE NUCLEAR ANTIBODY
SSA (RO) (ENA) ANTIBODY, IGG: NEGATIVE
SSA (Ro) (ENA) Antibody, IgG: <1

## 2014-01-09 LAB — RHEUMATOID FACTOR: Rhuematoid fact SerPl-aCnc: <10 IU/mL (ref <=14)

## 2014-01-09 LAB — ANTI-SCLERODERMA ANTIBODY: Scleroderma (Scl-70) (ENA) Antibody, IgG: <1

## 2014-01-09 LAB — ANA: Anti Nuclear Antibody(ANA): NEGATIVE

## 2014-01-09 LAB — ANTI-JO 1 ANTIBODY, IGG

## 2014-01-09 LAB — CENTROMERE ANTIBODIES: Centromere Ab Screen: <1

## 2014-01-09 LAB — C-REACTIVE PROTEIN: CRP: 1 mg/dL (ref 0.5–20.0)

## 2014-01-10 LAB — CYCLIC CITRUL PEPTIDE ANTIBODY, IGG

## 2014-01-12 ENCOUNTER — Telehealth: Payer: Self-pay | Admitting: Pulmonary Disease

## 2014-01-12 NOTE — Telephone Encounter (Signed)
BQ  The pt cannot have the o2 home eval with [facility] since they are the ones that supply her oxygen.  Ok to send this through another DME company?  thanks

## 2014-01-15 LAB — HYPERSENSITIVITY PNUEMONITIS PROFILE

## 2014-01-15 NOTE — Telephone Encounter (Signed)
Will forward to pcc's to make them aware of this.  PCC's, please let me know if I need to place another order for home 02 eval since we will be needing to use another dme provider.  Thank you!

## 2014-01-15 NOTE — Telephone Encounter (Signed)
Yes fine by me 

## 2014-01-16 ENCOUNTER — Ambulatory Visit: Payer: Self-pay | Admitting: Pulmonary Disease

## 2014-01-16 LAB — PULMONARY FUNCTION TEST

## 2014-01-16 LAB — ALDOLASE

## 2014-01-19 ENCOUNTER — Encounter: Payer: Self-pay | Admitting: Pulmonary Disease

## 2014-01-22 ENCOUNTER — Telehealth: Payer: Self-pay

## 2014-01-22 ENCOUNTER — Other Ambulatory Visit: Payer: Self-pay | Admitting: Internal Medicine

## 2014-01-22 ENCOUNTER — Ambulatory Visit (INDEPENDENT_AMBULATORY_CARE_PROVIDER_SITE_OTHER): Payer: BC Managed Care – PPO | Admitting: Internal Medicine

## 2014-01-22 VITALS — BP 122/84 | HR 75 | Temp 98.2°F | Resp 16 | Ht 65.5 in | Wt 337.0 lb

## 2014-01-22 DIAGNOSIS — R059 Cough, unspecified: Secondary | ICD-10-CM

## 2014-01-22 DIAGNOSIS — I10 Essential (primary) hypertension: Secondary | ICD-10-CM

## 2014-01-22 DIAGNOSIS — I480 Paroxysmal atrial fibrillation: Secondary | ICD-10-CM

## 2014-01-22 DIAGNOSIS — R05 Cough: Secondary | ICD-10-CM

## 2014-01-22 MED ORDER — PREDNISONE 10 MG PO TABS: ORAL_TABLET | ORAL | Status: DC

## 2014-01-22 MED ORDER — AMOXICILLIN-POT CLAVULANATE 875-125 MG PO TABS: 1.0000 | ORAL_TABLET | Freq: Two times a day (BID) | ORAL | Status: DC

## 2014-01-22 NOTE — Telephone Encounter (Signed)
Message copied by Velvet BatheAULFIELD, Kengo Sturges L on Mon Jan 22, 2014  4:56 PM ------      Message from: Lupita LeashMCQUAID, DOUGLAS B      Created: Fri Jan 19, 2014  5:13 PM       A,      Please let her know that the CT scan showed some progression of her ILD and we need to discuss this in clinic.  Please make sure she has an appointment with me in the next 1-2 weeks      Thanks      B ------

## 2014-01-22 NOTE — Telephone Encounter (Signed)
Pt coming today @ 1:15.

## 2014-01-22 NOTE — Patient Instructions (Signed)
Use the flonase and saline nasal spray as directed.    Take the antibiotic twice a day.    Prednisone taper as directed.

## 2014-01-22 NOTE — Telephone Encounter (Signed)
The patient called and stated she is has a deep, "barky", chest cough.  She mentioned that she was instructed to notify Dr.Scott when this cough occurred.

## 2014-01-22 NOTE — Telephone Encounter (Signed)
I can see her at 1:15 today as a work in for this or she may want to call pulmonary and get their advice since following with them.

## 2014-01-22 NOTE — Progress Notes (Signed)
Pre visit review using our clinic review tool, if applicable. No additional management support is needed unless otherwise documented below in the visit note. 

## 2014-01-22 NOTE — Telephone Encounter (Signed)
Pt aware of ct chest results.  Pt has an appt on 11/2.  Nothing further needed.

## 2014-01-28 ENCOUNTER — Encounter: Payer: Self-pay | Admitting: Internal Medicine

## 2014-01-28 NOTE — Progress Notes (Signed)
Subjective:    Patient ID: Kristin [name], female    DOB: 10/26/1950, 63 y.o.   MRN: [id number]  Cough  63 year old female with past history of hypertension, hypercholesterolemia and degenerative joint disease who comes in today as a work in with concerns regarding increased cough and congestion.  Seeing Dr Kendrick FriesMcQuaid.  See his note for details.  Concern over ILD.  She reports symptoms started five days ago.  Started with runny nose and hoarseness.  Then developed increased cough and drainage.  Ears were stopped up.  Better now.  Increased chest congestion and cough.  Using her advair.  Does not want to use her proventil for fear it may increase her heart rate.  No vomiting.  No diarrhea.  Has noticed some persistent intermittent afib.         Past Medical History  Diagnosis Date  . Hypertension   . Hypercholesterolemia   . Fibrocystic breast disease   . Degenerative joint disease   . Atrial fibrillation     s/p knee surgery.  maintained in SR since  . GERD (gastroesophageal reflux disease)     Current Outpatient Prescriptions on File Prior to Visit  Medication Sig Dispense Refill  . aspirin 325 MG EC tablet Take 325 mg by mouth daily.    . cetirizine (ZYRTEC) 10 MG tablet Take 10 mg by mouth daily.    . cholecalciferol (VITAMIN D) 1000 UNITS tablet Take 1,000 Units by mouth daily.    . CRESTOR 10 MG tablet TAKE 1 TABLET EVERY DAY 30 tablet 5  . diltiazem (CARDIZEM) 30 MG tablet Take 15 mg by mouth 2 (two) times daily.    . flecainide (TAMBOCOR) 150 MG tablet Take 150 mg by mouth 2 (two) times daily.    . fluticasone (FLONASE) 50 MCG/ACT nasal spray Place 2 sprays into the nose daily as needed.    . fluticasone (FLOVENT DISKUS) 50 MCG/BLIST diskus inhaler Inhale 1 puff into the lungs 2 (two) times daily as needed.    . Fluticasone-Salmeterol (ADVAIR DISKUS) 250-50 MCG/DOSE AEPB Inhale 1 puff into the lungs 2 (two) times daily. 60 each 5  . omeprazole (PRILOSEC OTC) 20 MG tablet Take 20  mg by mouth daily.    Marland Kitchen. Respiratory Therapy Supplies (FLUTTER) DEVI daily.    Marland Kitchen. Spacer/Aero-Holding Chambers (AEROCHAMBER MV) inhaler Use as instructed 1 each 0  . triamterene-hydrochlorothiazide (MAXZIDE-25) 37.5-25 MG per tablet Take 1 tablet by mouth daily. 30 tablet 5   No current facility-administered medications on file prior to visit.    Review of Systems  Respiratory: Positive for cough.   Patient denies any headache or dizziness.  No significant sinus issues.  Some runny nose and drainage.  No chest pain.  Some episodes of increased heart rate intermittently.   Breathing as outlined.  Increased cough and congestion.  See above.  No nausea or vomiting.  No acid reflux.  No abdominal pain or cramping.  No bowel change, such as diarrhea.  Using her advair.              Objective:   Physical Exam  Filed Vitals:   01/22/14 1309  BP: 122/84  Pulse: 75  Temp: 98.2 F (36.8 C)  Resp: 5716   63 year old female in no acute distress.   HEENT:  Nares- clear.  Oropharynx - without lesions. NECK:  Supple.  Nontender.   HEART:  Appears to be regular.  Rate controlled.   LUNGS:  No crackles.  Some increased cough and wheezing with forced end expiratory wheezing.          Assessment & Plan:  1. Paroxysmal atrial fibrillation Still has episodes where she feels she is going in and out of afib.  Sees cardiology.  In SR today.  Earlier f/u with cardiology.   2. Essential hypertension Blood pressure doing well.  Follow.   3. Cough Increased cough and congestion as outlined.  URI vs bronchitis. Hold on cxr.  Treat with augmentin as directed.  Take probiotic with the abx.  Prednisone taper as directed.  advair as directed. Follow.  Has a f/u appt with Dr Kendrick FriesMcQuaid next week.    HEALTH MAINTENANCE.  Physical 09/13/12.  Pap ok.  GI as above.  Mammogram 02/17/11 - BiRADS II.  Mammogram 03/28/12 recommended follow up views.  Follow up views 04/05/12 - Birads II.  Mammogram 01/04/14 - Birads I.   Scheduled for a physical next visit.

## 2014-01-29 ENCOUNTER — Encounter: Payer: Self-pay | Admitting: Pulmonary Disease

## 2014-01-29 ENCOUNTER — Ambulatory Visit (INDEPENDENT_AMBULATORY_CARE_PROVIDER_SITE_OTHER): Payer: BC Managed Care – PPO | Admitting: Pulmonary Disease

## 2014-01-29 ENCOUNTER — Encounter: Payer: Self-pay | Admitting: Internal Medicine

## 2014-01-29 VITALS — BP 122/80 | HR 64 | Temp 98.3°F | Ht 65.5 in | Wt 345.2 lb

## 2014-01-29 DIAGNOSIS — G4733 Obstructive sleep apnea (adult) (pediatric): Secondary | ICD-10-CM

## 2014-01-29 DIAGNOSIS — J849 Interstitial pulmonary disease, unspecified: Secondary | ICD-10-CM

## 2014-01-29 NOTE — Progress Notes (Signed)
Subjective:    Patient ID: Kristin [name], female    DOB: 04/30/1950, 63 y.o.   MRN: [id number]  Synopsis: Ms. Kristin [name] first saw Kristin [name] [facility] in 2015 port chest x-ray suggestive of interstitial lung disease. She never had a prior pulmonary diagnosis but in February of 2015 she had a very severe episode of community acquired pneumonia. Checks he waited for several weeks before she was hospitalized for this. The symptoms lasted for months. May 2015 CT chest> left greater than right interstitial changes throughout the left upper lobe as well as in the left base, question focal areas of bronchiectasis in the lingula, basilar very mild interstitial changes in a peripheral distribution in the right lung overall findings are nonspecific 08/2013 PFT> ratio 74%, FEV1 1.66L (74% pred, 1% change), TLC 2.98L (59% pred), ERV 0.03 (3% pred), DLCO 14.2 (48% pred) 12/2013 CBC with diff > 6.6% relative eos, absolute 600/uL 12/2013 PFT> Ratio 75%, FEV1 1.47L (65% pred, 1% change), TLC 2.77L (55% pred), DLCO 11.7 (38% pred) 12/2013 CT chest > progressive ILD changes compared to previous study.  New Right up    HPI  Chief Complaint  Patient presents with  . Follow-up    Review CT, PFT and CPAP titration study. Pt c/o SOB-- no change since last OV.    01/29/2014 ROV> Earley AbideHilda says that the brething is about the same lately.  She had a cold recently with a lot of cough and runny nose and drainage.  She is still coughing up mucus.  She saw her PCP and she took augmentin and prednisone which helped some.  She remains very sensitive to any odor, perfume, air freshners which give her tightness in her chest.  She was on prednisone for 5 days.  She has one more day of prednisone to take today. She says that her breathing has improved since taking that medicine. Otherwise she has not had a significant change in her breathing since the last visit. Aside from the recent cold she has not had a significant cough  up until this illness. She has been using her CPAP machine regularly and she says that she likes the way that it feels. She does feel more rested during the daytime.  Past Medical History  Diagnosis Date  . Hypertension   . Hypercholesterolemia   . Fibrocystic breast disease   . Degenerative joint disease   . Atrial fibrillation     s/p knee surgery.  maintained in SR since  . GERD (gastroesophageal reflux disease)      Review of Systems  Constitutional: Negative for fever, chills and fatigue.  HENT: Negative for postnasal drip, rhinorrhea and sinus pressure.   Respiratory: Positive for cough and shortness of breath. Negative for wheezing.   Cardiovascular: Negative for chest pain, palpitations and leg swelling.       Objective:   Physical Exam  Filed Vitals:   01/29/14 1429  BP: 122/80  Pulse: 64  Temp: 98.3 F (36.8 C)  TempSrc: Oral  Height: 5' 5.5" (1.664 m)  Weight: 345 lb 3.2 oz (156.582 kg)  SpO2: 92%   RA  Gen: Obese but well appearing, no acute distress HEENT: NCAT,  EOMi, OP clear, PULM: Crackles throughout left upper lobe, clear to auscultation in the right lung CV: RRR, no mgr, no JVD AB: BS+, soft, nontender, no hsm Ext: warm, trace edema, no clubbing,  Derm: no rash or skin breakdown  May 2015 CT chest> left greater than right interstitial changes throughout  the left upper lobe as well as in the left base, question focal areas of bronchiectasis in the lingula, basilar very mild interstitial changes in a peripheral distribution in the right lung overall findings are nonspecific 08/2013 PFT> ratio 74%, FEV1 1.66L (74% pred, 1% change), TLC 2.98L (59% pred), ERV 0.03 (3% pred), DLCO 14.2 (48% pred)     Assessment & Plan:   ILD (interstitial lung disease) I am very concerned about Kristin [name]. She has had worsening symptoms and her recent CT chest showed progressive interstitial lung disease changes compared to the prior study. Further, her total lung capacity  and DLCO were slightly decreased on the  Repeat pulmonary function test.   On my review of the images from her CT chest a do not see a distinct pattern which fits a particular etiology of interstitial lung disease. She does not have serologic evidence of connective tissue disease nor does she have evidence of it on history.  I remain concerned about the possibility that this is just scarring from her previous episode of pneumonia, but the fact that her disease has progressed is worrisome that there is an underlying true pulmonary problem.   The only possible clue that I have seen this point is the fact that her serum eosinophils have been slightly elevated on 2 separate measurements. However, the CT findings are not consistent with  Chronic eosinophilic pneumonia. If this were chronic eosinophilic pneumonia then these changes would nearly disappear with treatment with prednisone.  Plan: -I explained to the patient and her family at length today that I think we have one of 2 options: Either treat with prednisone for 6 weeks and repeat a CT chest to see if the findings go away (with the remote possibility of chronic eosinophilic pneumonia). Or,  Option #2 we proceed with an open lung biopsy at this time.  It was evident today in clinic that they need more time to think about this. I have asked them to contact me once they have made up their minds.  More than 25 minutes were spent in direct consultation with the patient and her family answering multiple questions about her condition.  Obstructive sleep apnea Continue CPAP with the auto titrating device. I will obtain an ON O on room air. I will await the download from the CPAP machine.    Updated Medication List Outpatient Encounter Prescriptions as of 01/29/2014  Medication Sig  . amoxicillin-clavulanate (AUGMENTIN) 875-125 MG per tablet Take 1 tablet by mouth 2 (two) times daily.  Marland Kitchen aspirin 325 MG EC tablet Take 325 mg by mouth daily.  .  cetirizine (ZYRTEC) 10 MG tablet Take 10 mg by mouth daily.  . cholecalciferol (VITAMIN D) 1000 UNITS tablet Take 1,000 Units by mouth daily.  . CRESTOR 10 MG tablet TAKE 1 TABLET EVERY DAY  . diltiazem (CARDIZEM) 30 MG tablet Take 15 mg by mouth 2 (two) times daily.  . flecainide (TAMBOCOR) 150 MG tablet Take 150 mg by mouth 2 (two) times daily.  . fluticasone (FLONASE) 50 MCG/ACT nasal spray Place 2 sprays into the nose daily as needed.  . Fluticasone-Salmeterol (ADVAIR DISKUS) 250-50 MCG/DOSE AEPB Inhale 1 puff into the lungs 2 (two) times daily.  . meloxicam (MOBIC) 7.5 MG tablet TAKE ONE TABLET DAILY WITH FOOD  . montelukast (SINGULAIR) 10 MG tablet TAKE ONE TABLET AT BEDTIME  . omeprazole (PRILOSEC OTC) 20 MG tablet Take 20 mg by mouth daily.  . predniSONE (DELTASONE) 10 MG tablet Take 4 tablets x 1  day and then decrease by 1/2 tablet per day until down to zero mg.  . Respiratory Therapy Supplies (FLUTTER) DEVI daily.  . sertraline (ZOLOFT) 50 MG tablet TAKE ONE AND ONE-HALF TABLETS DAILY  . Spacer/Aero-Holding Chambers (AEROCHAMBER MV) inhaler Use as instructed  . triamterene-hydrochlorothiazide (MAXZIDE-25) 37.5-25 MG per tablet Take 1 tablet by mouth daily.  . [DISCONTINUED] fluticasone (FLOVENT DISKUS) 50 MCG/BLIST diskus inhaler Inhale 1 puff into the lungs 2 (two) times daily as needed.

## 2014-01-29 NOTE — Assessment & Plan Note (Addendum)
I am very concerned about Kristin [name]. She has had worsening symptoms and her recent CT chest showed progressive interstitial lung disease changes compared to the prior study. Further, her total lung capacity and DLCO were slightly decreased on the  Repeat pulmonary function test.   On my review of the images from her CT chest a do not see a distinct pattern which fits a particular etiology of interstitial lung disease. She does not have serologic evidence of connective tissue disease nor does she have evidence of it on history.  I remain concerned about the possibility that this is just scarring from her previous episode of pneumonia, but the fact that her disease has progressed is worrisome that there is an underlying true pulmonary problem.   The only possible clue that I have seen this point is the fact that her serum eosinophils have been slightly elevated on 2 separate measurements. However, the CT findings are not consistent with  Chronic eosinophilic pneumonia. If this were chronic eosinophilic pneumonia then these changes would nearly disappear with treatment with prednisone.  Plan: -I explained to the patient and her family at length today that I think we have one of 2 options: Either treat with prednisone for 6 weeks and repeat a CT chest to see if the findings go away (with the remote possibility of chronic eosinophilic pneumonia). Or,  Option #2 we proceed with an open lung biopsy at this time.  It was evident today in clinic that they need more time to think about this. I have asked them to contact me once they have made up their minds.  More than 25 minutes were spent in direct consultation with the patient and her family answering multiple questions about her condition.

## 2014-01-29 NOTE — Patient Instructions (Signed)
We will arrange an overnight oximetry test on the CPAP machine Call me when you have made a decision about whether or not you want to have the open lung biopsy or the prednisone.

## 2014-01-29 NOTE — Assessment & Plan Note (Signed)
Continue CPAP with the auto titrating device. I will obtain an ON O on room air. I will await the download from the CPAP machine.

## 2014-01-31 ENCOUNTER — Ambulatory Visit (INDEPENDENT_AMBULATORY_CARE_PROVIDER_SITE_OTHER): Payer: BC Managed Care – PPO | Admitting: Internal Medicine

## 2014-01-31 ENCOUNTER — Telehealth: Payer: Self-pay | Admitting: Pulmonary Disease

## 2014-01-31 ENCOUNTER — Encounter: Payer: Self-pay | Admitting: Internal Medicine

## 2014-01-31 VITALS — BP 110/60 | HR 61 | Temp 98.1°F | Ht 65.5 in | Wt 336.0 lb

## 2014-01-31 DIAGNOSIS — K219 Gastro-esophageal reflux disease without esophagitis: Secondary | ICD-10-CM

## 2014-01-31 DIAGNOSIS — E7800 Pure hypercholesterolemia, unspecified: Secondary | ICD-10-CM

## 2014-01-31 DIAGNOSIS — G4733 Obstructive sleep apnea (adult) (pediatric): Secondary | ICD-10-CM

## 2014-01-31 DIAGNOSIS — J849 Interstitial pulmonary disease, unspecified: Secondary | ICD-10-CM

## 2014-01-31 DIAGNOSIS — I480 Paroxysmal atrial fibrillation: Secondary | ICD-10-CM

## 2014-01-31 DIAGNOSIS — M8580 Other specified disorders of bone density and structure, unspecified site: Secondary | ICD-10-CM

## 2014-01-31 DIAGNOSIS — I10 Essential (primary) hypertension: Secondary | ICD-10-CM

## 2014-01-31 LAB — TSH: TSH: 2.78 u[IU]/mL (ref 0.35–4.50)

## 2014-01-31 LAB — BASIC METABOLIC PANEL
BUN: 19 mg/dL (ref 6–23)
CO2: 24 mEq/L (ref 19–32)
Calcium: 9 mg/dL (ref 8.4–10.5)
Chloride: 103 mEq/L (ref 96–112)
Creatinine, Ser: 1 mg/dL (ref 0.4–1.2)
GFR: 56.57 mL/min — AB (ref >60.00)
Glucose, Bld: 81 mg/dL (ref 70–99)
Potassium: 4.2 mEq/L (ref 3.5–5.1)
SODIUM: 139 meq/L (ref 135–145)

## 2014-01-31 LAB — HEPATIC FUNCTION PANEL
ALBUMIN: 3.1 g/dL — AB (ref 3.5–5.2)
ALT: 23 U/L (ref 0–35)
AST: 21 U/L (ref 0–37)
Alkaline Phosphatase: 65 U/L (ref 39–117)
Bilirubin, Direct: 0.2 mg/dL (ref 0.0–0.3)
Total Bilirubin: 0.8 mg/dL (ref 0.2–1.2)
Total Protein: 6.9 g/dL (ref 6.0–8.3)

## 2014-01-31 LAB — VITAMIN D 25 HYDROXY (VIT D DEFICIENCY, FRACTURES): VITD: 17.17 ng/mL — ABNORMAL LOW (ref 30.00–100.00)

## 2014-01-31 LAB — LIPID PANEL
Cholesterol: 181 mg/dL (ref 0–200)
HDL: 64.3 mg/dL (ref >39.00)
LDL Cholesterol: 104 mg/dL — ABNORMAL HIGH (ref 0–99)
NonHDL: 116.7
TRIGLYCERIDES: 65 mg/dL (ref 0.0–149.0)
Total CHOL/HDL Ratio: 3
VLDL: 13 mg/dL (ref 0.0–40.0)

## 2014-01-31 NOTE — Telephone Encounter (Signed)
Called and spoke to pt. Pt stated she has decided to move forward with the open lung biopsy route. Pt last seen by BQ on 01/29/14. Due to time sensitivity of this issue, will mark urgent.   BQ please advise.   Patient Instructions     We will arrange an overnight oximetry test on the CPAP machine Call me when you have made a decision about whether or not you want to have the open lung biopsy or the prednisone.

## 2014-01-31 NOTE — Telephone Encounter (Signed)
Called spoke with Victorino DikeJennifer. She reports they are faxing over CMN for BQ to sign/ FYI for alida

## 2014-01-31 NOTE — Progress Notes (Signed)
Pre visit review using our clinic review tool, if applicable. No additional management support is needed unless otherwise documented below in the visit note. 

## 2014-01-31 NOTE — Telephone Encounter (Signed)
I have cmn from fax [facility], will put in Dr Kendrick FriesMcQuaid cmn to sign folder .Kandice HamsAlida Elisabella Hacker

## 2014-02-01 ENCOUNTER — Encounter: Payer: Self-pay | Admitting: Internal Medicine

## 2014-02-01 ENCOUNTER — Encounter: Payer: Self-pay | Admitting: Pulmonary Disease

## 2014-02-02 ENCOUNTER — Other Ambulatory Visit: Payer: BC Managed Care – PPO

## 2014-02-02 ENCOUNTER — Other Ambulatory Visit: Payer: BC Managed Care – PPO | Admitting: Internal Medicine

## 2014-02-02 NOTE — Telephone Encounter (Signed)
LMTCB for pt.  Referral order placed.

## 2014-02-02 NOTE — Telephone Encounter (Signed)
OK, please place a consult with Dr. Dorris FetchHendrickson with CardioThoracic Surgery Reason: VATS for interstitial lung disease

## 2014-02-02 NOTE — Telephone Encounter (Signed)
[facility]  Nothing further needed.

## 2014-02-04 ENCOUNTER — Encounter: Payer: Self-pay | Admitting: Internal Medicine

## 2014-02-04 NOTE — Progress Notes (Signed)
Subjective:    Patient ID: Kristin [name], female    DOB: 06/23/1950, 63 y.o.   MRN: [id number]  HPI 63 year old female with past history of hypertension, hypercholesterolemia and degenerative joint disease who comes in today as a work in to discuss a recent visit with Dr Kendrick FriesMcQuaid.  Was previously treated for pneumonia.  See previous notes for details.  Saw Dr Kendrick FriesMcQuaid.  See his note for details.  Still sob with exertion. Recently treated for respiratory infection.  Better after prednisone.  Had CT prior to getting sick (and prior to prednisone) that revealed some progressive changes.  He discussed with her possible etiologies and further w/up.  She is contemplating whether or not to proceed with the biopsy or try a 6 week course of prednisone.  We discussed this today.  She is has discussed with her family and is leaning towards the biopsy stating "she wants to know for sure".  Does feel some better after the abx and steroids.  Still some residual cough.   Is back using CPAP.  Also had issues with bradycardia.  Saw cardiology.  See Darvin Neighboursonna Carroll's note and Dr Philemon KingdomKowalski's note for details.  She is now on diltiazem twice a day.  She reports since the treatment of the respiratory infection, her heart has been doing better.  No recent episodes she has noticed of afib.  No nausea or vomiting.  Bowels stable.        Past Medical History  Diagnosis Date  . Hypertension   . Hypercholesterolemia   . Fibrocystic breast disease   . Degenerative joint disease   . Atrial fibrillation     s/p knee surgery.  maintained in SR since  . GERD (gastroesophageal reflux disease)     Current Outpatient Prescriptions on File Prior to Visit  Medication Sig Dispense Refill  . amoxicillin-clavulanate (AUGMENTIN) 875-125 MG per tablet Take 1 tablet by mouth 2 (two) times daily. 20 tablet 0  . aspirin 325 MG EC tablet Take 325 mg by mouth daily.    . cetirizine (ZYRTEC) 10 MG tablet Take 10 mg by mouth daily.    .  cholecalciferol (VITAMIN D) 1000 UNITS tablet Take 1,000 Units by mouth daily.    . CRESTOR 10 MG tablet TAKE 1 TABLET EVERY DAY 30 tablet 5  . diltiazem (CARDIZEM) 30 MG tablet Take 15 mg by mouth 2 (two) times daily.    . flecainide (TAMBOCOR) 150 MG tablet Take 150 mg by mouth 2 (two) times daily.    . fluticasone (FLONASE) 50 MCG/ACT nasal spray Place 2 sprays into the nose daily as needed.    . Fluticasone-Salmeterol (ADVAIR DISKUS) 250-50 MCG/DOSE AEPB Inhale 1 puff into the lungs 2 (two) times daily. 60 each 5  . meloxicam (MOBIC) 7.5 MG tablet TAKE ONE TABLET DAILY WITH FOOD 30 tablet 3  . montelukast (SINGULAIR) 10 MG tablet TAKE ONE TABLET AT BEDTIME 30 tablet 3  . omeprazole (PRILOSEC OTC) 20 MG tablet Take 20 mg by mouth daily.    Marland Kitchen. Respiratory Therapy Supplies (FLUTTER) DEVI daily.    . sertraline (ZOLOFT) 50 MG tablet TAKE ONE AND ONE-HALF TABLETS DAILY 45 tablet 3  . Spacer/Aero-Holding Chambers (AEROCHAMBER MV) inhaler Use as instructed 1 each 0  . triamterene-hydrochlorothiazide (MAXZIDE-25) 37.5-25 MG per tablet Take 1 tablet by mouth daily. 30 tablet 5   No current facility-administered medications on file prior to visit.    Review of Systems Patient denies any headache or dizziness.  No significant sinus issues.   No chest pain, tightness or palpitations currently. Breathing as outlined.  Still sob with exertion.  No nausea or vomiting.  No acid reflux.  No abdominal pain or cramping.  No bowel change, such as diarrhea, constipation, BRBPR or melana.  No urine change.  Feels she is handling stress relatively well.  Feels the zoloft is working well.  Using CPAP regularly now.               Objective:   Physical Exam  Filed Vitals:   01/31/14 0805  BP: 110/60  Pulse: 61  Temp: 98.1 F (3736.697 C)   63 year old female in no acute distress.   HEENT:  Nares- clear.  Oropharynx - without lesions. NECK:  Supple.  Nontender.  No audible bruit.  HEART:  Appears to be  regular. LUNGS:  No crackles or wheezing audible.  Respirations even and unlabored.  Good breath sounds bilaterally.  RADIAL PULSE:  Equal bilaterally.        Assessment & Plan:  HEMATOLOGY.  Previous H&H increased.  Saw Dr Orlie DakinFinnegan.  Recommended no further w/up unless hgb remaining above 18. Hgb has improved with the use of her CPAP.  Follow.    INCREASED PSYCHOSOCIAL STRESSORS.  Feels she is handling things relatively well.  Follow.  Now on Zoloft  50mg  1 1/2 tablet q day.   Osteopenia States was told on previous bone density had osteopenia.  - Vit D  25 hydroxy (rtn osteoporosis monitoring)  Paroxysmal atrial fibrillation Better since treating her underlying lung infection.  Continue f/u with cardiology.  - TSH  Gastroesophageal reflux disease, esophagitis presence not specified Controlled.   Hypercholesterolemia Low cholesterol diet.  On crestor.  Follow lipid panel and liver function.  Lab Results  Component Value Date   CHOL 181 01/31/2014   HDL 64.30 01/31/2014   LDLCALC 104* 01/31/2014   TRIG 65.0 01/31/2014   CHOLHDL 3 01/31/2014   Essential hypertension Blood pressure doing well.  Follow.    Obstructive sleep apnea Using CPAP.   ILD (interstitial lung disease) See Dr Ulyses JarredMcQuaid's note.  Increased changes on CT scan.  She is contemplating prednisone course vs biopsy.  See above.  Plans to discuss more with Dr Kendrick FriesMcQuaid.    HEALTH MAINTENANCE.  Physical 09/13/12.  Pap ok.  Scheduled for a f/u physical.  GI as above.  Mammogram 02/17/11 - BiRADS II.  Mammogram 03/28/12 recommended follow up views.  Follow up views 04/05/12 - Birads II.  Mammogram 01/04/14 - Birads I.

## 2014-02-05 ENCOUNTER — Other Ambulatory Visit: Payer: Self-pay | Admitting: *Deleted

## 2014-02-05 ENCOUNTER — Institutional Professional Consult (permissible substitution) (INDEPENDENT_AMBULATORY_CARE_PROVIDER_SITE_OTHER): Payer: BC Managed Care – PPO | Admitting: Thoracic Surgery (Cardiothoracic Vascular Surgery)

## 2014-02-05 ENCOUNTER — Telehealth: Payer: Self-pay | Admitting: *Deleted

## 2014-02-05 ENCOUNTER — Encounter: Payer: Self-pay | Admitting: Thoracic Surgery (Cardiothoracic Vascular Surgery)

## 2014-02-05 VITALS — BP 160/76 | HR 80 | Resp 20 | Ht 65.5 in | Wt 340.0 lb

## 2014-02-05 DIAGNOSIS — J849 Interstitial pulmonary disease, unspecified: Secondary | ICD-10-CM

## 2014-02-05 DIAGNOSIS — Z6841 Body Mass Index (BMI) 40.0 and over, adult: Secondary | ICD-10-CM

## 2014-02-05 NOTE — Telephone Encounter (Signed)
Her diltiazem was prescribed and is being followed by cardiology.  Will need to contact them to confirm dose.

## 2014-02-05 NOTE — Telephone Encounter (Signed)
Pt wants to be sure that she is taking the correct amount of Dilitiazem? Pt states that she is seeing the Thoracic surgeon today & was looking over her meds. She is taking 120mg  tablet (1/2 BID). Please advise.

## 2014-02-05 NOTE — Progress Notes (Signed)
PCP is Charm Barges, MD Referring Provider is Lupita Leash, MD  Chief Complaint  Patient presents with  . Interstitial Lung Disease    Surgical eval, Chest CT 01/16/2014    HPI: 63 year old woman sent for consultation for a lung biopsy.  Kristin [name] is a 63 year old woman who presents with a chief complaint of chronic cough and shortness of breath with minimal exertion.  She is a 63 year old woman with a history of atrial fibrillation, reflux, obstructive sleep apnea, and morbid obesity. She had no pre-existing lung disease prior to February 2015. In February 2015 she had the flu which progressed to a community-acquired pneumonia. She was treated with antibiotics. Her fever improved, but she continued to have cough and shortness of breath with exertion. This cough was primarily dry, although she has had some productive cough about 3 weeks ago. She has not had any hemoptysis.  She went back to Dr. Lorin Picket because of her ongoing symptoms. A chest x-ray showed likely fibrosis in comparison to the infiltrate which is been seen in February. A CT of the chest showed evidence of pulmonary fibrosis and bronchiectasis primarily affecting the left lung.  She was referred to Dr. Kendrick Fries. He felt the changes were consistent with interstitial lung disease. He treated her with Advair and Provera, as well as a flutter valve.  About 3-4 weeks ago she had a increasingly productive cough and runny nose. She was treated with Augmentin and prednisone. She did improve with that to some degree. The productive cough stopped, but she continues to have a chronic dry cough.or shortness of breath is unchanged. She gets short of breath with minimal walking on level ground or just a few steps.  She does have obstructive sleep apnea and uses CPAP at night.  07/2013 CT chest> left greater than right interstitial changes throughout the left upper lobe as well as in the left base, question focal areas of  bronchiectasis in the lingula, basilar very mild interstitial changes in a peripheral distribution in the right lung overall findings are nonspecific 08/2013 PFT> ratio 74%, FEV1 1.66L (74% pred, 1% change), TLC 2.98L (59% pred), ERV 0.03 (3% pred), DLCO 14.2 (48% pred) 12/2013 CBC with diff > 6.6% relative eos, absolute 600/uL 12/2013 PFT> Ratio 75%, FEV1 1.47L (65% pred, 1% change), TLC 2.77L (55% pred), DLCO 11.7 (38% pred) 12/2013 CT chest > progressive ILD changes compared to previous study. New Right upper lobe groundglass opacity.     Past Medical History  Diagnosis Date  . Hypertension   . Hypercholesterolemia   . Fibrocystic breast disease   . Degenerative joint disease   . Atrial fibrillation     s/p knee surgery.  maintained in SR since  . GERD (gastroesophageal reflux disease)     Past Surgical History  Procedure Laterality Date  . Tonsillectomy    . Cholecystectomy  2003  . Replacement total knee  2004    Dr Erin Sons  . Replacement total knee  12/08    left  . 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 Heart disease Father     myocardial infarction  . Diabetes Father   . Pancreatitis Mother     gallstone  . Colon cancer Neg Hx     Social History History  Substance Use Topics  . Smoking status: Former Smoker -- 1.00 packs/day for 15 years    Types: Cigarettes    Quit date: 03/31/1983  . Smokeless  tobacco: Never Used  . Alcohol Use: 0.0 oz/week    0 Not specified per week     Comment: occasional    Current Outpatient Prescriptions  Medication Sig Dispense Refill  . aspirin 325 MG EC tablet Take 325 mg by mouth daily.    . cetirizine (ZYRTEC) 10 MG tablet Take 10 mg by mouth daily.    . Cholecalciferol (VITAMIN D) 2000 UNITS tablet Take 2,000 Units by mouth daily.    . CRESTOR 10 MG tablet TAKE 1 TABLET EVERY DAY 30 tablet 5  . diltiazem (CARDIZEM) 120 MG tablet Take 60 mg by mouth 2 (two) times  daily.    . flecainide (TAMBOCOR) 150 MG tablet Take 150 mg by mouth 2 (two) times daily.    . fluticasone (FLONASE) 50 MCG/ACT nasal spray Place 2 sprays into the nose daily as needed.    . Fluticasone-Salmeterol (ADVAIR DISKUS) 250-50 MCG/DOSE AEPB Inhale 1 puff into the lungs 2 (two) times daily. 60 each 5  . meloxicam (MOBIC) 7.5 MG tablet TAKE ONE TABLET DAILY WITH FOOD 30 tablet 3  . montelukast (SINGULAIR) 10 MG tablet TAKE ONE TABLET AT BEDTIME 30 tablet 3  . omeprazole (PRILOSEC OTC) 20 MG tablet Take 20 mg by mouth daily.    Marland Kitchen. Respiratory Therapy Supplies (FLUTTER) DEVI daily.    . sertraline (ZOLOFT) 50 MG tablet TAKE ONE AND ONE-HALF TABLETS DAILY 45 tablet 3  . Spacer/Aero-Holding Chambers (AEROCHAMBER MV) inhaler Use as instructed 1 each 0  . triamterene-hydrochlorothiazide (MAXZIDE-25) 37.5-25 MG per tablet Take 1 tablet by mouth daily. (Patient taking differently: Take 0.5 tablets by mouth daily. ) 30 tablet 5   No current facility-administered medications for this visit.    Allergies  Allergen Reactions  . Iodine   . Lisinopril Cough  . Tetanus Toxoids     Hives, welts    Review of Systems  Constitutional: Positive for activity change, appetite change, fatigue and unexpected weight change (gained 10 pounds in 3 months).  HENT: Positive for congestion.   Respiratory: Positive for apnea (CPAP at night), cough and shortness of breath (with minimal exertion). Negative for wheezing.   Cardiovascular: Negative for chest pain.       Atrial fibrillation, varicose veins  Gastrointestinal:       Reflux  Genitourinary: Positive for frequency.  Musculoskeletal: Positive for arthralgias (hx B TKR).  Hematological: Bruises/bleeds easily.  Psychiatric/Behavioral: The patient is nervous/anxious.   All other systems reviewed and are negative.   BP 160/76 mmHg  Pulse 80  Resp 20  Ht 5' 5.5" (1.664 m)  Wt 340 lb (154.223 kg)  BMI 55.70 kg/m2  SpO2 92% Physical Exam   Constitutional: She is oriented to person, place, and time. No distress.  Morbidly obese  HENT:  Head: Normocephalic and atraumatic.  Eyes: EOM are normal. Pupils are equal, round, and reactive to light.  Neck: Neck supple. No thyromegaly present.  Cardiovascular: Normal rate, regular rhythm, normal heart sounds and intact distal pulses.  Exam reveals no gallop.   No murmur heard. Pulmonary/Chest: Effort normal.  Inspiratory crackles on left, no wheezing  Abdominal: Soft. There is no tenderness.  Musculoskeletal: She exhibits no edema.  Lymphadenopathy:    She has no cervical adenopathy.  Neurological: She is alert and oriented to person, place, and time. No cranial nerve deficit.  Skin: Skin is warm and dry.  Psychiatric: She has a normal mood and affect.  Vitals reviewed.    Diagnostic Tests: CT and PFTs  reviewed  Impression: 63 year old woman with an 8 month history of cough and shortness of breath who has progressive interstitial disease/fibrosis on CT scan. The differential diagnosis includes ILD, hypersensitivity pneumonitis, BOOP.  I reviewed the CT scan with Kristin [name] and her family, including her daughter who is an ICU nurse in [employer]. We discussed the differential diagnosis. We discussed the option of a trial of prednisone. She feels strongly that she does not want to be on prednisone. We discussed the option of a thoracoscopic lung biopsy for diagnostic purposes.  I discussed thoracoscopic lung biopsy with Kristin [name] and her family. We reviewed the indications, risks, benefits, and alternatives. They understand that this is a diagnostic and not a therapeutic procedure. I reviewed the general nature of the operation, including the need for general anesthesia, the incisions to be used, the use of a chest tube postoperatively, the expected hospital stay, and overall recovery. They understand that the risks include, but are not limited to death, MI, DVT, PE,  bleeding, possible need for transfusion, infection, prolonged air leak, cardiac arrhythmias, as well as the possibility of other unforeseeable complications.  She accepts the risks and wishes to proceed  Plan: Left VATS, lung biopsy on Wednesday, 02/14/2014

## 2014-02-05 NOTE — Progress Notes (Signed)
Thanks

## 2014-02-05 NOTE — Telephone Encounter (Signed)
Pt notified and verbalized understanding.

## 2014-02-06 ENCOUNTER — Telehealth: Payer: Self-pay

## 2014-02-06 ENCOUNTER — Encounter: Payer: BC Managed Care – PPO | Admitting: Internal Medicine

## 2014-02-06 NOTE — Telephone Encounter (Signed)
Spoke with pt, she is aware of results.  Nothing further needed.  

## 2014-02-06 NOTE — Telephone Encounter (Signed)
lmtcb X1 to relay blood gas results.

## 2014-02-06 NOTE — Telephone Encounter (Signed)
-----   Message from Lupita Leashouglas B McQuaid, MD sent at 02/06/2014  8:06 AM EST ----- A, Please let her know that her RA ABG was normal Thanks B

## 2014-02-06 NOTE — Telephone Encounter (Signed)
Pt returned call & can be reached at [phone].  Kristin [name]

## 2014-02-10 NOTE — Pre-Procedure Instructions (Signed)
Kristin [name]  02/10/2014   Your procedure is scheduled on:  November 18  [facility]:00 AM.  Call this number if you have problems the morning of surgery: [phone]   Remember:   Do not eat food or drink liquids after midnight.   Take these medicines the morning of surgery with A SIP OF WATER: Zyrtec, Diltiazem, Flecainide, Advair, Omeprazole, Zoloft   STOP Aspirin, Vitamin D, Meloxicam today   STOP/ Do not take Aspirin, Aleve, Naproxen, Advil, Ibuprofen, Motrin, Vitamins, Herbs, or Supplements starting today   Do not wear jewelry, make-up or nail polish.  Do not wear lotions, powders, or perfumes. You may wear deodorant.  Do not shave 48 hours prior to surgery. Men may shave face and neck.  Do not bring valuables to the hospital.  [facility] is not responsible for any belongings or valuables.               Contacts, dentures or bridgework may not be worn into surgery.  Leave suitcase in the car. After surgery it may be brought to your room.  For patients admitted to the hospital, discharge time is determined by your treatment team.               Special Instructions: [facility] - Preparing for Surgery  Before surgery, you can play an important role.  Because skin is not sterile, your skin needs to be as free of germs as possible.  You can reduce the number of germs on you skin by washing with CHG (chlorahexidine gluconate) soap before surgery.  CHG is an antiseptic cleaner which kills germs and bonds with the skin to continue killing germs even after washing.  Please DO NOT use if you have an allergy to CHG or antibacterial soaps.  If your skin becomes reddened/irritated stop using the CHG and inform your nurse when you arrive at Short Stay.  Do not shave (including legs and underarms) for at least 48 hours prior to the first CHG shower.  You may shave your face.  Please follow these instructions carefully:   1.  Shower with CHG Soap  the night before surgery and the morning of Surgery.  2.  If you choose to wash your hair, wash your hair first as usual with your normal shampoo.  3.  After you shampoo, rinse your hair and body thoroughly to remove the shampoo.  4.  Use CHG as you would any other liquid soap.  You can apply CHG directly to the skin and wash gently with scrungie or a clean washcloth.  5.  Apply the CHG Soap to your body ONLY FROM THE NECK DOWN.  Do not use on open wounds or open sores.  Avoid contact with your eyes, ears, mouth and genitals (private parts).  Wash genitals (private parts) with your normal soap.  6.  Wash thoroughly, paying special attention to the area where your surgery will be performed.  7.  Thoroughly rinse your body with warm water from the neck down.  8.  DO NOT shower/wash with your normal soap after using and rinsing off the CHG Soap.  9.  Pat yourself dry with a clean towel.            10.  Wear clean pajamas.            11.  Place clean sheets on your bed the night of your first shower and do not sleep with pets.  Day of Surgery  Do not apply any lotions the morning of surgery.  Please wear clean clothes to the hospital/surgery center.     Please read over the following fact sheets that you were given: Pain Booklet, Coughing and Deep Breathing, Blood Transfusion Information and Surgical Site Infection Prevention

## 2014-02-12 ENCOUNTER — Encounter (HOSPITAL_COMMUNITY)
Admission: RE | Admit: 2014-02-12 | Discharge: 2014-02-12 | Disposition: A | Discharge status: Home / Self Care | Payer: BC Managed Care – PPO | Source: Ambulatory Visit | Attending: Thoracic Surgery (Cardiothoracic Vascular Surgery) | Admitting: Thoracic Surgery (Cardiothoracic Vascular Surgery)

## 2014-02-12 ENCOUNTER — Other Ambulatory Visit: Payer: Self-pay

## 2014-02-12 ENCOUNTER — Encounter (HOSPITAL_COMMUNITY): Payer: Self-pay

## 2014-02-12 VITALS — BP 151/76 | HR 80 | Temp 98.1°F | Resp 22 | Ht 65.5 in | Wt 329.8 lb

## 2014-02-12 DIAGNOSIS — Z01818 Encounter for other preprocedural examination: Secondary | ICD-10-CM | POA: Insufficient documentation

## 2014-02-12 DIAGNOSIS — I517 Cardiomegaly: Secondary | ICD-10-CM

## 2014-02-12 DIAGNOSIS — Z87891 Personal history of nicotine dependence: Secondary | ICD-10-CM | POA: Insufficient documentation

## 2014-02-12 DIAGNOSIS — J8410 Pulmonary fibrosis, unspecified: Secondary | ICD-10-CM | POA: Insufficient documentation

## 2014-02-12 DIAGNOSIS — I340 Nonrheumatic mitral (valve) insufficiency: Secondary | ICD-10-CM | POA: Insufficient documentation

## 2014-02-12 DIAGNOSIS — Z96652 Presence of left artificial knee joint: Secondary | ICD-10-CM | POA: Insufficient documentation

## 2014-02-12 DIAGNOSIS — J849 Interstitial pulmonary disease, unspecified: Secondary | ICD-10-CM

## 2014-02-12 DIAGNOSIS — Z947 Corneal transplant status: Secondary | ICD-10-CM | POA: Insufficient documentation

## 2014-02-12 DIAGNOSIS — G4733 Obstructive sleep apnea (adult) (pediatric): Secondary | ICD-10-CM

## 2014-02-12 DIAGNOSIS — I071 Rheumatic tricuspid insufficiency: Secondary | ICD-10-CM | POA: Insufficient documentation

## 2014-02-12 DIAGNOSIS — I10 Essential (primary) hypertension: Secondary | ICD-10-CM

## 2014-02-12 DIAGNOSIS — E780 Pure hypercholesterolemia: Secondary | ICD-10-CM

## 2014-02-12 DIAGNOSIS — K219 Gastro-esophageal reflux disease without esophagitis: Secondary | ICD-10-CM | POA: Insufficient documentation

## 2014-02-12 DIAGNOSIS — R0602 Shortness of breath: Secondary | ICD-10-CM

## 2014-02-12 DIAGNOSIS — J479 Bronchiectasis, uncomplicated: Secondary | ICD-10-CM

## 2014-02-12 HISTORY — DX: Reserved for inherently not codable concepts without codable children: IMO0001

## 2014-02-12 HISTORY — DX: Other allergic rhinitis: J30.89

## 2014-02-12 HISTORY — DX: Sleep apnea, unspecified: G47.30

## 2014-02-12 LAB — CBC
HCT: 50.2 % — ABNORMAL HIGH (ref 36.0–46.0)
HEMOGLOBIN: 16.3 g/dL — AB (ref 12.0–15.0)
MCH: 28.4 pg (ref 26.0–34.0)
MCHC: 32.5 g/dL (ref 30.0–36.0)
MCV: 87.5 fL (ref 78.0–100.0)
Platelets: 232 10*3/uL (ref 150–400)
RBC: 5.74 MIL/uL — ABNORMAL HIGH (ref 3.87–5.11)
RDW: 13.1 % (ref 11.5–15.5)
WBC: 9.9 10*3/uL (ref 4.0–10.5)

## 2014-02-12 LAB — BLOOD GAS, ARTERIAL
Acid-Base Excess: 2.9 mmol/L — ABNORMAL HIGH (ref 0.0–2.0)
Bicarbonate: 26.7 mEq/L — ABNORMAL HIGH (ref 20.0–24.0)
Drawn by: 206361
FIO2: 0.21 %
O2 Saturation: 90.1 %
PATIENT TEMPERATURE: 98.6
PO2 ART: 56.9 mmHg — AB (ref 80.0–100.0)
TCO2: 28 mmol/L (ref 0–100)
pCO2 arterial: 40.1 mmHg (ref 35.0–45.0)
pH, Arterial: 7.439 (ref 7.350–7.450)

## 2014-02-12 LAB — URINALYSIS, ROUTINE W REFLEX MICROSCOPIC
Bilirubin Urine: NEGATIVE
Glucose, UA: NEGATIVE mg/dL
Ketones, ur: NEGATIVE mg/dL
LEUKOCYTES UA: NEGATIVE
Nitrite: NEGATIVE
PROTEIN: NEGATIVE mg/dL
Specific Gravity, Urine: 1.011 (ref 1.005–1.030)
UROBILINOGEN UA: 1 mg/dL (ref 0.0–1.0)
pH: 6.5 (ref 5.0–8.0)

## 2014-02-12 LAB — COMPREHENSIVE METABOLIC PANEL
ALBUMIN: 3.3 g/dL — AB (ref 3.5–5.2)
ALK PHOS: 87 U/L (ref 39–117)
ALT: 16 U/L (ref 0–35)
AST: 24 U/L (ref 0–37)
Anion gap: 15 (ref 5–15)
BUN: 15 mg/dL (ref 6–23)
CO2: 23 mEq/L (ref 19–32)
Calcium: 9.5 mg/dL (ref 8.4–10.5)
Chloride: 97 mEq/L (ref 96–112)
Creatinine, Ser: 0.97 mg/dL (ref 0.50–1.10)
GFR calc Af Amer: 70 mL/min — ABNORMAL LOW (ref >90)
GFR calc non Af Amer: 60 mL/min — ABNORMAL LOW (ref >90)
GLUCOSE: 93 mg/dL (ref 70–99)
Potassium: 4.8 mEq/L (ref 3.7–5.3)
SODIUM: 135 meq/L — AB (ref 137–147)
Total Bilirubin: 0.6 mg/dL (ref 0.3–1.2)
Total Protein: 8.1 g/dL (ref 6.0–8.3)

## 2014-02-12 LAB — TYPE AND SCREEN
ABO/RH(D): A POS
ANTIBODY SCREEN: NEGATIVE

## 2014-02-12 LAB — PROTIME-INR
INR: 1.11 (ref 0.00–1.49)
Prothrombin Time: 14.4 seconds (ref 11.6–15.2)

## 2014-02-12 LAB — APTT: aPTT: 26 seconds (ref 24–37)

## 2014-02-12 LAB — SURGICAL PCR SCREEN
MRSA, PCR: NEGATIVE
Staphylococcus aureus: NEGATIVE

## 2014-02-12 LAB — ABO/RH: ABO/RH(D): A POS

## 2014-02-12 LAB — URINE MICROSCOPIC-ADD ON

## 2014-02-12 NOTE — Progress Notes (Signed)
Patient appeared to be short of breath after arriving to PAT and stated "man that's a long walk and I get short of breath easily because of the stuff I have going on with my lung."  O2 sat was 89% on room air and BP was noted to be elevated. PCP is Dale Durhamharlene Scott, Cardiologist is Arnoldo HookerBruce Kowalski and Pulmonologist is SCANA CorporationDouglas McQuaid. Patient informed Nurse that she recently had a stress test. Will request records. Patient also informed Nurse that she has a cough and recently after coughing she coughs up mucus that is brown, yellow, or clear. Patient had a office visit with Dr. Dorris FetchHendrickson on 02/05/14 and Nurse asked patient if she discussed it with him and she stated "no because it was not happening then....it's like a gag reflex or something. It happened today.Marland Kitchen.Marland Kitchen.Marland Kitchen.I drank some coffee and had some cereal and then after I coughed the mucus was brown. It looked like coffee but it was thicker than coffee, but I don't know where the cereal was." Husband at chair side during PAT visit. Vital signs will be rechecked before leaving PAT.

## 2014-02-12 NOTE — Progress Notes (Signed)
Nurse called and left a voicemail with Alycia RossettiRyan informing her of ABG's and that patient stated she had a productive cough.

## 2014-02-13 MED ORDER — DEXTROSE 5 % IV SOLN
1.5000 g | INTRAVENOUS | Status: AC
  Administered 2014-02-14: 1.5 g via INTRAVENOUS
  Filled 2014-02-13: qty 1.5

## 2014-02-13 NOTE — Progress Notes (Addendum)
Anesthesia Chart Review:  Patient is a 63 year old female scheduled for left VATS for lung biopsy on 02/14/14 by Dr. Dorris FetchHendrickson. She has had progressive SOB and recent CT showed progressive interstitial disease/fibrosis.  Biopsy was recommended for diagnostic purposes.    Other history includes former smoker, PAF (controlled with diltiazem and currently not on anticoagulation therapy), HTN, hypercholesterolemia, GERD, OSA with CPAP use, SOB, left TKA, corneal transplant, CAP 04/2013.  BMI is consistent with morbid obesity.  PCP is Dr. Dale Durhamharlene Scott.  Pulmonologist is Dr. Max Fickleouglas McQuaid. Cardiologist is Dr. Arnoldo HookerBruce Kowalski, last visit 10/16/13 (see Care Everywhere; older notes also scanned under Media tab).    PAT Vitals and meds noted.  EKG on 02/12/14: NSR, LAFB.   Nuclear stress test 10/13/11 Gavin Potters([facility]): Normal Regadenoson ECG without evidence of ischemia or dysrhythmia. Normal left ventricular function with EF 65%. Normal myocardial perfusion images at rest and peak stress without evidence of ischemia.  Echo on 08/31/11 Gavin Potters([facility]): Normal LV systolic function, EF 50%. Mild mitral and tricuspid insufficiency.  05/2013 event monitor Gavin Potters([facility]) showed predominant rhythm of SB with mean HR of 52 bpm, isolated PACs, non-sustained atrial run of 5 beats, isolated PVC.   CXR on 02/12/14: Borderline enlargement of cardiac silhouette. Progressive interstitial lung disease bilaterally, greater in upper lobes and on LEFT. Soft tissue fullness at the AP window and LEFT hilum unable to exclude adenopathy ; this could be better assessed by CT chest with contrast.  According to Dr. Sunday CornHendrickson's note: -07/2013 CT chest> left greater than right interstitial changes throughout the left upper lobe as well as in the left base, question focal areas of bronchiectasis in the lingula, basilar very mild interstitial changes in a peripheral distribution in the right lung overall findings are nonspecific -08/2013 PFT> ratio  74%, FEV1 1.66L (74% pred, 1% change), TLC 2.98L (59% pred), ERV 0.03 (3% pred), DLCO 14.2 (48% pred) -12/2013 CBC with diff > 6.6% relative eos, absolute 600/uL -12/2013 PFT> Ratio 75%, FEV1 1.47L (65% pred, 1% change), TLC 2.77L (55% pred), DLCO 11.7 (38% pred) -12/2013 CT chest > progressive ILD changes compared to previous study. New Right upper lobe groundglass opacity.  Preoperative labs and ABG noted.  ABG called to TCTS by the PAT RN.    Patient is maintaining SR and was seen by her cardiologist within the past four months. She has had cardiac testing within the past three years.  It appears her pulmonologist is aware of surgery plans. Further evaluation by her surgeon and anesthesiologist on the day of surgery.  If no acute changes then I would anticipate that she could proceed as planned.  Velna Ochsllison Cornesha Radziewicz, PA-C [facility]/Anesthesiology Phone [phone] 02/13/2014 1:22 PM

## 2014-02-14 ENCOUNTER — Encounter (HOSPITAL_COMMUNITY)
Admission: RE | Disposition: A | Discharge status: Other Acute Inpatient Hospital | Payer: Self-pay | Source: Ambulatory Visit | Attending: Thoracic Surgery (Cardiothoracic Vascular Surgery)

## 2014-02-14 ENCOUNTER — Inpatient Hospital Stay (HOSPITAL_COMMUNITY): Payer: BC Managed Care – PPO | Admitting: Critical Care Medicine

## 2014-02-14 ENCOUNTER — Encounter (HOSPITAL_COMMUNITY): Payer: Self-pay | Admitting: Critical Care Medicine

## 2014-02-14 ENCOUNTER — Inpatient Hospital Stay (HOSPITAL_COMMUNITY): Payer: BC Managed Care – PPO | Admitting: Vascular Surgery

## 2014-02-14 ENCOUNTER — Inpatient Hospital Stay (HOSPITAL_COMMUNITY): Payer: BC Managed Care – PPO

## 2014-02-14 ENCOUNTER — Inpatient Hospital Stay (HOSPITAL_COMMUNITY)
Admission: RE | Admit: 2014-02-14 | Discharge: 2014-03-01 | DRG: 166 | Disposition: A | Discharge status: Other Acute Inpatient Hospital | Payer: BC Managed Care – PPO | Source: Ambulatory Visit | Attending: Thoracic Surgery (Cardiothoracic Vascular Surgery) | Admitting: Thoracic Surgery (Cardiothoracic Vascular Surgery)

## 2014-02-14 DIAGNOSIS — J9602 Acute respiratory failure with hypercapnia: Secondary | ICD-10-CM | POA: Diagnosis not present

## 2014-02-14 DIAGNOSIS — J84112 Idiopathic pulmonary fibrosis: Secondary | ICD-10-CM | POA: Diagnosis present

## 2014-02-14 DIAGNOSIS — Z887 Allergy status to serum and vaccine status: Secondary | ICD-10-CM

## 2014-02-14 DIAGNOSIS — E876 Hypokalemia: Secondary | ICD-10-CM | POA: Diagnosis not present

## 2014-02-14 DIAGNOSIS — E780 Pure hypercholesterolemia: Secondary | ICD-10-CM | POA: Diagnosis present

## 2014-02-14 DIAGNOSIS — G4733 Obstructive sleep apnea (adult) (pediatric): Secondary | ICD-10-CM | POA: Diagnosis present

## 2014-02-14 DIAGNOSIS — J9601 Acute respiratory failure with hypoxia: Secondary | ICD-10-CM | POA: Diagnosis not present

## 2014-02-14 DIAGNOSIS — T380X5A Adverse effect of glucocorticoids and synthetic analogues, initial encounter: Secondary | ICD-10-CM | POA: Diagnosis not present

## 2014-02-14 DIAGNOSIS — J95811 Postprocedural pneumothorax: Secondary | ICD-10-CM | POA: Diagnosis not present

## 2014-02-14 DIAGNOSIS — J302 Other seasonal allergic rhinitis: Secondary | ICD-10-CM | POA: Diagnosis present

## 2014-02-14 DIAGNOSIS — I5031 Acute diastolic (congestive) heart failure: Secondary | ICD-10-CM | POA: Diagnosis not present

## 2014-02-14 DIAGNOSIS — J849 Interstitial pulmonary disease, unspecified: Secondary | ICD-10-CM

## 2014-02-14 DIAGNOSIS — Z6841 Body Mass Index (BMI) 40.0 and over, adult: Secondary | ICD-10-CM

## 2014-02-14 DIAGNOSIS — J8489 Other specified interstitial pulmonary diseases: Secondary | ICD-10-CM | POA: Diagnosis present

## 2014-02-14 DIAGNOSIS — I10 Essential (primary) hypertension: Secondary | ICD-10-CM | POA: Diagnosis present

## 2014-02-14 DIAGNOSIS — M1990 Unspecified osteoarthritis, unspecified site: Secondary | ICD-10-CM | POA: Diagnosis present

## 2014-02-14 DIAGNOSIS — E875 Hyperkalemia: Secondary | ICD-10-CM | POA: Diagnosis not present

## 2014-02-14 DIAGNOSIS — J9383 Other pneumothorax: Secondary | ICD-10-CM | POA: Diagnosis not present

## 2014-02-14 DIAGNOSIS — D72829 Elevated white blood cell count, unspecified: Secondary | ICD-10-CM | POA: Diagnosis not present

## 2014-02-14 DIAGNOSIS — T50905A Adverse effect of unspecified drugs, medicaments and biological substances, initial encounter: Secondary | ICD-10-CM

## 2014-02-14 DIAGNOSIS — Z91048 Other nonmedicinal substance allergy status: Secondary | ICD-10-CM

## 2014-02-14 DIAGNOSIS — Z888 Allergy status to other drugs, medicaments and biological substances status: Secondary | ICD-10-CM | POA: Diagnosis not present

## 2014-02-14 DIAGNOSIS — T859XXA Unspecified complication of internal prosthetic device, implant and graft, initial encounter: Secondary | ICD-10-CM

## 2014-02-14 DIAGNOSIS — Z7982 Long term (current) use of aspirin: Secondary | ICD-10-CM | POA: Diagnosis not present

## 2014-02-14 DIAGNOSIS — J9382 Other air leak: Secondary | ICD-10-CM | POA: Diagnosis not present

## 2014-02-14 DIAGNOSIS — R001 Bradycardia, unspecified: Secondary | ICD-10-CM | POA: Insufficient documentation

## 2014-02-14 DIAGNOSIS — I480 Paroxysmal atrial fibrillation: Secondary | ICD-10-CM | POA: Diagnosis present

## 2014-02-14 DIAGNOSIS — Z87891 Personal history of nicotine dependence: Secondary | ICD-10-CM

## 2014-02-14 DIAGNOSIS — Z79899 Other long term (current) drug therapy: Secondary | ICD-10-CM

## 2014-02-14 DIAGNOSIS — K219 Gastro-esophageal reflux disease without esophagitis: Secondary | ICD-10-CM | POA: Diagnosis present

## 2014-02-14 DIAGNOSIS — I4891 Unspecified atrial fibrillation: Secondary | ICD-10-CM | POA: Diagnosis present

## 2014-02-14 DIAGNOSIS — J9691 Respiratory failure, unspecified with hypoxia: Secondary | ICD-10-CM | POA: Insufficient documentation

## 2014-02-14 DIAGNOSIS — R059 Cough, unspecified: Secondary | ICD-10-CM | POA: Diagnosis present

## 2014-02-14 DIAGNOSIS — I9789 Other postprocedural complications and disorders of the circulatory system, not elsewhere classified: Secondary | ICD-10-CM | POA: Diagnosis not present

## 2014-02-14 DIAGNOSIS — N6019 Diffuse cystic mastopathy of unspecified breast: Secondary | ICD-10-CM | POA: Diagnosis present

## 2014-02-14 DIAGNOSIS — R739 Hyperglycemia, unspecified: Secondary | ICD-10-CM | POA: Diagnosis not present

## 2014-02-14 DIAGNOSIS — J8410 Pulmonary fibrosis, unspecified: Secondary | ICD-10-CM

## 2014-02-14 DIAGNOSIS — F22 Delusional disorders: Secondary | ICD-10-CM

## 2014-02-14 DIAGNOSIS — J939 Pneumothorax, unspecified: Secondary | ICD-10-CM | POA: Insufficient documentation

## 2014-02-14 DIAGNOSIS — N179 Acute kidney failure, unspecified: Secondary | ICD-10-CM | POA: Diagnosis not present

## 2014-02-14 DIAGNOSIS — Z9689 Presence of other specified functional implants: Secondary | ICD-10-CM

## 2014-02-14 DIAGNOSIS — R05 Cough: Secondary | ICD-10-CM | POA: Diagnosis present

## 2014-02-14 DIAGNOSIS — Z4682 Encounter for fitting and adjustment of non-vascular catheter: Secondary | ICD-10-CM

## 2014-02-14 HISTORY — PX: VIDEO ASSISTED THORACOSCOPY: SHX5073

## 2014-02-14 HISTORY — PX: LUNG BIOPSY: SHX5088

## 2014-02-14 LAB — POCT I-STAT 3, ART BLOOD GAS (G3+)
ACID-BASE EXCESS: 5 mmol/L — AB (ref 0.0–2.0)
Bicarbonate: 31 mEq/L — ABNORMAL HIGH (ref 20.0–24.0)
O2 Saturation: 96 %
PH ART: 7.389 (ref 7.350–7.450)
PO2 ART: 83 mmHg (ref 80.0–100.0)
TCO2: 33 mmol/L (ref 0–100)
pCO2 arterial: 51.3 mmHg — ABNORMAL HIGH (ref 35.0–45.0)

## 2014-02-14 SURGERY — VIDEO ASSISTED THORACOSCOPY
Anesthesia: General | Site: Chest | Laterality: Left

## 2014-02-14 MED ORDER — LIDOCAINE HCL (CARDIAC) 20 MG/ML IV SOLN
INTRAVENOUS | Status: AC
  Filled 2014-02-14: qty 5

## 2014-02-14 MED ORDER — NEOSTIGMINE METHYLSULFATE 10 MG/10ML IV SOLN
INTRAVENOUS | Status: AC
  Filled 2014-02-14: qty 1

## 2014-02-14 MED ORDER — NEOSTIGMINE METHYLSULFATE 10 MG/10ML IV SOLN
INTRAVENOUS | Status: DC | PRN
  Administered 2014-02-14: 5 mg via INTRAVENOUS

## 2014-02-14 MED ORDER — DEXTROSE-NACL 5-0.9 % IV SOLN
INTRAVENOUS | Status: DC
  Administered 2014-02-14: 18:00:00 via INTRAVENOUS

## 2014-02-14 MED ORDER — LORATADINE 10 MG PO TABS
10.0000 mg | ORAL_TABLET | Freq: Every day | ORAL | Status: DC
  Administered 2014-02-15 – 2014-03-01 (×15): 10 mg via ORAL
  Filled 2014-02-14 (×15): qty 1

## 2014-02-14 MED ORDER — SODIUM CHLORIDE 0.9 % IJ SOLN: 9.0000 mL | INTRAMUSCULAR | Status: DC | PRN

## 2014-02-14 MED ORDER — ROCURONIUM BROMIDE 50 MG/5ML IV SOLN
INTRAVENOUS | Status: AC
  Filled 2014-02-14: qty 1

## 2014-02-14 MED ORDER — SENNOSIDES-DOCUSATE SODIUM 8.6-50 MG PO TABS
1.0000 | ORAL_TABLET | Freq: Every day | ORAL | Status: DC
  Administered 2014-02-14 – 2014-02-28 (×15): 1 via ORAL
  Filled 2014-02-14 (×16): qty 1

## 2014-02-14 MED ORDER — LIDOCAINE HCL (CARDIAC) 20 MG/ML IV SOLN
INTRAVENOUS | Status: DC | PRN
  Administered 2014-02-14: 80 mg via INTRAVENOUS

## 2014-02-14 MED ORDER — DIPHENHYDRAMINE HCL 50 MG/ML IJ SOLN: 12.5000 mg | Freq: Four times a day (QID) | INTRAMUSCULAR | Status: DC | PRN

## 2014-02-14 MED ORDER — ROSUVASTATIN CALCIUM 10 MG PO TABS
10.0000 mg | ORAL_TABLET | Freq: Every day | ORAL | Status: DC
  Administered 2014-02-15 – 2014-03-01 (×15): 10 mg via ORAL
  Filled 2014-02-14 (×16): qty 1

## 2014-02-14 MED ORDER — LACTATED RINGERS IV SOLN
INTRAVENOUS | Status: DC | PRN
  Administered 2014-02-14: 13:00:00 via INTRAVENOUS

## 2014-02-14 MED ORDER — SERTRALINE HCL 50 MG PO TABS
75.0000 mg | ORAL_TABLET | Freq: Every day | ORAL | Status: DC
  Administered 2014-02-15 – 2014-03-01 (×15): 75 mg via ORAL
  Filled 2014-02-14 (×16): qty 1

## 2014-02-14 MED ORDER — FLUTICASONE PROPIONATE 50 MCG/ACT NA SUSP
2.0000 | Freq: Every day | NASAL | Status: DC | PRN
  Filled 2014-02-14: qty 16

## 2014-02-14 MED ORDER — TRIAMTERENE-HCTZ 37.5-25 MG PO TABS
0.5000 | ORAL_TABLET | Freq: Every day | ORAL | Status: DC
  Administered 2014-02-14 – 2014-03-01 (×16): 0.5 via ORAL
  Filled 2014-02-14 (×18): qty 0.5

## 2014-02-14 MED ORDER — TRAMADOL HCL 50 MG PO TABS
50.0000 mg | ORAL_TABLET | Freq: Four times a day (QID) | ORAL | Status: DC | PRN
  Administered 2014-02-16 – 2014-02-21 (×4): 100 mg via ORAL
  Filled 2014-02-14 (×5): qty 2

## 2014-02-14 MED ORDER — OMEPRAZOLE MAGNESIUM 20 MG PO TBEC: 20.0000 mg | DELAYED_RELEASE_TABLET | Freq: Every day | ORAL | Status: DC

## 2014-02-14 MED ORDER — SUCCINYLCHOLINE CHLORIDE 20 MG/ML IJ SOLN
INTRAMUSCULAR | Status: DC | PRN
  Administered 2014-02-14: 100 mg via INTRAVENOUS

## 2014-02-14 MED ORDER — LACTATED RINGERS IV SOLN: INTRAVENOUS | Status: DC

## 2014-02-14 MED ORDER — OXYCODONE HCL 5 MG PO TABS: 5.0000 mg | ORAL_TABLET | Freq: Once | ORAL | Status: DC | PRN

## 2014-02-14 MED ORDER — DIPHENHYDRAMINE HCL 12.5 MG/5ML PO ELIX
12.5000 mg | ORAL_SOLUTION | Freq: Four times a day (QID) | ORAL | Status: DC | PRN
  Filled 2014-02-14: qty 5

## 2014-02-14 MED ORDER — MOMETASONE FURO-FORMOTEROL FUM 100-5 MCG/ACT IN AERO
2.0000 | INHALATION_SPRAY | Freq: Two times a day (BID) | RESPIRATORY_TRACT | Status: DC
  Administered 2014-02-14 – 2014-02-16 (×4): 2 via RESPIRATORY_TRACT
  Filled 2014-02-14: qty 8.8

## 2014-02-14 MED ORDER — MIDAZOLAM HCL 2 MG/2ML IJ SOLN
INTRAMUSCULAR | Status: AC
  Filled 2014-02-14: qty 2

## 2014-02-14 MED ORDER — FLECAINIDE ACETATE 50 MG PO TABS
150.0000 mg | ORAL_TABLET | Freq: Two times a day (BID) | ORAL | Status: DC
  Administered 2014-02-14 – 2014-03-01 (×30): 150 mg via ORAL
  Filled 2014-02-14 (×32): qty 1

## 2014-02-14 MED ORDER — PREDNISOLONE ACETATE 1 % OP SUSP
1.0000 [drp] | Freq: Every day | OPHTHALMIC | Status: DC
  Administered 2014-02-14 – 2014-02-28 (×15): 1 [drp] via OPHTHALMIC
  Filled 2014-02-14: qty 1

## 2014-02-14 MED ORDER — ONDANSETRON HCL 4 MG/2ML IJ SOLN
INTRAMUSCULAR | Status: DC | PRN
  Administered 2014-02-14: 4 mg via INTRAVENOUS

## 2014-02-14 MED ORDER — OXYCODONE HCL 5 MG PO TABS
5.0000 mg | ORAL_TABLET | ORAL | Status: DC | PRN
  Administered 2014-02-14: 10 mg via ORAL
  Administered 2014-02-16: 5 mg via ORAL
  Administered 2014-02-17 – 2014-02-18 (×3): 10 mg via ORAL
  Administered 2014-02-21 – 2014-02-27 (×12): 5 mg via ORAL
  Administered 2014-02-27: 10 mg via ORAL
  Administered 2014-02-28 – 2014-03-01 (×3): 5 mg via ORAL
  Filled 2014-02-14: qty 1
  Filled 2014-02-14: qty 2
  Filled 2014-02-14: qty 1
  Filled 2014-02-14 (×2): qty 2
  Filled 2014-02-14 (×3): qty 1
  Filled 2014-02-14: qty 2
  Filled 2014-02-14 (×2): qty 1
  Filled 2014-02-14: qty 2
  Filled 2014-02-14 (×2): qty 1
  Filled 2014-02-14: qty 2
  Filled 2014-02-14 (×6): qty 1

## 2014-02-14 MED ORDER — GLYCOPYRROLATE 0.2 MG/ML IJ SOLN
INTRAMUSCULAR | Status: AC
  Filled 2014-02-14: qty 4

## 2014-02-14 MED ORDER — 0.9 % SODIUM CHLORIDE (POUR BTL) OPTIME
TOPICAL | Status: DC | PRN
  Administered 2014-02-14: 1000 mL

## 2014-02-14 MED ORDER — SODIUM CHLORIDE 0.9 % IV SOLN: INTRAVENOUS | Status: DC | PRN

## 2014-02-14 MED ORDER — DILTIAZEM HCL 60 MG PO TABS
60.0000 mg | ORAL_TABLET | Freq: Two times a day (BID) | ORAL | Status: DC
  Administered 2014-02-14 – 2014-02-18 (×5): 60 mg via ORAL
  Filled 2014-02-14 (×9): qty 1

## 2014-02-14 MED ORDER — ASPIRIN EC 325 MG PO TBEC
325.0000 mg | DELAYED_RELEASE_TABLET | Freq: Every day | ORAL | Status: DC
  Administered 2014-02-15 – 2014-03-01 (×15): 325 mg via ORAL
  Filled 2014-02-14 (×16): qty 1

## 2014-02-14 MED ORDER — PANTOPRAZOLE SODIUM 40 MG PO TBEC
40.0000 mg | DELAYED_RELEASE_TABLET | Freq: Every day | ORAL | Status: DC
  Administered 2014-02-15 – 2014-02-17 (×3): 40 mg via ORAL
  Filled 2014-02-14 (×4): qty 1

## 2014-02-14 MED ORDER — ONDANSETRON HCL 4 MG/2ML IJ SOLN: 4.0000 mg | Freq: Four times a day (QID) | INTRAMUSCULAR | Status: DC | PRN

## 2014-02-14 MED ORDER — GLYCOPYRROLATE 0.2 MG/ML IJ SOLN
INTRAMUSCULAR | Status: DC | PRN
  Administered 2014-02-14: .8 mg via INTRAVENOUS

## 2014-02-14 MED ORDER — PROPOFOL 10 MG/ML IV BOLUS
INTRAVENOUS | Status: AC
  Filled 2014-02-14: qty 20

## 2014-02-14 MED ORDER — HYDROMORPHONE HCL 1 MG/ML IJ SOLN
0.2500 mg | INTRAMUSCULAR | Status: DC | PRN
  Administered 2014-02-14 (×2): 0.5 mg via INTRAVENOUS

## 2014-02-14 MED ORDER — FENTANYL 10 MCG/ML IV SOLN
INTRAVENOUS | Status: DC
  Administered 2014-02-14: 10 ug via INTRAVENOUS
  Administered 2014-02-14: 17:00:00 via INTRAVENOUS
  Administered 2014-02-14: 60 ug via INTRAVENOUS
  Administered 2014-02-15: 70 ug via INTRAVENOUS
  Administered 2014-02-15: 90 ug via INTRAVENOUS
  Administered 2014-02-15: 60 ug via INTRAVENOUS
  Administered 2014-02-15 (×2): 50 ug via INTRAVENOUS
  Administered 2014-02-15: 22:00:00 via INTRAVENOUS
  Administered 2014-02-15: 50 ug via INTRAVENOUS
  Administered 2014-02-16: 20 ug via INTRAVENOUS
  Administered 2014-02-16: 0 ug via INTRAVENOUS
  Administered 2014-02-16: 40 ug via INTRAVENOUS
  Filled 2014-02-14 (×2): qty 50

## 2014-02-14 MED ORDER — PROPOFOL 10 MG/ML IV BOLUS
INTRAVENOUS | Status: DC | PRN
  Administered 2014-02-14: 40 mg via INTRAVENOUS
  Administered 2014-02-14: 160 mg via INTRAVENOUS
  Administered 2014-02-14: 10 mg via INTRAVENOUS

## 2014-02-14 MED ORDER — FENTANYL CITRATE 0.05 MG/ML IJ SOLN
INTRAMUSCULAR | Status: DC | PRN
  Administered 2014-02-14: 100 ug via INTRAVENOUS
  Administered 2014-02-14 (×2): 50 ug via INTRAVENOUS

## 2014-02-14 MED ORDER — MIDAZOLAM HCL 5 MG/5ML IJ SOLN
INTRAMUSCULAR | Status: DC | PRN
  Administered 2014-02-14: 2 mg via INTRAVENOUS

## 2014-02-14 MED ORDER — MONTELUKAST SODIUM 10 MG PO TABS
10.0000 mg | ORAL_TABLET | Freq: Every day | ORAL | Status: DC
  Administered 2014-02-14 – 2014-02-28 (×15): 10 mg via ORAL
  Filled 2014-02-14 (×16): qty 1

## 2014-02-14 MED ORDER — ACETAMINOPHEN 10 MG/ML IV SOLN
INTRAVENOUS | Status: DC | PRN
  Administered 2014-02-14: 1000 mg via INTRAVENOUS

## 2014-02-14 MED ORDER — OXYCODONE HCL 5 MG/5ML PO SOLN: 5.0000 mg | Freq: Once | ORAL | Status: DC | PRN

## 2014-02-14 MED ORDER — ACETAMINOPHEN 10 MG/ML IV SOLN
INTRAVENOUS | Status: AC
  Filled 2014-02-14: qty 100

## 2014-02-14 MED ORDER — ONDANSETRON HCL 4 MG/2ML IJ SOLN
INTRAMUSCULAR | Status: AC
  Filled 2014-02-14: qty 2

## 2014-02-14 MED ORDER — VECURONIUM BROMIDE 10 MG IV SOLR
INTRAVENOUS | Status: DC | PRN
  Administered 2014-02-14: 1 mg via INTRAVENOUS
  Administered 2014-02-14: 2 mg via INTRAVENOUS
  Administered 2014-02-14: 4 mg via INTRAVENOUS

## 2014-02-14 MED ORDER — FENTANYL CITRATE 0.05 MG/ML IJ SOLN
INTRAMUSCULAR | Status: AC
  Filled 2014-02-14: qty 5

## 2014-02-14 MED ORDER — POTASSIUM CHLORIDE 10 MEQ/50ML IV SOLN: 10.0000 meq | Freq: Every day | INTRAVENOUS | Status: DC | PRN

## 2014-02-14 MED ORDER — NALOXONE HCL 0.4 MG/ML IJ SOLN: 0.4000 mg | INTRAMUSCULAR | Status: DC | PRN

## 2014-02-14 MED ORDER — HYDROMORPHONE HCL 1 MG/ML IJ SOLN
INTRAMUSCULAR | Status: AC
  Filled 2014-02-14: qty 1

## 2014-02-14 MED ORDER — BISACODYL 5 MG PO TBEC
10.0000 mg | DELAYED_RELEASE_TABLET | Freq: Every day | ORAL | Status: DC
  Administered 2014-02-15 – 2014-03-01 (×15): 10 mg via ORAL
  Filled 2014-02-14 (×15): qty 2

## 2014-02-14 MED ORDER — ACETAMINOPHEN 500 MG PO TABS
1000.0000 mg | ORAL_TABLET | Freq: Four times a day (QID) | ORAL | Status: AC
  Administered 2014-02-14 – 2014-02-19 (×18): 1000 mg via ORAL
  Filled 2014-02-14 (×19): qty 2

## 2014-02-14 MED ORDER — ACETAMINOPHEN 160 MG/5ML PO SOLN
1000.0000 mg | Freq: Four times a day (QID) | ORAL | Status: AC
  Filled 2014-02-14: qty 40

## 2014-02-14 MED ORDER — LACTATED RINGERS IV SOLN
INTRAVENOUS | Status: DC
  Administered 2014-02-14: 10:00:00 via INTRAVENOUS

## 2014-02-14 SURGICAL SUPPLY — 73 items
APPLIER CLIP ROT 10 11.4 M/L (STAPLE)
BLADE SURG 10 STRL SS (BLADE) ×3 IMPLANT
CANISTER SUCTION 2500CC (MISCELLANEOUS) ×3 IMPLANT
CATH KIT ON Q 5IN SLV (PAIN MANAGEMENT) IMPLANT
CATH THORACIC 28FR (CATHETERS) ×3 IMPLANT
CATH THORACIC 28FR RT ANG (CATHETERS) IMPLANT
CATH THORACIC 36FR (CATHETERS) IMPLANT
CATH THORACIC 36FR RT ANG (CATHETERS) IMPLANT
CLIP APPLIE ROT 10 11.4 M/L (STAPLE) IMPLANT
CLIP TI MEDIUM 6 (CLIP) IMPLANT
CONN Y 3/8X3/8X3/8  BEN (MISCELLANEOUS) ×2
CONN Y 3/8X3/8X3/8 BEN (MISCELLANEOUS) ×1 IMPLANT
CONT SPEC 4OZ CLIKSEAL STRL BL (MISCELLANEOUS) ×12 IMPLANT
COVER SURGICAL LIGHT HANDLE (MISCELLANEOUS) ×3 IMPLANT
DERMABOND ADVANCED (GAUZE/BANDAGES/DRESSINGS) ×2
DERMABOND ADVANCED .7 DNX12 (GAUZE/BANDAGES/DRESSINGS) ×1 IMPLANT
DRAPE LAPAROSCOPIC ABDOMINAL (DRAPES) ×3 IMPLANT
DRAPE WARM FLUID 44X44 (DRAPE) ×3 IMPLANT
ELECT BLADE 6.5 EXT (BLADE) ×3 IMPLANT
ELECT REM PT RETURN 9FT ADLT (ELECTROSURGICAL) ×3
ELECTRODE REM PT RTRN 9FT ADLT (ELECTROSURGICAL) ×1 IMPLANT
GAUZE SPONGE 4X4 12PLY STRL (GAUZE/BANDAGES/DRESSINGS) ×3 IMPLANT
GLOVE SURG SIGNA 7.5 PF LTX (GLOVE) ×6 IMPLANT
GOWN STRL REUS W/ TWL LRG LVL3 (GOWN DISPOSABLE) ×2 IMPLANT
GOWN STRL REUS W/ TWL XL LVL3 (GOWN DISPOSABLE) ×1 IMPLANT
GOWN STRL REUS W/TWL LRG LVL3 (GOWN DISPOSABLE) ×4
GOWN STRL REUS W/TWL XL LVL3 (GOWN DISPOSABLE) ×2
HANDLE STAPLE ENDO GIA SHORT (STAPLE) ×2
HEMOSTAT SURGICEL 2X14 (HEMOSTASIS) IMPLANT
KIT BASIN OR (CUSTOM PROCEDURE TRAY) ×3 IMPLANT
KIT ROOM TURNOVER OR (KITS) ×3 IMPLANT
KIT SUCTION CATH 14FR (SUCTIONS) ×3 IMPLANT
NS IRRIG 1000ML POUR BTL (IV SOLUTION) ×6 IMPLANT
PACK CHEST (CUSTOM PROCEDURE TRAY) ×3 IMPLANT
PAD ARMBOARD 7.5X6 YLW CONV (MISCELLANEOUS) ×6 IMPLANT
POUCH ENDO CATCH II 15MM (MISCELLANEOUS) IMPLANT
POUCH SPECIMEN RETRIEVAL 10MM (ENDOMECHANICALS) IMPLANT
RELOAD EGIA 45 MED/THCK PURPLE (STAPLE) ×12 IMPLANT
RELOAD TRI 2.0 60 XTHK VAS SUL (STAPLE) ×3 IMPLANT
SEALANT PROGEL (MISCELLANEOUS) IMPLANT
SEALANT SURG COSEAL 4ML (VASCULAR PRODUCTS) IMPLANT
SEALANT SURG COSEAL 8ML (VASCULAR PRODUCTS) IMPLANT
SOLUTION ANTI FOG 6CC (MISCELLANEOUS) ×3 IMPLANT
SPECIMEN JAR MEDIUM (MISCELLANEOUS) ×3 IMPLANT
SPONGE GAUZE 4X4 12PLY STER LF (GAUZE/BANDAGES/DRESSINGS) ×3 IMPLANT
SPONGE INTESTINAL PEANUT (DISPOSABLE) IMPLANT
STAPLER ENDO GIA 12MM SHORT (STAPLE) ×1 IMPLANT
SUT PROLENE 4 0 RB 1 (SUTURE)
SUT PROLENE 4-0 RB1 .5 CRCL 36 (SUTURE) IMPLANT
SUT SILK  1 MH (SUTURE) ×4
SUT SILK 1 MH (SUTURE) ×2 IMPLANT
SUT SILK 2 0SH CR/8 30 (SUTURE) IMPLANT
SUT SILK 3 0SH CR/8 30 (SUTURE) IMPLANT
SUT VIC AB 1 CTX 36 (SUTURE) ×2
SUT VIC AB 1 CTX36XBRD ANBCTR (SUTURE) ×1 IMPLANT
SUT VIC AB 2-0 CTX 36 (SUTURE) ×3 IMPLANT
SUT VIC AB 2-0 UR6 27 (SUTURE) IMPLANT
SUT VIC AB 3-0 MH 27 (SUTURE) IMPLANT
SUT VIC AB 3-0 X1 27 (SUTURE) ×3 IMPLANT
SUT VICRYL 2 TP 1 (SUTURE) IMPLANT
SWAB COLLECTION DEVICE MRSA (MISCELLANEOUS) IMPLANT
SYSTEM SAHARA CHEST DRAIN ATS (WOUND CARE) ×3 IMPLANT
TAPE CLOTH 4X10 WHT NS (GAUZE/BANDAGES/DRESSINGS) ×3 IMPLANT
TAPE CLOTH SURG 4X10 WHT LF (GAUZE/BANDAGES/DRESSINGS) ×3 IMPLANT
TIP APPLICATOR SPRAY EXTEND 16 (VASCULAR PRODUCTS) IMPLANT
TOWEL OR 17X24 6PK STRL BLUE (TOWEL DISPOSABLE) ×3 IMPLANT
TOWEL OR 17X26 10 PK STRL BLUE (TOWEL DISPOSABLE) ×6 IMPLANT
TRAP SPECIMEN MUCOUS 40CC (MISCELLANEOUS) IMPLANT
TRAY FOLEY CATH 16FRSI W/METER (SET/KITS/TRAYS/PACK) ×3 IMPLANT
TROCAR XCEL NON-BLD 5MMX100MML (ENDOMECHANICALS) IMPLANT
TUBE ANAEROBIC SPECIMEN COL (MISCELLANEOUS) IMPLANT
TUNNELER SHEATH ON-Q 11GX8 DSP (PAIN MANAGEMENT) IMPLANT
WATER STERILE IRR 1000ML POUR (IV SOLUTION) ×6 IMPLANT

## 2014-02-14 NOTE — Anesthesia Preprocedure Evaluation (Signed)
Anesthesia Evaluation  Patient identified by MRN, date of birth, ID band Patient awake    Reviewed: Allergy & Precautions, H&P , NPO status , Patient's Chart, lab work & pertinent test results  Airway        Dental  (+) Dental Advisory Given   Pulmonary shortness of breath, sleep apnea and Continuous Positive Airway Pressure Ventilation , former smoker,          Cardiovascular hypertension, Pt. on medications + dysrhythmias Atrial Fibrillation     Neuro/Psych    GI/Hepatic GERD-  Medicated,  Endo/Other  Morbid obesity  Renal/GU      Musculoskeletal  (+) Arthritis -,   Abdominal   Peds  Hematology   Anesthesia Other Findings   Reproductive/Obstetrics                             Anesthesia Physical Anesthesia Plan  ASA: III  Anesthesia Plan: General   Post-op Pain Management:    Induction: Intravenous  Airway Management Planned: Double Lumen EBT  Additional Equipment: Arterial line  Intra-op Plan:   Post-operative Plan: Extubation in OR and Possible Post-op intubation/ventilation  Informed Consent: I have reviewed the patients History and Physical, chart, labs and discussed the procedure including the risks, benefits and alternatives for the proposed anesthesia with the patient or authorized representative who has indicated his/her understanding and acceptance.   Dental advisory given  Plan Discussed with: Anesthesiologist and Surgeon  Anesthesia Plan Comments:         Anesthesia Quick Evaluation

## 2014-02-14 NOTE — Interval H&P Note (Signed)
History and Physical Interval Note:  02/14/2014 1:10 PM  Kristin [name]  has presented today for surgery, with the diagnosis of ILD  The various methods of treatment have been discussed with the patient and family. After consideration of risks, benefits and other options for treatment, the patient has consented to  Procedure(s): VIDEO ASSISTED THORACOSCOPY (Left) LUNG BIOPSY (Left) as a surgical intervention .  The patient's history has been reviewed, patient examined, no change in status, stable for surgery.  I have reviewed the patient's chart and labs.  Questions were answered to the patient's satisfaction.     [name]

## 2014-02-14 NOTE — Anesthesia Postprocedure Evaluation (Signed)
  Anesthesia Post-op Note  Patient: Kristin [name]  Procedure(s) Performed: Procedure(s): VIDEO ASSISTED THORACOSCOPY (Left) LUNG BIOPSY (Left)  Patient Location: PACU  Anesthesia Type:General  Level of Consciousness: awake, alert , oriented and patient cooperative  Airway and Oxygen Therapy: Patient Spontanous Breathing and Patient connected to nasal cannula oxygen  Post-op Pain: mild  Post-op Assessment: Post-op Vital signs reviewed, Patient's Cardiovascular Status Stable, Respiratory Function Stable, Patent Airway, No signs of Nausea or vomiting and Pain level controlled  Post-op Vital Signs: Reviewed and stable  Last Vitals:  Filed Vitals:   02/14/14 1715  BP: 127/54  Pulse: 54  Temp: 36.3 C  Resp: 18    Complications: No apparent anesthesia complications

## 2014-02-14 NOTE — H&P (View-Only) (Signed)
PCP is Charm Barges, MD Referring Provider is Lupita Leash, MD  Chief Complaint  Patient presents with  . Interstitial Lung Disease    Surgical eval, Chest CT 01/16/2014    HPI: 63 year old woman sent for consultation for a lung biopsy.  Kristin [name] is a 63 year old woman who presents with a chief complaint of chronic cough and shortness of breath with minimal exertion.  She is a 63 year old woman with a history of atrial fibrillation, reflux, obstructive sleep apnea, and morbid obesity. She had no pre-existing lung disease prior to February 2015. In February 2015 she had the flu which progressed to a community-acquired pneumonia. She was treated with antibiotics. Her fever improved, but she continued to have cough and shortness of breath with exertion. This cough was primarily dry, although she has had some productive cough about 3 weeks ago. She has not had any hemoptysis.  She went back to Dr. Lorin Picket because of her ongoing symptoms. A chest x-ray showed likely fibrosis in comparison to the infiltrate which is been seen in February. A CT of the chest showed evidence of pulmonary fibrosis and bronchiectasis primarily affecting the left lung.  She was referred to Dr. Kendrick Fries. He felt the changes were consistent with interstitial lung disease. He treated her with Advair and Provera, as well as a flutter valve.  About 3-4 weeks ago she had a increasingly productive cough and runny nose. She was treated with Augmentin and prednisone. She did improve with that to some degree. The productive cough stopped, but she continues to have a chronic dry cough.or shortness of breath is unchanged. She gets short of breath with minimal walking on level ground or just a few steps.  She does have obstructive sleep apnea and uses CPAP at night.  07/2013 CT chest> left greater than right interstitial changes throughout the left upper lobe as well as in the left base, question focal areas of  bronchiectasis in the lingula, basilar very mild interstitial changes in a peripheral distribution in the right lung overall findings are nonspecific 08/2013 PFT> ratio 74%, FEV1 1.66L (74% pred, 1% change), TLC 2.98L (59% pred), ERV 0.03 (3% pred), DLCO 14.2 (48% pred) 12/2013 CBC with diff > 6.6% relative eos, absolute 600/uL 12/2013 PFT> Ratio 75%, FEV1 1.47L (65% pred, 1% change), TLC 2.77L (55% pred), DLCO 11.7 (38% pred) 12/2013 CT chest > progressive ILD changes compared to previous study. New Right upper lobe groundglass opacity.     Past Medical History  Diagnosis Date  . Hypertension   . Hypercholesterolemia   . Fibrocystic breast disease   . Degenerative joint disease   . Atrial fibrillation     s/p knee surgery.  maintained in SR since  . GERD (gastroesophageal reflux disease)     Past Surgical History  Procedure Laterality Date  . Tonsillectomy    . Cholecystectomy  2003  . Replacement total knee  2004    Dr Erin Sons  . Replacement total knee  12/08    left  . 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 Heart disease Father     myocardial infarction  . Diabetes Father   . Pancreatitis Mother     gallstone  . Colon cancer Neg Hx     Social History History  Substance Use Topics  . Smoking status: Former Smoker -- 1.00 packs/day for 15 years    Types: Cigarettes    Quit date: 03/31/1983  . Smokeless  tobacco: Never Used  . Alcohol Use: 0.0 oz/week    0 Not specified per week     Comment: occasional    Current Outpatient Prescriptions  Medication Sig Dispense Refill  . aspirin 325 MG EC tablet Take 325 mg by mouth daily.    . cetirizine (ZYRTEC) 10 MG tablet Take 10 mg by mouth daily.    . Cholecalciferol (VITAMIN D) 2000 UNITS tablet Take 2,000 Units by mouth daily.    . CRESTOR 10 MG tablet TAKE 1 TABLET EVERY DAY 30 tablet 5  . diltiazem (CARDIZEM) 120 MG tablet Take 60 mg by mouth 2 (two) times  daily.    . flecainide (TAMBOCOR) 150 MG tablet Take 150 mg by mouth 2 (two) times daily.    . fluticasone (FLONASE) 50 MCG/ACT nasal spray Place 2 sprays into the nose daily as needed.    . Fluticasone-Salmeterol (ADVAIR DISKUS) 250-50 MCG/DOSE AEPB Inhale 1 puff into the lungs 2 (two) times daily. 60 each 5  . meloxicam (MOBIC) 7.5 MG tablet TAKE ONE TABLET DAILY WITH FOOD 30 tablet 3  . montelukast (SINGULAIR) 10 MG tablet TAKE ONE TABLET AT BEDTIME 30 tablet 3  . omeprazole (PRILOSEC OTC) 20 MG tablet Take 20 mg by mouth daily.    Marland Kitchen. Respiratory Therapy Supplies (FLUTTER) DEVI daily.    . sertraline (ZOLOFT) 50 MG tablet TAKE ONE AND ONE-HALF TABLETS DAILY 45 tablet 3  . Spacer/Aero-Holding Chambers (AEROCHAMBER MV) inhaler Use as instructed 1 each 0  . triamterene-hydrochlorothiazide (MAXZIDE-25) 37.5-25 MG per tablet Take 1 tablet by mouth daily. (Patient taking differently: Take 0.5 tablets by mouth daily. ) 30 tablet 5   No current facility-administered medications for this visit.    Allergies  Allergen Reactions  . Iodine   . Lisinopril Cough  . Tetanus Toxoids     Hives, welts    Review of Systems  Constitutional: Positive for activity change, appetite change, fatigue and unexpected weight change (gained 10 pounds in 3 months).  HENT: Positive for congestion.   Respiratory: Positive for apnea (CPAP at night), cough and shortness of breath (with minimal exertion). Negative for wheezing.   Cardiovascular: Negative for chest pain.       Atrial fibrillation, varicose veins  Gastrointestinal:       Reflux  Genitourinary: Positive for frequency.  Musculoskeletal: Positive for arthralgias (hx B TKR).  Hematological: Bruises/bleeds easily.  Psychiatric/Behavioral: The patient is nervous/anxious.   All other systems reviewed and are negative.   BP 160/76 mmHg  Pulse 80  Resp 20  Ht 5' 5.5" (1.664 m)  Wt 340 lb (154.223 kg)  BMI 55.70 kg/m2  SpO2 92% Physical Exam   Constitutional: She is oriented to person, place, and time. No distress.  Morbidly obese  HENT:  Head: Normocephalic and atraumatic.  Eyes: EOM are normal. Pupils are equal, round, and reactive to light.  Neck: Neck supple. No thyromegaly present.  Cardiovascular: Normal rate, regular rhythm, normal heart sounds and intact distal pulses.  Exam reveals no gallop.   No murmur heard. Pulmonary/Chest: Effort normal.  Inspiratory crackles on left, no wheezing  Abdominal: Soft. There is no tenderness.  Musculoskeletal: She exhibits no edema.  Lymphadenopathy:    She has no cervical adenopathy.  Neurological: She is alert and oriented to person, place, and time. No cranial nerve deficit.  Skin: Skin is warm and dry.  Psychiatric: She has a normal mood and affect.  Vitals reviewed.    Diagnostic Tests: CT and PFTs  reviewed  Impression: 63 year old woman with an 8 month history of cough and shortness of breath who has progressive interstitial disease/fibrosis on CT scan. The differential diagnosis includes ILD, hypersensitivity pneumonitis, BOOP.  I reviewed the CT scan with Kristin [name] and her family, including her daughter who is an ICU nurse in [facility]. We discussed the differential diagnosis. We discussed the option of a trial of prednisone. She feels strongly that she does not want to be on prednisone. We discussed the option of a thoracoscopic lung biopsy for diagnostic purposes.  I discussed thoracoscopic lung biopsy with Kristin [name] and her family. We reviewed the indications, risks, benefits, and alternatives. They understand that this is a diagnostic and not a therapeutic procedure. I reviewed the general nature of the operation, including the need for general anesthesia, the incisions to be used, the use of a chest tube postoperatively, the expected hospital stay, and overall recovery. They understand that the risks include, but are not limited to death, MI, DVT, PE,  bleeding, possible need for transfusion, infection, prolonged air leak, cardiac arrhythmias, as well as the possibility of other unforeseeable complications.  She accepts the risks and wishes to proceed  Plan: Left VATS, lung biopsy on Wednesday, 02/14/2014

## 2014-02-14 NOTE — Op Note (Signed)
NAMMarcie [name]:  Testerman, [name]             ACCOUNT NO.:  [id number]  MEDICAL RECORD NO.:  [id number]  LOCATION:  2S01C                        FACILITY:  [facility]  PHYSICIAN:  Salvatore DecentSteven C. Dorris FetchHendrickson, M.D.DATE OF BIRTH:  1950-12-12  DATE OF PROCEDURE:  02/14/2014 DATE OF DISCHARGE:                              OPERATIVE REPORT   PREOPERATIVE DIAGNOSIS:  Interstitial lung disease, left greater than right.  POSTOPERATIVE DIAGNOSIS:  Interstitial lung disease, left greater than right.  PROCEDURE:  Left video-assisted thoracoscopy, lung biopsy x3 (left upper lobe x1, left lower lobe x2).  SURGEON:  Salvatore DecentSteven C. Dorris FetchHendrickson, MD  FIRST ASSISTANT:  Rowe ClackWayne E. Gold, PA-C  ANESTHESIA:  General.  FINDINGS:  Left lung diffusely diseased.  Frozen section of initial biopsy showed interstitial disease.  CLINICAL NOTE:  Kristin [name] is a morbidly obese 63 year old woman, who presented with chronic cough and shortness of breath.  Workup revealed evidence of interstitial lung disease.  She was referred for a lung biopsy, due to progression of disease.  The indications, risks, benefits, and alternatives were discussed in detail with the patient. She understood this was a diagnostic and not a therapeutic procedure. She accepted the risks and wished to proceed.  OPERATIVE NOTE:  Kristin [name] was brought to the operating room on February 14, 2014.  She had induction of general endotracheal anesthesia and was intubated with a double-lumen endotracheal tube.  Intravenous antibiotics were administered.  Sequential compressive devices were placed on the calves for DVT prophylaxis.  A Foley catheter was placed. She was placed in a right lateral decubitus position and the left chest was prepped and draped in the usual sterile fashion.  Single lung ventilation of the right lung was initiated and was tolerated well throughout the procedure.  An incision was made in the seventh intercostal space in the midaxillary  line.  A 5 mm trocar was advanced into the chest.  The scope then was advanced into the chest.  There was good isolation of the left lung, but the lung was relatively stiff and did not deflate to a significant degree.  A small working incision was made 2 interspaces cephalad to the original incision.  No rib spreading was performed during the procedure.  Lung was inspected, it was diffusely involved with this interstitial process.  There were essentially no areas of normal-appearing lung and area of the left lower lobe along the minor fissure was accessible for biopsy, this was done with sequential firings of endoscopic GIA stapler.  The specimen was removed and sent for frozen section.  While awaiting that result, a second biopsy was taken from the left upper lobe posterior laterally along the fissure in an area that was heavily involved on CT, a combination of purple and black staple cartridges were used on this specimen. Finally, a biopsy was taken from the inferior lateral portion of the left lower lobe along the diaphragmatic surface.  The frozen section returned showing interstitial lung disease.  All staple lines were inspected and there was good hemostasis.  A 28-French chest tube was placed through the original port incision and secured with a #1 silk suture.  The left lung was reinflated.  The working incision was closed with  a #1 Vicryl fascial suture followed by a 2-0 Vicryl subcutaneous suture, and a 3-0 Vicryl subcuticular suture.  All sponge, needle, and instrument counts were correct at the end of the procedure.  The patient was taken from the operating room to the postanesthetic care unit in good condition.     Salvatore DecentSteven C. Dorris FetchHendrickson, M.D.     SCH/MEDQ  D:  02/14/2014  T:  02/14/2014  Job:  [id number]

## 2014-02-14 NOTE — Progress Notes (Signed)
Patient ID: Kristin [name], female   DOB: 04/05/1950, 63 y.o.   MRN: [id number]  SICU Evening Rounds:  Hemodynamically stable sats 100% Chest tube output low, no air leak.  Just arrived from PACU

## 2014-02-14 NOTE — Anesthesia Procedure Notes (Signed)
Procedure Name: Intubation Date/Time: 02/14/2014 2:02 PM Performed by: Elon AlasLEE, Dawsyn Ramsaran BROWN Pre-anesthesia Checklist: Timeout performed, Patient identified, Emergency Drugs available, Suction available and Patient being monitored Patient Re-evaluated:Patient Re-evaluated prior to inductionOxygen Delivery Method: Circle system utilized Preoxygenation: Pre-oxygenation with 100% oxygen Intubation Type: IV induction Ventilation: Mask ventilation without difficulty Laryngoscope Size: Mac and 3 Grade View: Grade III Tube type: Oral Endobronchial tube: Left and Double lumen EBT and 37 Fr Number of attempts: 1 Airway Equipment and Method: Stylet Placement Confirmation: CO2 detector,  positive ETCO2 and ETT inserted through vocal cords under direct vision Secured at: 29 cm Tube secured with: Tape Dental Injury: Teeth and Oropharynx as per pre-operative assessment

## 2014-02-14 NOTE — Transfer of Care (Signed)
Immediate Anesthesia Transfer of Care Note  Patient: Kristin [name]  Procedure(s) Performed: Procedure(s): VIDEO ASSISTED THORACOSCOPY (Left) LUNG BIOPSY (Left)  Patient Location: PACU  Anesthesia Type:General  Level of Consciousness: awake  Airway & Oxygen Therapy: Patient Spontanous Breathing and Patient connected to face mask oxygen  Post-op Assessment: Report given to PACU RN, Post -op Vital signs reviewed and stable and Patient moving all extremities X 4  Post vital signs: Reviewed and stable  Complications: No apparent anesthesia complications

## 2014-02-14 NOTE — Brief Op Note (Addendum)
      [address]       [place] [id number]             [phone]     02/14/2014  3:38 PM  PATIENT:  Kristin OuHilda J [name]  63 y.o. female  PRE-OPERATIVE DIAGNOSIS:  Interstitial lung disease  POST-OPERATIVE DIAGNOSIS:  Interstitial lung disease  PROCEDURE:  Procedure(s): VIDEO ASSISTED THORACOSCOPY(LEFT) LUNG BIOPSY X3  SURGEON:  Surgeon(s): Loreli SlotSteven C Hendrickson, MD  PHYSICIAN ASSISTANT: WAYNE GOLD PA-C  ANESTHESIA:   general  SPECIMEN:  Biopsy / Limited Resection  DISPOSITION OF SPECIMEN:  Pathology  DRAINS: 1 Chest Tube(s) in the LEFT HEMITHORAX   PATIENT CONDITION:  PACU - hemodynamically stable.  PRE-OPERATIVE WEIGHT: 149kg  EBL: MINIMAL  COMPLICATIONS: NO KNOWN

## 2014-02-15 ENCOUNTER — Inpatient Hospital Stay (HOSPITAL_COMMUNITY): Payer: BC Managed Care – PPO

## 2014-02-15 LAB — CBC
HCT: 44.1 % (ref 36.0–46.0)
Hemoglobin: 13.8 g/dL (ref 12.0–15.0)
MCH: 28.5 pg (ref 26.0–34.0)
MCHC: 31.3 g/dL (ref 30.0–36.0)
MCV: 90.9 fL (ref 78.0–100.0)
PLATELETS: 219 10*3/uL (ref 150–400)
RBC: 4.85 MIL/uL (ref 3.87–5.11)
RDW: 13.4 % (ref 11.5–15.5)
WBC: 10.1 10*3/uL (ref 4.0–10.5)

## 2014-02-15 LAB — BASIC METABOLIC PANEL
Anion gap: 13 (ref 5–15)
BUN: 13 mg/dL (ref 6–23)
CO2: 26 mEq/L (ref 19–32)
CREATININE: 1.04 mg/dL (ref 0.50–1.10)
Calcium: 8.3 mg/dL — ABNORMAL LOW (ref 8.4–10.5)
Chloride: 100 mEq/L (ref 96–112)
GFR calc non Af Amer: 56 mL/min — ABNORMAL LOW (ref >90)
GFR, EST AFRICAN AMERICAN: 64 mL/min — AB (ref >90)
Glucose, Bld: 114 mg/dL — ABNORMAL HIGH (ref 70–99)
Potassium: 4.2 mEq/L (ref 3.7–5.3)
Sodium: 139 mEq/L (ref 137–147)

## 2014-02-15 MED ORDER — ENOXAPARIN SODIUM 40 MG/0.4ML ~~LOC~~ SOLN
40.0000 mg | SUBCUTANEOUS | Status: DC
  Administered 2014-02-15 – 2014-02-18 (×4): 40 mg via SUBCUTANEOUS
  Filled 2014-02-15 (×4): qty 0.4

## 2014-02-15 MED ORDER — MELOXICAM 7.5 MG PO TABS
7.5000 mg | ORAL_TABLET | Freq: Every day | ORAL | Status: DC
  Administered 2014-02-16 – 2014-03-01 (×14): 7.5 mg via ORAL
  Filled 2014-02-15 (×14): qty 1

## 2014-02-15 NOTE — Progress Notes (Signed)
Patient became hypoxic upon ambulation in hallway. At about 150 ft patient became dizzy, O2 sats dropped into the 80's and she needed to take a break. She was able to rebound and walk the remainder of the trip back to her room. O2 sat when sitting back in the chair was in the low 70's. Placed on non rebreather, coughing and deep breathing and with RT at bedside patient was able to recover. Will continue on non rebreather and re-assess. Franki CabotBrianna Marquis Diles, RN

## 2014-02-15 NOTE — Progress Notes (Signed)
Dr. Dorris FetchHendrickson informed of patient's O2 sat decline with activity.  Currently on 6L [place] and has to intermittently place non-rebreather for sats that decline into low 80's. Instructed to give patient flutter valve for bedside. Will continue to monitor. Franki CabotBrianna Daulton Harbaugh, RN

## 2014-02-15 NOTE — Progress Notes (Signed)
Patient's urine output decreased significantly from approximately 60 mL/hr to 20 mL/hr despite the patient describing a "fullness" in her bladder.  The foley tubing was manipulated to promote drainage and the line was flushed in an attempt to promote drainage.  Per the bladder scan the maximum amount of identified urine was 116 mL.  Will continue to monitor urine output hourly and will monitor the patient carefully.  Devona KonigAvery Mikael Debell, RN

## 2014-02-15 NOTE — Care Management Note (Addendum)
    Page 1 of 2   03/01/2014     3:09:18 PM CARE MANAGEMENT NOTE 03/01/2014  Patient:  Kristin [name],Kristin [name]   Account Number:  [id number]  Date Initiated:  02/15/2014  Documentation initiated by:  MAYO,HENRIETTA  Subjective/Objective Assessment:   s/p VATS/lung bx; lives with spouse, on nocturnal CPAP    PCP  Kristin [name]     Action/Plan:   has oxygen at home   Anticipated DC Date:  03/02/2014   Anticipated DC Plan:  ACUTE TO ACUTE TRANS      DC Planning Services  CM consult      Choice offered to / List presented to:     DME arranged  OTHER - SEE COMMENT      DME agency  [facility]        Status of service:  Completed, signed off Medicare Important Message given?  NO (If response is "NO", the following Medicare IM given date fields will be blank) Date Medicare IM given:   Medicare IM given by:   Date Additional Medicare IM given:   Additional Medicare IM given by:    Discharge Disposition:  ACUTE TO ACUTE TRANS  Per UR Regulation:  Reviewed for med. necessity/level of care/duration of stay  If discussed at Long Length of Stay Meetings, dates discussed:   02/20/2014  02/27/2014  03/01/2014    Comments:  03/01/14- 1500- Kristin PieriniKristi Maniya Donovan RN, BSN [phone] Pt with  bil chest tubes now, cont. to require high 02 needs- [facility]- acute [facility] acute. [place] form on shadow chart.  02/26/14- 1000- Kristin PieriniKristi Carlisle Torgeson RN, BSN [phone] Per bedside- RN- pt [facility]  02/25/14 12:00 CM received call from RN stating MD wants pt to have oxymizer for pt.  CM called [facility] DME rep who states he will deliver oxymizer to room prior to discharge.  No other Cm needs were communicated.  Kristin [name], BSN, [place] [phone].  02/15/14 1100 Henrietta Mayo RN MSN BSN CCM Pt has supportive dtrs, [place]. Ambulating with staff, O2 sats dropped into 70s, rebounded with rest.

## 2014-02-15 NOTE — Progress Notes (Signed)
Utilization Review Completed.Elanor Cale T11/19/2015  

## 2014-02-15 NOTE — Progress Notes (Signed)
Patient ID: Kristin [name], female   DOB: 01/23/1951, 63 y.o.   MRN: [id number] EVENING ROUNDS NOTE :     [address]       [place] [id number]             [phone]                 1 Day Post-Op Procedure(s) (LRB): VIDEO ASSISTED THORACOSCOPY (Left) LUNG BIOPSY (Left)  Total Length of Stay:  LOS: 1 day  BP 112/57 mmHg  Pulse 60  Temp(Src) 98.3 F (36.8 C) (Oral)  Resp 25  SpO2 92%  .Intake/Output      11/18 0701 - 11/19 0700 11/19 0701 - 11/20 0700   P.O. 480 480   I.V. 2100 500   Total Intake 2580 980   Urine 825 405   Blood 50    Chest Tube 210 50   Total Output 1085 455   Net +1495 +525          . dextrose 5 % and 0.9% NaCl 50 mL/hr at 02/15/14 1000     Lab Results  Component Value Date   WBC 10.1 02/15/2014   HGB 13.8 02/15/2014   HCT 44.1 02/15/2014   PLT 219 02/15/2014   GLUCOSE 114* 02/15/2014   CHOL 181 01/31/2014   TRIG 65.0 01/31/2014   HDL 64.30 01/31/2014   LDLCALC 104* 01/31/2014   ALT 16 02/12/2014   AST 24 02/12/2014   NA 139 02/15/2014   K 4.2 02/15/2014   CL 100 02/15/2014   CREATININE 1.04 02/15/2014   BUN 13 02/15/2014   CO2 26 02/15/2014   TSH 2.78 01/31/2014   INR 1.11 02/12/2014   Chest tube still in, no air leak, sig sat drops with activity  Delight OvensEdward B Jette Lewan MD  Beeper [phone] Office [phone] 02/15/2014 6:09 PM

## 2014-02-15 NOTE — Progress Notes (Signed)
      [address]       [place] [id number]             [phone]        CARDIOTHORACIC SURGERY PROGRESS NOTE   R1 Day Post-Op Procedure(s) (LRB): VIDEO ASSISTED THORACOSCOPY (Left) LUNG BIOPSY (Left)  Subjective: Patient appears to be doing well, and has no complaints of pain, only some discomfort around area of chest tube. She reports the return of her sometimes productive cough that was present pre-op that occurs when she uses incentive spirometer. She also reports that oxygen has improved her SOB. Objective: Vital signs: BP Readings from Last 1 Encounters:  02/15/14 106/44   Pulse Readings from Last 1 Encounters:  02/15/14 51   Resp Readings from Last 1 Encounters:  02/15/14 17   Temp Readings from Last 1 Encounters:  02/15/14 98 F (36.7 C) Oral    Hemodynamics:    Physical Exam:  Rhythm:   NSR  Breath sounds: Mild crackles heard throughout, with left greater than right  Heart sounds:  RRR, no murmurs, gallops, or rubs  Incisions:  Clean and dry  Abdomen:  Soft, non tender, non distended  Extremities:  Warm, well perfused   Intake/Output from previous day: 11/18 0701 - 11/19 0700 In: 2580 [P.O.:480; I.V.:2100] Out: 1085 [Urine:825; Blood:50; Chest Tube:210] Intake/Output this shift:    Lab Results:  CBC: Recent Labs  02/12/14 1302 02/15/14 0259  WBC 9.9 10.1  HGB 16.3* 13.8  HCT 50.2* 44.1  PLT 232 219    BMET:  Recent Labs  02/12/14 1302 02/15/14 0259  NA 135* 139  K 4.8 4.2  CL 97 100  CO2 23 26  GLUCOSE 93 114*  BUN 15 13  CREATININE 0.97 1.04  CALCIUM 9.5 8.3*     CBG (last 3)  No results for input(s): GLUCAP in the last 72 hours.  ABG    Component Value Date/Time   PHART 7.389 02/14/2014 2025   PCO2ART 51.3* 02/14/2014 2025   PO2ART 83.0 02/14/2014 2025   HCO3 31.0* 02/14/2014 2025   TCO2 33 02/14/2014 2025   O2SAT 96.0 02/14/2014 2025    CXR: EXAM: PORTABLE CHEST - 1 VIEW  COMPARISON:  February 14, 2014.  FINDINGS: Stable cardiomegaly. Left-sided chest tube does not appear to be changed in position. Minimal left apical pneumothorax is noted. Slightly increased right upper lobe opacity is noted concerning for possible developing pneumonia. Stable left basilar opacity is noted. Stable left upper lobe opacity is noted.  IMPRESSION: Left-sided chest tube is unchanged in position. Minimal left apical pneumothorax is noted. Stable left upper lobe and basilar opacities are noted. Slightly increased right upper lobe opacity is noted concerning for developing pneumonia. Followup radiographs are recommended.   Assessment/Plan: S/P Procedure(s) (LRB): VIDEO ASSISTED THORACOSCOPY (Left) LUNG BIOPSY (Left)  Patient progressing well post op day 1. Total chest tube output over 24 hours is 210 mL. CXR reveals possible developing pneumonia, with recommendation to repeat radiographs. Patient has remained afebrile--doubtful she will require empiric antibiotic therapy. Lung bx pending.   Elvis Coilooper, Rebecca, PA-S2 02/15/2014 8:16 AM  Patient seen and examined CXR shows a tiny lateral PTX, severe ILD-  no air leak and minimal drainage- will place CT to water seal Mobilize Enoxaparin + SCD for DVT prophylaxis Ambulate Transfer to 3S when bed available

## 2014-02-15 NOTE — Plan of Care (Signed)
Problem: Phase I Progression Outcomes Goal: Hemodynamically stable Outcome: Completed/Met Date Met:  02/15/14 Patient is without vasopressors and maintained hemodynamic stability. Goal: Chest tube drainage < 200 cc/hr Outcome: Completed/Met Date Met:  02/15/14 Chest tube output has been less than 30 mL/hr. Goal: O2 sats > or equal to 88% with O2 Outcome: Progressing O2 saturation mostly above 93%.  At times, with activity O2 saturation drops into the high 70s and low 80s, but rebounds quickly.  Patient wears CPAP at night.

## 2014-02-16 ENCOUNTER — Inpatient Hospital Stay (HOSPITAL_COMMUNITY): Payer: BC Managed Care – PPO

## 2014-02-16 ENCOUNTER — Encounter (HOSPITAL_COMMUNITY): Payer: Self-pay | Admitting: Thoracic Surgery (Cardiothoracic Vascular Surgery)

## 2014-02-16 DIAGNOSIS — J8410 Pulmonary fibrosis, unspecified: Secondary | ICD-10-CM

## 2014-02-16 DIAGNOSIS — J9621 Acute and chronic respiratory failure with hypoxia: Secondary | ICD-10-CM

## 2014-02-16 LAB — COMPREHENSIVE METABOLIC PANEL
ALK PHOS: 71 U/L (ref 39–117)
ALT: 13 U/L (ref 0–35)
AST: 23 U/L (ref 0–37)
Albumin: 2.4 g/dL — ABNORMAL LOW (ref 3.5–5.2)
Anion gap: 11 (ref 5–15)
BILIRUBIN TOTAL: 0.5 mg/dL (ref 0.3–1.2)
BUN: 7 mg/dL (ref 6–23)
CHLORIDE: 98 meq/L (ref 96–112)
CO2: 28 mEq/L (ref 19–32)
Calcium: 8.3 mg/dL — ABNORMAL LOW (ref 8.4–10.5)
Creatinine, Ser: 0.86 mg/dL (ref 0.50–1.10)
GFR calc Af Amer: 81 mL/min — ABNORMAL LOW (ref >90)
GFR calc non Af Amer: 70 mL/min — ABNORMAL LOW (ref >90)
Glucose, Bld: 118 mg/dL — ABNORMAL HIGH (ref 70–99)
POTASSIUM: 3.6 meq/L — AB (ref 3.7–5.3)
Sodium: 137 mEq/L (ref 137–147)
TOTAL PROTEIN: 6.4 g/dL (ref 6.0–8.3)

## 2014-02-16 LAB — CBC
HEMATOCRIT: 42.4 % (ref 36.0–46.0)
Hemoglobin: 13.4 g/dL (ref 12.0–15.0)
MCH: 28.6 pg (ref 26.0–34.0)
MCHC: 31.6 g/dL (ref 30.0–36.0)
MCV: 90.4 fL (ref 78.0–100.0)
Platelets: 222 10*3/uL (ref 150–400)
RBC: 4.69 MIL/uL (ref 3.87–5.11)
RDW: 13.1 % (ref 11.5–15.5)
WBC: 11.1 10*3/uL — AB (ref 4.0–10.5)

## 2014-02-16 MED ORDER — LEVALBUTEROL HCL 0.63 MG/3ML IN NEBU: 0.6300 mg | INHALATION_SOLUTION | RESPIRATORY_TRACT | Status: DC | PRN

## 2014-02-16 MED ORDER — POTASSIUM CHLORIDE CRYS ER 20 MEQ PO TBCR
40.0000 meq | EXTENDED_RELEASE_TABLET | Freq: Two times a day (BID) | ORAL | Status: AC
  Administered 2014-02-16 (×2): 40 meq via ORAL
  Filled 2014-02-16 (×2): qty 2

## 2014-02-16 MED ORDER — POTASSIUM CHLORIDE CRYS ER 20 MEQ PO TBCR: 40.0000 meq | EXTENDED_RELEASE_TABLET | Freq: Once | ORAL | Status: DC

## 2014-02-16 MED ORDER — METHYLPREDNISOLONE SODIUM SUCC 125 MG IJ SOLR
60.0000 mg | Freq: Four times a day (QID) | INTRAMUSCULAR | Status: DC
  Administered 2014-02-16 – 2014-02-18 (×7): 60 mg via INTRAVENOUS
  Filled 2014-02-16 (×4): qty 0.96
  Filled 2014-02-16: qty 2
  Filled 2014-02-16: qty 0.96
  Filled 2014-02-16: qty 2
  Filled 2014-02-16: qty 0.96
  Filled 2014-02-16: qty 2
  Filled 2014-02-16 (×3): qty 0.96

## 2014-02-16 MED ORDER — FUROSEMIDE 10 MG/ML IJ SOLN
40.0000 mg | Freq: Once | INTRAMUSCULAR | Status: AC
  Administered 2014-02-16: 40 mg via INTRAVENOUS
  Filled 2014-02-16: qty 4

## 2014-02-16 MED ORDER — LEVALBUTEROL HCL 0.63 MG/3ML IN NEBU
0.6300 mg | INHALATION_SOLUTION | Freq: Four times a day (QID) | RESPIRATORY_TRACT | Status: DC
  Administered 2014-02-16 – 2014-02-23 (×21): 0.63 mg via RESPIRATORY_TRACT
  Filled 2014-02-16 (×46): qty 3

## 2014-02-16 MED ORDER — BUDESONIDE 0.25 MG/2ML IN SUSP
0.2500 mg | Freq: Four times a day (QID) | RESPIRATORY_TRACT | Status: DC
  Administered 2014-02-16 – 2014-02-20 (×11): 0.25 mg via RESPIRATORY_TRACT
  Filled 2014-02-16 (×21): qty 2

## 2014-02-16 NOTE — Progress Notes (Signed)
[address]       [place] [id number]             [phone]        CARDIOTHORACIC SURGERY PROGRESS NOTE   R2 Days Post-Op Procedure(s) (LRB): VIDEO ASSISTED THORACOSCOPY (Left) LUNG BIOPSY (Left)  Subjective: Patient continues to have productive cough. Oxygen sats decline significantly with activity, and at times while sitting, have dropped into the low 80s despite 6L oxygen via nasal cannula, requiring use of non-rebreather.   Objective: Vital signs: BP Readings from Last 1 Encounters:  02/16/14 109/43   Pulse Readings from Last 1 Encounters:  02/16/14 59   Resp Readings from Last 1 Encounters:  02/16/14 21   Temp Readings from Last 1 Encounters:  02/16/14 98.8 F (37.1 C) Oral    Hemodynamics:    Physical Exam:  Rhythm:   NSR  Breath sounds: Mild crackles throughout  Heart sounds:  RRR, no murmurs, gallops, or rubs  Incisions:  Clean and dry  Abdomen:  Soft, non distended  Extremities:  Warm, well perfused, non edematous   Intake/Output from previous day: 11/19 0701 - 11/20 0700 In: 2500 [P.O.:1200; I.V.:1300] Out: 2030 [Urine:1810; Chest Tube:220] Intake/Output this shift:    Lab Results:  CBC: Recent Labs  02/15/14 0259 02/16/14 0220  WBC 10.1 11.1*  HGB 13.8 13.4  HCT 44.1 42.4  PLT 219 222    BMET:  Recent Labs  02/15/14 0259 02/16/14 0220  NA 139 137  K 4.2 3.6*  CL 100 98  CO2 26 28  GLUCOSE 114* 118*  BUN 13 7  CREATININE 1.04 0.86  CALCIUM 8.3* 8.3*     CBG (last 3)  No results for input(s): GLUCAP in the last 72 hours.  ABG    Component Value Date/Time   PHART 7.389 02/14/2014 2025   PCO2ART 51.3* 02/14/2014 2025   PO2ART 83.0 02/14/2014 2025   HCO3 31.0* 02/14/2014 2025   TCO2 33 02/14/2014 2025   O2SAT 96.0 02/14/2014 2025    CXR: EXAM: PORTABLE CHEST - 1 VIEW  COMPARISON: February 15, 2014 portable chest  FINDINGS: The minimal left apical pneumothorax is not evident today. The  left chest tube tip is unchanged in position lying over the posterior lateral aspect of the left seventh rib. The pulmonary interstitial markings remain increased bilaterally and are unchanged allowing for differences in radiographic technique. The left heart border and left hemidiaphragm remain largely obscured. The central pulmonary vascularity is prominent.  IMPRESSION: Interval resolution of the tiny left apical pneumothorax. Stable appearance of the bilaterally increased pulmonary interstitial markings consistent with known interstitial lung disease.   Electronically Signed  By: David SwazilandJordan  On: 02/16/2014 08:12   Assessment/Plan: S/P Procedure(s) (LRB): VIDEO ASSISTED THORACOSCOPY (Left) LUNG BIOPSY (Left)  Patient with continued productive cough and somewhat unstable O2 sats that decline significantly with minimal activity. CXR from today reveals resolution of mild PTX seen yesterday. Anticipate removal on one chest tube today. Encouraged use of flutter valve and incentive spirometer. Labs show mild hypokalemia- 40 mg PO Potassium chloride ordered x 2.   Elvis Coilooper, Rebecca, PA-S2  02/16/2014 8:16 AM  ADDENDUM:  Pulm/CCM consulted to assist with management of pulm issues- discussed with Brett CanalesSteve Minor, NP, who has already seen the patient.    CT with minimal output and no air leak.  CXR at 10:00 shows no pneumothorax, so will plan to d/c CT.   Hypokalemia- supplement K+.  She is not  using PCA, so will d/c and transition to po pain meds.

## 2014-02-16 NOTE — Progress Notes (Signed)
CT reviewed with Dr. Dorris FetchHendrickson.  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  No evidence of any infection.  Will start solumedrol 60 mg IV q6 hours and hold in the ICU for monitoring overnight.  Alyson ReedyWesam G. Skyeler Smola, M.D. [facility] Pulmonary/Critical Care Medicine. Pager: [phone]. After hours pager: [phone].

## 2014-02-16 NOTE — Clinical Documentation Improvement (Signed)
Presents with Pulmonary Fibrosis; VATS with BX's performed.   Post Op, patient hypoxic with Sats in low 80's  Tachypneic with respirations ranging from 13 to 29  Has been on CPAP, weaned to 6L FiO2 and remains hypoxic at times  Please provide a diagnosis associated with the above clinical indicators and treatment provided and document findings in next progress note and discharge summary if applicable.  Acute Respiratory Failure Acute on Chronic Respiratory Failure Chronic Respiratory Failure Other Condition  Thank You, Shellee MiloEileen T Yu Peggs ,RN Clinical Documentation Specialist:  [phone]  [facility]- Health Information Management

## 2014-02-16 NOTE — Consult Note (Signed)
PULMONARY / CRITICAL CARE MEDICINE   Name: Geni BersHilda J Achey MRN: [id number] DOB: 03/31/1950    ADMISSION DATE:  02/14/2014 CONSULTATION DATE:  11/20  REFERRING MD :  CTS  CHIEF COMPLAINT:  SOB  INITIAL PRESENTATION:For Left VATS for ILD diagnosis  STUDIES:    SIGNIFICANT EVENTS: 11/18 left vats   HISTORY OF PRESENT ILLNESS:  63 yo female who has been followed by Dr. Kendrick FriesMcQuaid for ILD thought to stem from pneumonia in 2/15 with progressive lung changes resulting in hypoxia. She underwent left VATS 11/18 and on 11/20 was hypoxic , requiring 75% NRB support. Treated with lasix and PCCM asked to evaluate.  PAST MEDICAL HISTORY :   has a past medical history of Hypertension; Hypercholesterolemia; Fibrocystic breast disease; Degenerative joint disease; Atrial fibrillation; GERD (gastroesophageal reflux disease); Shortness of breath dyspnea; Environmental and seasonal allergies; and Sleep apnea.  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 Prior to Admission medications   Medication Sig Start Date End Date Taking? Authorizing Provider  aspirin 325 MG EC tablet Take 325 mg by mouth daily.   Yes Historical Provider, MD  cetirizine (ZYRTEC) 10 MG tablet Take 10 mg by mouth daily.   Yes Historical Provider, MD  Cholecalciferol (VITAMIN D) 2000 UNITS tablet Take 2,000 Units by mouth daily.   Yes Historical Provider, MD  CRESTOR 10 MG tablet TAKE 1 TABLET EVERY DAY 03/20/13  Yes Dale Durhamharlene Scott, MD  diltiazem (CARDIZEM) 120 MG tablet Take 60 mg by mouth 2 (two) times daily.   Yes Historical Provider, MD  flecainide (TAMBOCOR) 150 MG tablet Take 150 mg by mouth 2 (two) times daily.   Yes Historical Provider, MD  fluticasone (FLONASE) 50 MCG/ACT nasal spray Place 2 sprays into the nose daily as needed for allergies.  07/07/12  Yes Dale Durhamharlene Scott, MD   Fluticasone-Salmeterol (ADVAIR DISKUS) 250-50 MCG/DOSE AEPB Inhale 1 puff into the lungs 2 (two) times daily. 09/25/13  Yes Lupita Leashouglas B McQuaid, MD  meloxicam (MOBIC) 7.5 MG tablet TAKE ONE TABLET DAILY WITH FOOD 01/22/14  Yes Sherlene Shamseresa L Tullo, MD  montelukast (SINGULAIR) 10 MG tablet TAKE ONE TABLET AT BEDTIME 01/22/14  Yes Sherlene Shamseresa L Tullo, MD  omeprazole (PRILOSEC OTC) 20 MG tablet Take 20 mg by mouth daily.   Yes Historical Provider, MD  prednisoLONE acetate (PRED FORTE) 1 % ophthalmic suspension Place 1 drop into both eyes at bedtime.   Yes Historical Provider, MD  sertraline (ZOLOFT) 50 MG tablet TAKE ONE AND ONE-HALF TABLETS DAILY 01/22/14  Yes Sherlene Shamseresa L Tullo, MD  Respiratory Therapy Supplies (FLUTTER) DEVI daily.    Historical Provider, MD  Spacer/Aero-Holding Chambers (AEROCHAMBER MV) inhaler Use as instructed 08/29/13   Lupita Leashouglas B McQuaid, MD  triamterene-hydrochlorothiazide (MAXZIDE-25) 37.5-25 MG per tablet Take 1 tablet by mouth daily. Patient taking differently: Take 0.5 tablets by mouth daily.  07/27/13   Dale Durhamharlene Scott, MD   Allergies  Allergen Reactions  . Iodine Hives  . Lisinopril Cough  . Tetanus Toxoids     Hives, welts    FAMILY HISTORY:  has no family status information on file.  SOCIAL HISTORY:  reports that she quit smoking about 30 years ago. Her smoking use included Cigarettes. She has a 15 pack-year smoking history. She has never used smokeless tobacco. She reports that she drinks alcohol. She reports that she does not use illicit drugs.  REVIEW OF SYSTEMS: 10 point review of system taken,  please see HPI for positives and negatives.   SUBJECTIVE:   VITAL SIGNS: Temp:  [97.8 F (36.6 C)-100 F (37.8 C)] 98.8 F (37.1 C) (11/20 0737) Pulse Rate:  [52-85] 59 (11/20 0600) Resp:  [12-29] 13 (11/20 0800) BP: (95-143)/(38-72) 109/43 mmHg (11/20 0600) SpO2:  [85 %-99 %] 96 % (11/20 0824) FiO2 (%):  [96 %] 96 % (11/20 0800) HEMODYNAMICS:   VENTILATOR SETTINGS: Vent  Mode:  [-]  FiO2 (%):  [96 %] 96 % INTAKE / OUTPUT:  Intake/Output Summary (Last 24 hours) at 02/16/14 1130 Last data filed at 02/16/14 0800  Gross per 24 hour  Intake   1790 ml  Output   1895 ml  Net   -105 ml    PHYSICAL EXAMINATION: General:  MOWFNAD@rest  Neuro:  Intact HEENT:  No JVD/LAN Cardiovascular:  HSR RRR Lungs:  Decreased bs left base, left ct unremarkable Abdomen:  Soft +bs Musculoskeletal:  intact Skin:  Warm and dry  LABS:  CBC  Recent Labs Lab 02/12/14 1302 02/15/14 0259 02/16/14 0220  WBC 9.9 10.1 11.1*  HGB 16.3* 13.8 13.4  HCT 50.2* 44.1 42.4  PLT 232 219 222   Coag's  Recent Labs Lab 02/12/14 1302  APTT 26  INR 1.11   BMET  Recent Labs Lab 02/12/14 1302 02/15/14 0259 02/16/14 0220  NA 135* 139 137  K 4.8 4.2 3.6*  CL 97 100 98  CO2 23 26 28   BUN 15 13 7   CREATININE 0.97 1.04 0.86  GLUCOSE 93 114* 118*   Electrolytes  Recent Labs Lab 02/12/14 1302 02/15/14 0259 02/16/14 0220  CALCIUM 9.5 8.3* 8.3*   Sepsis Markers No results for input(s): LATICACIDVEN, PROCALCITON, O2SATVEN in the last 168 hours. ABG  Recent Labs Lab 02/12/14 1303 02/14/14 2025  PHART 7.439 7.389  PCO2ART 40.1 51.3*  PO2ART 56.9* 83.0   Liver Enzymes  Recent Labs Lab 02/12/14 1302 02/16/14 0220  AST 24 23  ALT 16 13  ALKPHOS 87 71  BILITOT 0.6 0.5  ALBUMIN 3.3* 2.4*   Cardiac Enzymes No results for input(s): TROPONINI, PROBNP in the last 168 hours. Glucose No results for input(s): GLUCAP in the last 168 hours.  Imaging Dg Chest Port 1 View  02/15/2014   CLINICAL DATA:  Chest tube placement.  EXAM: PORTABLE CHEST - 1 VIEW  COMPARISON:  February 14, 2014.  FINDINGS: Stable cardiomegaly. Left-sided chest tube does not appear to be changed in position. Minimal left apical pneumothorax is noted. Slightly increased right upper lobe opacity is noted concerning for possible developing pneumonia. Stable left basilar opacity is noted. Stable  left upper lobe opacity is noted.  IMPRESSION: Left-sided chest tube is unchanged in position. Minimal left apical pneumothorax is noted. Stable left upper lobe and basilar opacities are noted. Slightly increased right upper lobe opacity is noted concerning for developing pneumonia. Followup radiographs are recommended.   Electronically Signed   By: Roque LiasJames  Green M.D.   On: 02/15/2014 07:49     ASSESSMENT / PLAN:  PULMONARY OETT A ILD post left VATS 11/18 OSA Hypoxia Left VATS  Suspected volume overload P:   O2 as needed NIMVS may be option instead of cpap nocturnally  Diuresis  Pulmonary toilet Use nebulized bronchodilators Stay in ICU, no tx to sdu May need intubation if poor response to current interventions  Titrate O2 down for sat of 85-88%.  CARDIOVASCULAR CVL A:  Hx of PAF on dilt, flecainide Bradycardia  P:  Consider decreasing antiarrythmic drug doses.  RENAL A:   No acute issue P:   Monitor  GASTROINTESTINAL A:   MO GERD P:   PPI  HEMATOLOGIC A:  No acute issue P:  Monitor  INFECTIOUS A:   No acute issue P:   Monitor  ENDOCRINE A:  No acute issue P:   Monitor  NEUROLOGIC A:  No acute issue P:   RASS goal: 1 Intact  FAMILY  - Updates: Family updated at bedside  - Inter-disciplinary family meet or Palliative Care meeting due by:  day 7  TODAY'S SUMMARY:  63 yo post VATS (left) 11/18 for diagnosis of ILD. 11/20 hypoxic , requiring 75% NRB and desating with any activity.  Brett Canales Minor ACNP [facility] PCCM Pager [phone] till 3 pm If no answer page [phone] 02/16/2014, 11:32 AM  Diagnosis unknown at this time, pathology pending and I have no access to the CT, family will bring CT for review.  Most important issue right now is to not allow too much O2 to avoid oxidation of the lungs.  If all cultures remain negative will consider steroids.  Hold in the ICU overnight for observation given high potential for deterioration.  My CC  time independent of extender 40 min.  Patient seen and examined, agree with above note.  I dictated the care and orders written for this patient under my direction.  Alyson Reedy, MD [phone]

## 2014-02-16 NOTE — Progress Notes (Signed)
T CTS p.m. Rounds  Patient resting comfortably on oxygen Vital signs are stable Visiting family Appreciate critical care service input with steroids planned

## 2014-02-17 ENCOUNTER — Inpatient Hospital Stay (HOSPITAL_COMMUNITY): Payer: BC Managed Care – PPO

## 2014-02-17 DIAGNOSIS — R0902 Hypoxemia: Secondary | ICD-10-CM

## 2014-02-17 DIAGNOSIS — J8489 Other specified interstitial pulmonary diseases: Secondary | ICD-10-CM

## 2014-02-17 LAB — BASIC METABOLIC PANEL
Anion gap: 11 (ref 5–15)
BUN: 16 mg/dL (ref 6–23)
CALCIUM: 8.5 mg/dL (ref 8.4–10.5)
CO2: 27 meq/L (ref 19–32)
CREATININE: 0.99 mg/dL (ref 0.50–1.10)
Chloride: 99 mEq/L (ref 96–112)
GFR calc Af Amer: 68 mL/min — ABNORMAL LOW (ref >90)
GFR calc non Af Amer: 59 mL/min — ABNORMAL LOW (ref >90)
Glucose, Bld: 151 mg/dL — ABNORMAL HIGH (ref 70–99)
Potassium: 4.6 mEq/L (ref 3.7–5.3)
Sodium: 137 mEq/L (ref 137–147)

## 2014-02-17 LAB — CBC
HCT: 45 % (ref 36.0–46.0)
HEMOGLOBIN: 14.2 g/dL (ref 12.0–15.0)
MCH: 28.5 pg (ref 26.0–34.0)
MCHC: 31.6 g/dL (ref 30.0–36.0)
MCV: 90.2 fL (ref 78.0–100.0)
Platelets: 243 10*3/uL (ref 150–400)
RBC: 4.99 MIL/uL (ref 3.87–5.11)
RDW: 13.2 % (ref 11.5–15.5)
WBC: 10.3 10*3/uL (ref 4.0–10.5)

## 2014-02-17 LAB — PHOSPHORUS: Phosphorus: 2.8 mg/dL (ref 2.3–4.6)

## 2014-02-17 LAB — MAGNESIUM: Magnesium: 1.9 mg/dL (ref 1.5–2.5)

## 2014-02-17 MED ORDER — POTASSIUM CHLORIDE CRYS ER 20 MEQ PO TBCR
40.0000 meq | EXTENDED_RELEASE_TABLET | Freq: Three times a day (TID) | ORAL | Status: AC
  Administered 2014-02-17 (×2): 40 meq via ORAL
  Filled 2014-02-17 (×2): qty 2

## 2014-02-17 MED ORDER — FUROSEMIDE 10 MG/ML IJ SOLN
40.0000 mg | Freq: Three times a day (TID) | INTRAMUSCULAR | Status: AC
  Administered 2014-02-17 (×2): 40 mg via INTRAVENOUS
  Filled 2014-02-17 (×2): qty 4

## 2014-02-17 MED ORDER — MAGNESIUM SULFATE 2 GM/50ML IV SOLN
2.0000 g | Freq: Once | INTRAVENOUS | Status: AC
  Administered 2014-02-17: 2 g via INTRAVENOUS
  Filled 2014-02-17: qty 50

## 2014-02-17 NOTE — Progress Notes (Signed)
Pola CornELINK MD notified of patient's marginal urine output.  No further orders at this time, will continue to monitor.  Devona KonigAvery Dasha Kawabata, RN

## 2014-02-17 NOTE — Progress Notes (Signed)
PULMONARY / CRITICAL CARE MEDICINE   Name: Kristin [name] MRN: [id number] DOB: 04/23/1950    ADMISSION DATE:  02/14/2014 CONSULTATION DATE:  11/20  REFERRING MD :  CTS  CHIEF COMPLAINT:  SOB  INITIAL PRESENTATION: 63 yo female who has been followed by Dr. Kendrick FriesMcQuaid for ILD thought to stem from pneumonia in 2/15 with progressive lung changes resulting in hypoxia. She underwent left VATS 11/18 and on 11/20 was hypoxic , requiring 75% NRB support. Treated with lasix and PCCM asked to evaluate.  STUDIES:    SIGNIFICANT EVENTS: 11/18 left vats  SUBJECTIVE: No events overnight, feels much better this AM.  VITAL SIGNS: Temp:  [98.6 F (37 C)-99.5 F (37.5 C)] 98.6 F (37 C) (11/21 0000) Pulse Rate:  [47-79] 62 (11/21 0700) Resp:  [14-31] 17 (11/21 0700) BP: (105-134)/(45-65) 127/62 mmHg (11/21 0700) SpO2:  [85 %-100 %] 89 % (11/21 0700) FiO2 (%):  [100 %] 100 % (11/20 0930) HEMODYNAMICS:   VENTILATOR SETTINGS: Vent Mode:  [-]  FiO2 (%):  [100 %] 100 % INTAKE / OUTPUT:  Intake/Output Summary (Last 24 hours) at 02/17/14 0824 Last data filed at 02/17/14 0700  Gross per 24 hour  Intake    360 ml  Output   1960 ml  Net  -1600 ml    PHYSICAL EXAMINATION: General:  MOWFNAD@rest  Neuro:  Intact HEENT:  No JVD/LAN Cardiovascular:  HSR RRR Lungs:  Decreased bs left base, left ct unremarkable Abdomen:  Soft +bs Musculoskeletal:  intact Skin:  Warm and dry  LABS:  CBC  Recent Labs Lab 02/15/14 0259 02/16/14 0220 02/17/14 0330  WBC 10.1 11.1* 10.3  HGB 13.8 13.4 14.2  HCT 44.1 42.4 45.0  PLT 219 222 243   Coag's  Recent Labs Lab 02/12/14 1302  APTT 26  INR 1.11   BMET  Recent Labs Lab 02/15/14 0259 02/16/14 0220 02/17/14 0330  NA 139 137 137  K 4.2 3.6* 4.6  CL 100 98 99  CO2 26 28 27   BUN 13 7 16   CREATININE 1.04 0.86 0.99  GLUCOSE 114* 118* 151*   Electrolytes  Recent Labs Lab 02/15/14 0259 02/16/14 0220 02/17/14 0330  CALCIUM 8.3*  8.3* 8.5  MG  --   --  1.9  PHOS  --   --  2.8   Sepsis Markers No results for input(s): LATICACIDVEN, PROCALCITON, O2SATVEN in the last 168 hours. ABG  Recent Labs Lab 02/12/14 1303 02/14/14 2025  PHART 7.439 7.389  PCO2ART 40.1 51.3*  PO2ART 56.9* 83.0   Liver Enzymes  Recent Labs Lab 02/12/14 1302 02/16/14 0220  AST 24 23  ALT 16 13  ALKPHOS 87 71  BILITOT 0.6 0.5  ALBUMIN 3.3* 2.4*   Cardiac Enzymes No results for input(s): TROPONINI, PROBNP in the last 168 hours. Glucose No results for input(s): GLUCAP in the last 168 hours.  Imaging Dg Chest Port 1 View  02/16/2014   CLINICAL DATA:  Followup interstitial lung disease. Respiratory distress. Left chest tube removal.  EXAM: PORTABLE CHEST - 1 VIEW  COMPARISON:  02/16/2014 at 10:11 a.m.  FINDINGS: Lung volumes are lower on the current study than on prior exam, but allowing for this, there has been no change in the patchy areas airspace and coarse interstitial type opacities. This may all reflect chronic interstitial fibrosis. Superimposed pneumonia is possible.  No pleural effusion or pneumothorax.  Cardiac silhouette is mildly enlarged.  Left chest tube has been removed since the prior study. Tiny pneumothorax is suggested  along the lateral aspect of the left upper lobe.  IMPRESSION: Status post left chest tube removal. Tiny left pneumothorax is now evident.  No other change from the prior study.   Electronically Signed   By: Amie Portlandavid  Ormond M.D.   On: 02/16/2014 17:15   Dg Chest Port 1 View  02/16/2014   CLINICAL DATA:  J84.9 (ICD-10-CM) - ILD (interstitial lung disease),LEFT CHEST TUBE POST BIOPSY,PTX  EXAM: PORTABLE CHEST - 1 VIEW  COMPARISON:  02/16/2014  FINDINGS: No pneumothorax. Left chest tube is stable. Areas of irregular interstitial and airspace lung opacity are also without change.  IMPRESSION: 1. No pneumothorax. Stable left chest tube. Stable lung opacities. No change from the previous day's study.    Electronically Signed   By: Amie Portlandavid  Ormond M.D.   On: 02/16/2014 10:39   Dg Chest Port 1 View  02/16/2014   CLINICAL DATA:  Interstitial lung disease ; left check chest tube status post lung biopsy  EXAM: PORTABLE CHEST - 1 VIEW  COMPARISON:  February 15, 2014 portable chest  FINDINGS: The minimal left apical pneumothorax is not evident today. The left chest tube tip is unchanged in position lying over the posterior lateral aspect of the left seventh rib. The pulmonary interstitial markings remain increased bilaterally and are unchanged allowing for differences in radiographic technique. The left heart border and left hemidiaphragm remain largely obscured. The central pulmonary vascularity is prominent.  IMPRESSION: Interval resolution of the tiny left apical pneumothorax. Stable appearance of the bilaterally increased pulmonary interstitial markings consistent with known interstitial lung disease.   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   F/U on pathology.  CARDIOVASCULAR CVL none A:  Hx of PAF on dilt, flecainide Bradycardia  P:  Consider decreasing antiarrythmic drug doses but will defer to primary, BP stable.  RENAL A:   No acute issue P:   Monitor Replace electrolytes as indicated Lasix 40 mg IV q8 x2 doses BMET in AM.  GASTROINTESTINAL A:   MO GERD P:   PPI Heart healthy diet  HEMATOLOGIC A:  No acute issue P:  Monitor  INFECTIOUS A:   No acute issue P:   Monitor  ENDOCRINE A:  On steroids now P:   Monitor Do not stop  abruptly   NEUROLOGIC A:  No acute issue P:   RASS goal: 1 Intact  FAMILY  - Updates: Patient updated bedside.  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  Continue on steroids for now, titrate O2 down.  May transfer to SDU.  Alyson ReedyWesam G. Yacoub, M.D. [facility] Pulmonary/Critical Care Medicine. Pager: [phone]. After hours pager: [phone].

## 2014-02-17 NOTE — Progress Notes (Signed)
Report called to 3S.  VS WNL upon transfer.  Pt to be transferred in wheelchair.

## 2014-02-18 DIAGNOSIS — Z6841 Body Mass Index (BMI) 40.0 and over, adult: Secondary | ICD-10-CM

## 2014-02-18 DIAGNOSIS — J849 Interstitial pulmonary disease, unspecified: Secondary | ICD-10-CM

## 2014-02-18 DIAGNOSIS — J9602 Acute respiratory failure with hypercapnia: Secondary | ICD-10-CM

## 2014-02-18 DIAGNOSIS — R05 Cough: Secondary | ICD-10-CM

## 2014-02-18 DIAGNOSIS — I481 Persistent atrial fibrillation: Secondary | ICD-10-CM

## 2014-02-18 LAB — CBC
HEMATOCRIT: 45.9 % (ref 36.0–46.0)
HEMOGLOBIN: 14.7 g/dL (ref 12.0–15.0)
MCH: 29.3 pg (ref 26.0–34.0)
MCHC: 32 g/dL (ref 30.0–36.0)
MCV: 91.6 fL (ref 78.0–100.0)
Platelets: 314 10*3/uL (ref 150–400)
RBC: 5.01 MIL/uL (ref 3.87–5.11)
RDW: 13.2 % (ref 11.5–15.5)
WBC: 13.1 10*3/uL — AB (ref 4.0–10.5)

## 2014-02-18 LAB — BASIC METABOLIC PANEL
Anion gap: 13 (ref 5–15)
BUN: 30 mg/dL — AB (ref 6–23)
CHLORIDE: 97 meq/L (ref 96–112)
CO2: 27 mEq/L (ref 19–32)
CREATININE: 1.18 mg/dL — AB (ref 0.50–1.10)
Calcium: 8.8 mg/dL (ref 8.4–10.5)
GFR calc Af Amer: 55 mL/min — ABNORMAL LOW (ref >90)
GFR calc non Af Amer: 48 mL/min — ABNORMAL LOW (ref >90)
GLUCOSE: 159 mg/dL — AB (ref 70–99)
Potassium: 5.6 mEq/L — ABNORMAL HIGH (ref 3.7–5.3)
Sodium: 137 mEq/L (ref 137–147)

## 2014-02-18 LAB — TISSUE CULTURE: Culture: NO GROWTH

## 2014-02-18 LAB — PHOSPHORUS: PHOSPHORUS: 3.6 mg/dL (ref 2.3–4.6)

## 2014-02-18 LAB — MAGNESIUM: Magnesium: 2.3 mg/dL (ref 1.5–2.5)

## 2014-02-18 MED ORDER — FUROSEMIDE 10 MG/ML IJ SOLN
40.0000 mg | Freq: Every day | INTRAMUSCULAR | Status: DC
  Administered 2014-02-18: 40 mg via INTRAVENOUS
  Filled 2014-02-18 (×2): qty 4

## 2014-02-18 MED ORDER — ENOXAPARIN SODIUM 80 MG/0.8ML ~~LOC~~ SOLN
75.0000 mg | SUBCUTANEOUS | Status: DC
  Administered 2014-02-19 – 2014-03-01 (×11): 75 mg via SUBCUTANEOUS
  Filled 2014-02-18 (×11): qty 0.8

## 2014-02-18 MED ORDER — METHYLPREDNISOLONE SODIUM SUCC 40 MG IJ SOLR
40.0000 mg | Freq: Two times a day (BID) | INTRAMUSCULAR | Status: DC
  Administered 2014-02-18 – 2014-02-20 (×4): 40 mg via INTRAVENOUS
  Filled 2014-02-18 (×7): qty 1

## 2014-02-18 MED ORDER — PANTOPRAZOLE SODIUM 40 MG PO TBEC
40.0000 mg | DELAYED_RELEASE_TABLET | Freq: Two times a day (BID) | ORAL | Status: DC
  Administered 2014-02-18 – 2014-03-01 (×23): 40 mg via ORAL
  Filled 2014-02-18 (×23): qty 1

## 2014-02-18 NOTE — Progress Notes (Addendum)
PULMONARY / CRITICAL CARE MEDICINE   Name: Kristin [name] MRN: [id number] DOB: 07/01/1950    ADMISSION DATE:  02/14/2014 CONSULTATION DATE:  11/20  REFERRING MD :  CTS  CHIEF COMPLAINT:  SOB  INITIAL PRESENTATION: 63 yowf morbidly obese   followed by Dr. 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 She underwent left VATS 11/18 and on 11/20 was hypoxic , requiring 75% NRB support. Treated with lasix and PCCM asked to evaluate.  STUDIES:    SIGNIFICANT EVENTS: 11/18 left vats> Frozen c/w ILD > sent out>>> 11/18 L chest tube > out 11/20  11/21  trx to SDU   SUBJECTIVE: N Sitting in chair, says breathing not as comfortable this am, mod cough/ slt beige mucus   VITAL SIGNS: Temp:  [97.8 F (36.6 C)-98.4 F (36.9 C)] 98.3 F (36.8 C) (11/22 0831) Pulse Rate:  [52-72] 61 (11/22 0831) Resp:  [12-26] 17 (11/22 0831) BP: (79-132)/(52-66) 127/64 mmHg (11/22 0831) SpO2:  [85 %-94 %] 89 % (11/22 0831)  FIO2  5lpm   HEMODYNAMICS:   VENTILATOR SETTINGS:   INTAKE / OUTPUT:  Intake/Output Summary (Last 24 hours) at 02/18/14 0944 Last data filed at 02/18/14 0831  Gross per 24 hour  Intake    970 ml  Output   2000 ml  Net  -1030 ml    PHYSICAL EXAMINATION: General:  MOWFNAD@rest  with prominent pseudowheeze on exp better with flutter/ purse lip  Neuro:  Intact HEENT:  No JVD/LAN Cardiovascular:  HSR RRR Lungs:  Decreased bs left base Abdomen:  Soft +bs Musculoskeletal:  intact Skin:  Warm and dry  LABS:  CBC  Recent Labs Lab 02/16/14 0220 02/17/14 0330 02/18/14 0300  WBC 11.1* 10.3 13.1*  HGB 13.4 14.2 14.7  HCT 42.4 45.0 45.9  PLT 222 243 314   Coag's  Recent Labs Lab 02/12/14 1302  APTT 26  INR 1.11   BMET  Recent Labs Lab 02/16/14 0220 02/17/14 0330 02/18/14 0300  NA 137 137 137  K 3.6* 4.6 5.6*  CL 98 99 97  CO2 28 27 27   BUN 7 16 30*  CREATININE 0.86 0.99 1.18*  GLUCOSE  118* 151* 159*   Electrolytes  Recent Labs Lab 02/16/14 0220 02/17/14 0330 02/18/14 0300  CALCIUM 8.3* 8.5 8.8  MG  --  1.9 2.3  PHOS  --  2.8 3.6   Sepsis Markers No results for input(s): LATICACIDVEN, PROCALCITON, O2SATVEN in the last 168 hours. ABG  Recent Labs Lab 02/12/14 1303 02/14/14 2025  PHART 7.439 7.389  PCO2ART 40.1 51.3*  PO2ART 56.9* 83.0   Liver Enzymes  Recent Labs Lab 02/12/14 1302 02/16/14 0220  AST 24 23  ALT 16 13  ALKPHOS 87 71  BILITOT 0.6 0.5  ALBUMIN 3.3* 2.4*   Cardiac Enzymes No results for input(s): TROPONINI, PROBNP in the last 168 hours. Glucose No results for input(s): GLUCAP in the last 168 hours.  Imaging Dg Chest Port 1 View  02/17/2014   CLINICAL DATA:  Respiratory failure with hypoxia.  EXAM: PORTABLE CHEST - 1 VIEW  COMPARISON:  02/16/2014  FINDINGS: Cardiac silhouette remains enlarged, unchanged. Lungs remain hypoinflated. Coarse interstitial and patchy airspace opacities in both lungs do not appear significantly changed. No large pleural effusion is identified. No definite pneumothorax is identified.  IMPRESSION: No definite pneumothorax. Unchanged, diffuse bilateral interstitial and patchy airspace opacities.   Electronically Signed   By: Sebastian AcheAllen  Grady  On: 02/17/2014 07:25   Lab Results  Component Value Date   ESRSEDRATE 17 01/08/2014      ASSESSMENT / PLAN:  PULMONARY OETT none A ILD post left VATS 11/18 OSA Hypoxia Left VATS  Suspected volume overload P:   O2 as needed, titrate for sat of 88-92% Diuresis as below Pulmonary toilet Use nebulized bronchodilators Decreased solumedrol to 40 mg q 12 11/22   F/U on pathology.  CARDIOVASCULAR CVL none A:  Hx of PAF on dilt, flecainide Bradycardia  P:  Consider decreasing antiarrythmic drug doses but will defer to primary, BP stable. Check bnp 11/23   RENAL - mild hyperkalemia 11/22  A:   No acute issue P:   Monitor Replace electrolytes as  indicated Lasix 40 mg IV q8 x2 doses given 11/21  D/c'd maxzide 11/22 due to high K  > repeat 11/23     GASTROINTESTINAL A:   MO GERD P:   PPI bid ac, esp since coughing on steroids  Heart healthy diet  HEMATOLOGIC A:  No acute issue P:  Monitor  INFECTIOUS A:   No acute issue P:   Monitor  ENDOCRINE A:  On steroids now P:   Monitor Do not stop abruptly   NEUROLOGIC A:  No acute issue P:    Intact  FAMILY  - Updates: Patient updated bedside/ husband also   - Inter-disciplinary family meet or Palliative Care meeting due by:  day 7   Sandrea HughsMichael Sherah Lund, MD Pulmonary and Critical Care Medicine [facility] Cell [phone] After 5:30 PM or weekends, call [phone]

## 2014-02-18 NOTE — Progress Notes (Addendum)
TCTS DAILY ICU PROGRESS NOTE                   [address]            [place] [id number]          [phone]   4 Days Post-Op Procedure(s) (LRB): VIDEO ASSISTED THORACOSCOPY (Left) LUNG BIOPSY (Left)  Total Length of Stay:  LOS: 4 days   Subjective: +SOB, feels poorly  Objective: Vital signs in last 24 hours: Temp:  [97.8 F (36.6 C)-98.4 F (36.9 C)] 98.3 F (36.8 C) (11/22 0831) Pulse Rate:  [52-72] 61 (11/22 0831) Cardiac Rhythm:  [-] Normal sinus rhythm (11/22 0745) Resp:  [12-26] 17 (11/22 0831) BP: (79-132)/(52-66) 127/64 mmHg (11/22 0831) SpO2:  [85 %-94 %] 89 % (11/22 0831)  There were no vitals filed for this visit.  Weight change:    Hemodynamic parameters for last 24 hours:    Intake/Output from previous day: 11/21 0701 - 11/22 0700 In: 1090 [P.O.:720; I.V.:370] Out: 1905 [Urine:1905]  Intake/Output this shift: Total I/O In: 20 [I.V.:20] Out: 100 [Urine:100]  Current Meds: Scheduled Meds: . acetaminophen  1,000 mg Oral 4 times per day   Or  . acetaminophen (TYLENOL) oral liquid 160 mg/5 mL  1,000 mg Oral 4 times per day  . aspirin  325 mg Oral Daily  . bisacodyl  10 mg Oral Daily  . budesonide  0.25 mg Nebulization 4 times per day  . diltiazem  60 mg Oral BID  . enoxaparin (LOVENOX) injection  40 mg Subcutaneous Q24H  . flecainide  150 mg Oral BID  . levalbuterol  0.63 mg Nebulization 4 times per day  . loratadine  10 mg Oral Daily  . meloxicam  7.5 mg Oral Daily  . methylPREDNISolone (SOLU-MEDROL) injection  40 mg Intravenous Q12H  . montelukast  10 mg Oral QHS  . pantoprazole  40 mg Oral BID AC  . prednisoLONE acetate  1 drop Both Eyes QHS  . rosuvastatin  10 mg Oral Daily  . senna-docusate  1 tablet Oral QHS  . sertraline  75 mg Oral Daily  . triamterene-hydrochlorothiazide  0.5 tablet Oral Daily   Continuous Infusions: . dextrose 5 % and 0.9% NaCl 10 mL/hr at 02/17/14 0800   PRN Meds:.Place/Maintain arterial  line **AND** sodium chloride, fluticasone, levalbuterol, oxyCODONE, potassium chloride, traMADol  General appearance: alert, cooperative, fatigued and no distress Heart: regular rate and rhythm Lungs: dim in bases, no wheeze/ronchi Abdomen: benign Extremities: mild lower ext edema Wound: ok  Lab Results: CBC: Recent Labs  02/17/14 0330 02/18/14 0300  WBC 10.3 13.1*  HGB 14.2 14.7  HCT 45.0 45.9  PLT 243 314   BMET:  Recent Labs  02/17/14 0330 02/18/14 0300  NA 137 137  K 4.6 5.6*  CL 99 97  CO2 27 27  GLUCOSE 151* 159*  BUN 16 30*  CREATININE 0.99 1.18*  CALCIUM 8.5 8.8    PT/INR: No results for input(s): LABPROT, INR in the last 72 hours. Radiology: Dg Chest Port 1 View  02/17/2014   CLINICAL DATA:  Respiratory failure with hypoxia.  EXAM: PORTABLE CHEST - 1 VIEW  COMPARISON:  02/16/2014  FINDINGS: Cardiac silhouette remains enlarged, unchanged. Lungs remain hypoinflated. Coarse interstitial and patchy airspace opacities in both lungs do not appear significantly changed. No large pleural effusion is identified. No definite pneumothorax is identified.  IMPRESSION: No definite pneumothorax. Unchanged, diffuse bilateral interstitial and patchy airspace opacities.  Electronically Signed   By: Sebastian AcheAllen  Grady   On: 02/17/2014 07:25   Dg Chest Port 1 View  02/16/2014   CLINICAL DATA:  Followup interstitial lung disease. Respiratory distress. Left chest tube removal.  EXAM: PORTABLE CHEST - 1 VIEW  COMPARISON:  02/16/2014 at 10:11 a.m.  FINDINGS: Lung volumes are lower on the current study than on prior exam, but allowing for this, there has been no change in the patchy areas airspace and coarse interstitial type opacities. This may all reflect chronic interstitial fibrosis. Superimposed pneumonia is possible.  No pleural effusion or pneumothorax.  Cardiac silhouette is mildly enlarged.  Left chest tube has been removed since the prior study. Tiny pneumothorax is suggested along the  lateral aspect of the left upper lobe.  IMPRESSION: Status post left chest tube removal. Tiny left pneumothorax is now evident.  No other change from the prior study.   Electronically Signed   By: Amie Portlandavid  Ormond M.D.   On: 02/16/2014 17:15     Assessment/Plan: S/P Procedure(s) (LRB): VIDEO ASSISTED THORACOSCOPY (Left) LUNG BIOPSY (Left)  1 cont aggressive pulm medicine management, titrate O2 down as able 2 mobilize as able     [name],[name] 02/18/2014 10:28 AM cxr reviewed pulm fibrosis- no effusion Home  on O2 when O2 demands improve Stop cardizem -hR slow but maintainig NSR  patient examined and medical record reviewed,agree with above note. Kristin [name] 02/18/2014

## 2014-02-19 ENCOUNTER — Telehealth: Payer: Self-pay | Admitting: Pulmonary Disease

## 2014-02-19 ENCOUNTER — Encounter (HOSPITAL_COMMUNITY): Payer: Self-pay | Admitting: Physician Assistant

## 2014-02-19 DIAGNOSIS — T50905A Adverse effect of unspecified drugs, medicaments and biological substances, initial encounter: Secondary | ICD-10-CM

## 2014-02-19 DIAGNOSIS — J9691 Respiratory failure, unspecified with hypoxia: Secondary | ICD-10-CM | POA: Insufficient documentation

## 2014-02-19 DIAGNOSIS — R001 Bradycardia, unspecified: Secondary | ICD-10-CM | POA: Insufficient documentation

## 2014-02-19 DIAGNOSIS — I480 Paroxysmal atrial fibrillation: Secondary | ICD-10-CM

## 2014-02-19 DIAGNOSIS — J9601 Acute respiratory failure with hypoxia: Secondary | ICD-10-CM

## 2014-02-19 LAB — GLUCOSE, CAPILLARY
GLUCOSE-CAPILLARY: 142 mg/dL — AB (ref 70–99)
GLUCOSE-CAPILLARY: 131 mg/dL — AB (ref 70–99)
GLUCOSE-CAPILLARY: 147 mg/dL — AB (ref 70–99)

## 2014-02-19 LAB — BASIC METABOLIC PANEL
Anion gap: 13 (ref 5–15)
BUN: 44 mg/dL — ABNORMAL HIGH (ref 6–23)
CO2: 27 mEq/L (ref 19–32)
Calcium: 9 mg/dL (ref 8.4–10.5)
Chloride: 97 mEq/L (ref 96–112)
Creatinine, Ser: 1.33 mg/dL — ABNORMAL HIGH (ref 0.50–1.10)
GFR calc non Af Amer: 41 mL/min — ABNORMAL LOW (ref >90)
GFR, EST AFRICAN AMERICAN: 48 mL/min — AB (ref >90)
GLUCOSE: 179 mg/dL — AB (ref 70–99)
POTASSIUM: 4.5 meq/L (ref 3.7–5.3)
Sodium: 137 mEq/L (ref 137–147)

## 2014-02-19 LAB — PRO B NATRIURETIC PEPTIDE: PRO B NATRI PEPTIDE: 3278 pg/mL — AB (ref 0–125)

## 2014-02-19 MED ORDER — FUROSEMIDE 10 MG/ML IJ SOLN
40.0000 mg | Freq: Two times a day (BID) | INTRAMUSCULAR | Status: DC
  Administered 2014-02-19: 40 mg via INTRAVENOUS
  Filled 2014-02-19: qty 4

## 2014-02-19 MED ORDER — DILTIAZEM HCL 30 MG PO TABS
30.0000 mg | ORAL_TABLET | Freq: Two times a day (BID) | ORAL | Status: DC
  Administered 2014-02-19 – 2014-03-01 (×21): 30 mg via ORAL
  Filled 2014-02-19 (×22): qty 1

## 2014-02-19 MED ORDER — DIGOXIN 125 MCG PO TABS
0.1250 mg | ORAL_TABLET | Freq: Every day | ORAL | Status: DC
  Administered 2014-02-19 – 2014-02-24 (×6): 0.125 mg via ORAL
  Filled 2014-02-19 (×7): qty 1

## 2014-02-19 MED ORDER — FUROSEMIDE 10 MG/ML IJ SOLN
40.0000 mg | Freq: Every day | INTRAMUSCULAR | Status: DC
  Filled 2014-02-19: qty 4

## 2014-02-19 NOTE — Consult Note (Signed)
CARDIOLOGY CONSULT NOTE   Patient ID: Kristin [name] MRN: [id number] DOB/AGE: November 09, 1950 63 y.o.  Admit date: 02/14/2014  Primary Physician   Charm Barges, MD Primary Cardiologist   Dr Gwen Pounds Reason for Consultation   Bradycardia  Kristin [name] is a 63 y.o. female with a history of atrial fibrillation, anticoagulation with ASA only. No history of CAD. She had an echocardiogram and stress test prior to starting flecainide and 2012.  In February of 2015 she had a severe episode of community-acquired pneumonia. After that, she developed interstitial lung disease and has been followed by Dr. Kendrick Fries for this. Her respiratory symptoms have not improved. She was not on home oxygen, but has a home monitor and was aware that her saturations were dropping into the 80s. The decision was made to admit her for a lung biopsy, and she came to the hospital for the procedure on 11/18.  She had a VATS and has been recovering since then, but her respiratory status has remained poor and she has required oxygen at 6 L at rest despite diuresis with IV Lasix, and steroids. During ambulation, she requires O2 by NRB.   She generally maintains SR. However, in the week prior to admission, she had several episodes of palpitations/PAF. The episodes were rapid but short-lived. Additionally, she has a history of bradycardia and her Cardizem was changed from 120 mg twice a day to 60 mg twice a day in an effort to limit this. However, even on the decreased dose, she noticed her heart rate dropping into the 30s at times and would feel very washed out and fatigued.  Past Medical History  Diagnosis Date  . Hypertension   . Hypercholesterolemia   . Fibrocystic breast disease   . Degenerative joint disease   . Atrial fibrillation 2008    seen after knee surgery. in SR on Flecainide since 2012  . GERD (gastroesophageal reflux disease)   . Shortness of breath dyspnea   . Environmental and seasonal  allergies   . Sleep apnea     wears CPAP nightly    Past Surgical History  Procedure Laterality Date  . Tonsillectomy    . Cholecystectomy  2003  . Replacement total knee N/A 2004    Dr Erin Sons  . Replacement total knee  12/08    left  . 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 Video assisted thoracoscopy Left 02/14/2014    Procedure: VIDEO ASSISTED THORACOSCOPY;  Surgeon: Loreli Slot, MD;  Location: [facility] OR;  Service: Thoracic;  Laterality: Left;  . Lung biopsy Left 02/14/2014    Procedure: LUNG BIOPSY;  Surgeon: Loreli Slot, MD;  Location: [facility] OR;  Service: Thoracic;  Laterality: Left;   Allergies  Allergen Reactions  . Iodine Hives  . Lisinopril Cough  . Tetanus Toxoids     Hives, welts   I have reviewed the patient's current medications . acetaminophen  1,000 mg Oral 4 times per day   Or  . acetaminophen (TYLENOL) oral liquid 160 mg/5 mL  1,000 mg Oral 4 times per day  . aspirin  325 mg Oral Daily  . bisacodyl  10 mg Oral Daily  . budesonide  0.25 mg Nebulization 4 times per day  . enoxaparin (LOVENOX) injection  75 mg Subcutaneous Q24H  . flecainide  150 mg Oral BID  . [START ON 02/20/2014] furosemide  40 mg Intravenous Daily  . levalbuterol  0.63 mg Nebulization 4 times per day  . loratadine  10 mg Oral Daily  . meloxicam  7.5 mg Oral Daily  . methylPREDNISolone (SOLU-MEDROL) injection  40 mg Intravenous Q12H  . montelukast  10 mg Oral QHS  . pantoprazole  40 mg Oral BID AC  . prednisoLONE acetate  1 drop Both Eyes QHS  . rosuvastatin  10 mg Oral Daily  . senna-docusate  1 tablet Oral QHS  . sertraline  75 mg Oral Daily  . triamterene-hydrochlorothiazide  0.5 tablet Oral Daily   . dextrose 5 % and 0.9% NaCl 10 mL/hr at 02/17/14 0800   Place/Maintain arterial line **AND** sodium chloride, fluticasone, levalbuterol, oxyCODONE, potassium chloride, traMADol  Prior to Admission medications   Medication Sig Start Date End Date Taking?  Authorizing Provider  aspirin 325 MG EC tablet Take 325 mg by mouth daily.   Yes Historical Provider, MD  cetirizine (ZYRTEC) 10 MG tablet Take 10 mg by mouth daily.   Yes Historical Provider, MD  Cholecalciferol (VITAMIN D) 2000 UNITS tablet Take 2,000 Units by mouth daily.   Yes Historical Provider, MD  CRESTOR 10 MG tablet TAKE 1 TABLET EVERY DAY 03/20/13  Yes Dale [place], MD  diltiazem (CARDIZEM) 120 MG tablet Take 60 mg by mouth 2 (two) times daily.   Yes Historical Provider, MD  flecainide (TAMBOCOR) 150 MG tablet Take 150 mg by mouth 2 (two) times daily.   Yes Historical Provider, MD  fluticasone (FLONASE) 50 MCG/ACT nasal spray Place 2 sprays into the nose daily as needed for allergies.  07/07/12  Yes Dale [place], MD  Fluticasone-Salmeterol (ADVAIR DISKUS) 250-50 MCG/DOSE AEPB Inhale 1 puff into the lungs 2 (two) times daily. 09/25/13  Yes Lupita Leash, MD  meloxicam (MOBIC) 7.5 MG tablet TAKE ONE TABLET DAILY WITH FOOD 01/22/14  Yes Sherlene Shams, MD  montelukast (SINGULAIR) 10 MG tablet TAKE ONE TABLET AT BEDTIME 01/22/14  Yes Sherlene Shams, MD  omeprazole (PRILOSEC OTC) 20 MG tablet Take 20 mg by mouth daily.   Yes Historical Provider, MD  prednisoLONE acetate (PRED FORTE) 1 % ophthalmic suspension Place 1 drop into both eyes at bedtime.   Yes Historical Provider, MD  sertraline (ZOLOFT) 50 MG tablet TAKE ONE AND ONE-HALF TABLETS DAILY 01/22/14  Yes Sherlene Shams, MD  Respiratory Therapy Supplies (FLUTTER) DEVI daily.    Historical Provider, MD  Spacer/Aero-Holding Chambers (AEROCHAMBER MV) inhaler Use as instructed 08/29/13   Lupita Leash, MD  triamterene-hydrochlorothiazide (MAXZIDE-25) 37.5-25 MG per tablet Take 1 tablet by mouth daily. Patient taking differently: Take 0.5 tablets by mouth daily.  07/27/13   Dale Celina, MD     History   Social History  . Marital Status: Married    Spouse Name: N/A    Number of Children: 3  . Years of Education: N/A    Occupational History  . Retired    Social History Main Topics  . Smoking status: Former Smoker -- 1.00 packs/day for 15 years    Types: Cigarettes    Quit date: 03/31/1983  . Smokeless tobacco: Never Used  . Alcohol Use: 0.0 oz/week    0 Not specified per week     Comment: occasional  . Drug Use: No  . Sexual Activity: Not on file   Other Topics Concern  . Not on file   Social History Narrative   Lives with husband (since 68).    Family Status  Relation Status Death Age  . Mother Deceased   . Father Deceased    Family History  Problem Relation Age of Onset  . Heart disease Father 1553    myocardial infarction, CABG  . Diabetes Father   . Pancreatitis Mother     gallstone  . Colon cancer Neg Hx     ROS:  Full 14 point review of systems complete and found to be negative unless listed above.  Physical Exam: Blood pressure 115/61, pulse 59, temperature 98.3 F (36.8 C), temperature source Oral, resp. rate 18, weight 348 lb 12.3 oz (158.2 kg), SpO2 92 %.  General: Well developed, well nourished, female in moderate respiratory distress Head: Eyes PERRLA, No xanthomas.   Normocephalic and atraumatic, oropharynx without edema or exudate. Dentition: Poor Lungs: Bilateral rails, few rhonchi, slight wheeze Heart: HRRR S1 S2, no rub/gallop, no murmur. pulses are 2+ all 4 extrem.   Neck: No carotid bruits. No lymphadenopathy.  JVD not elevated. Abdomen: Bowel sounds present, abdomen soft and non-tender without masses or hernias noted. Msk:  No spine or cva tenderness. No weakness, no joint deformities or effusions. Extremities: No clubbing or cyanosis. No edema.  Neuro: Alert and oriented X 3. No focal deficits noted. Psych:  Good affect, responds appropriately Skin: No rashes or lesions noted.  Labs:   Lab Results  Component Value Date   WBC 13.1* 02/18/2014   HGB 14.7 02/18/2014   HCT 45.9 02/18/2014   MCV 91.6 02/18/2014   PLT 314 02/18/2014    Recent Labs Lab  02/16/14 0220  02/19/14 0341  NA 137  < > 137  K 3.6*  < > 4.5  CL 98  < > 97  CO2 28  < > 27  BUN 7  < > 44*  CREATININE 0.86  < > 1.33*  CALCIUM 8.3*  < > 9.0  PROT 6.4  --   --   BILITOT 0.5  --   --   ALKPHOS 71  --   --   ALT 13  --   --   AST 23  --   --   GLUCOSE 118*  < > 179*  ALBUMIN 2.4*  --   --   < > = values in this interval not displayed. MAGNESIUM  Date Value Ref Range Status  02/18/2014 2.3 1.5 - 2.5 mg/dL Final   PRO B NATRIURETIC PEPTIDE (BNP)  Date/Time Value Ref Range Status  02/19/2014 03:41 AM 3278.0* 0 - 125 pg/mL Final   TSH  Date/Time Value Ref Range Status  01/31/2014 08:52 AM 2.78 0.35 - 4.50 uIU/mL Final   Echo and Nuclear stress test: 2012 Prior to Flecainide Both normal per patient  ECG:  Sinus rhythm, rate 74, no acute ischemic changes  Radiology:  Dg Chest 2 View 02/12/2014   CLINICAL DATA:  Interstitial lung disease, preoperative assessment for VATS and LEFT lung biopsy  EXAM: CHEST  2 VIEW  COMPARISON:  07/11/2013  FINDINGS: Borderline enlargement of cardiac silhouette.  Slightly prominent aortic arch unchanged.  Increased interstitial markings throughout both lungs greatest in the upper lobes, progressive since previous exam and asymmetrically slightly greater on RIGHT.  Soft tissue fullness, cannot exclude RIGHT hilar/AP window adenopathy.  No pleural effusion or pneumothorax.  Bones appear demineralized.  IMPRESSION: Borderline enlargement of cardiac silhouette.  Progressive interstitial lung disease bilaterally, greater in upper lobes and on LEFT.  Soft tissue fullness at the AP window and LEFT hilum unable to exclude adenopathy ; this could be better assessed by CT chest with contrast.   Electronically Signed   By: Ulyses SouthwardMark  Boles  M.D.   On: 02/12/2014 16:15   Dg Chest Port 1 View 02/17/2014   CLINICAL DATA:  Respiratory failure with hypoxia.  EXAM: PORTABLE CHEST - 1 VIEW  COMPARISON:  02/16/2014  FINDINGS: Cardiac silhouette remains  enlarged, unchanged. Lungs remain hypoinflated. Coarse interstitial and patchy airspace opacities in both lungs do not appear significantly changed. No large pleural effusion is identified. No definite pneumothorax is identified.  IMPRESSION: No definite pneumothorax. Unchanged, diffuse bilateral interstitial and patchy airspace opacities.   Electronically Signed   By: Sebastian Ache   On: 02/17/2014 07:25   Dg Chest Port 1 View 02/16/2014   CLINICAL DATA:  Followup interstitial lung disease. Respiratory distress. Left chest tube removal.  EXAM: PORTABLE CHEST - 1 VIEW  COMPARISON:  02/16/2014 at 10:11 a.m.  FINDINGS: Lung volumes are lower on the current study than on prior exam, but allowing for this, there has been no change in the patchy areas airspace and coarse interstitial type opacities. This may all reflect chronic interstitial fibrosis. Superimposed pneumonia is possible.  No pleural effusion or pneumothorax.  Cardiac silhouette is mildly enlarged.  Left chest tube has been removed since the prior study. Tiny pneumothorax is suggested along the lateral aspect of the left upper lobe.  IMPRESSION: Status post left chest tube removal. Tiny left pneumothorax is now evident.  No other change from the prior study.   Electronically Signed   By: Amie Portland M.D.   On: 02/16/2014 17:15   Dg Chest Port 1 View 02/16/2014   CLINICAL DATA:  J84.9 (ICD-10-CM) - ILD (interstitial lung disease),LEFT CHEST TUBE POST BIOPSY,PTX  EXAM: PORTABLE CHEST - 1 VIEW  COMPARISON:  02/16/2014  FINDINGS: No pneumothorax. Left chest tube is stable. Areas of irregular interstitial and airspace lung opacity are also without change.  IMPRESSION: 1. No pneumothorax. Stable left chest tube. Stable lung opacities. No change from the previous day's study.   Electronically Signed   By: Amie Portland M.D.   On: 02/16/2014 10:39   Dg Chest Port 1 View 02/16/2014   CLINICAL DATA:  Interstitial lung disease ; left check chest tube status  post lung biopsy  EXAM: PORTABLE CHEST - 1 VIEW  COMPARISON:  February 15, 2014 portable chest  FINDINGS: The minimal left apical pneumothorax is not evident today. The left chest tube tip is unchanged in position lying over the posterior lateral aspect of the left seventh rib. The pulmonary interstitial markings remain increased bilaterally and are unchanged allowing for differences in radiographic technique. The left heart border and left hemidiaphragm remain largely obscured. The central pulmonary vascularity is prominent.  IMPRESSION: Interval resolution of the tiny left apical pneumothorax. Stable appearance of the bilaterally increased pulmonary interstitial markings consistent with known interstitial lung disease.   Electronically Signed   By: David  [place]   On: 02/16/2014 08:12   Dg Chest Port 1 View 02/15/2014   CLINICAL DATA:  Chest tube placement.  EXAM: PORTABLE CHEST - 1 VIEW  COMPARISON:  February 14, 2014.  FINDINGS: Stable cardiomegaly. Left-sided chest tube does not appear to be changed in position. Minimal left apical pneumothorax is noted. Slightly increased right upper lobe opacity is noted concerning for possible developing pneumonia. Stable left basilar opacity is noted. Stable left upper lobe opacity is noted.  IMPRESSION: Left-sided chest tube is unchanged in position. Minimal left apical pneumothorax is noted. Stable left upper lobe and basilar opacities are noted. Slightly increased right upper lobe opacity is noted concerning for developing pneumonia.  Followup radiographs are recommended.   Electronically Signed   By: Roque LiasJames  Green M.D.   On: 02/15/2014 07:49   Dg Chest Port 1 View 02/14/2014   CLINICAL DATA:  Postop from video-assisted thoracoscopy of the left lung. History of hypertension and atrial fibrillation. Short of breath.  EXAM: PORTABLE CHEST - 1 VIEW  COMPARISON:  02/12/2014  FINDINGS: There is a new anastomosis staple line along the left upper lung. Patchy airspace  opacity is noted in the left lung most evident in the mid and lower lung. There is a new chest tube that projects along the peripheral left mid to lower hemi thorax. Small amount of subcutaneous air is seen superficial to the ribs along the course of the chest tube.  Hazy airspace an ill-defined interstitial opacities in a right upper lung are stable. Mild irregular interstitial opacities are noted in the right lung base, also stable.  No pneumothorax.  IMPRESSION: 1. Status post left lung surgery. No pneumothorax. Increased airspace opacity in the left mid and lower lung when compared to the prior study. Some of this may reflect postprocedure atelectasis. 2. Other areas of lung opacity described previously are stable.   Electronically Signed   By: Amie Portlandavid  Ormond M.D.   On: 02/14/2014 16:36   ASSESSMENT AND PLAN:   The patient was seen today by Dr. Elease HashimotoNahser, the patient evaluated and the data reviewed.  Active Problems:    Atrial fibrillation - none seen since admission. Discussed flecainide with M.D., she is maintaining sinus rhythm at this time so we'll continue the flecainide. Consider giving Cardizem 30 mg twice a day with the second dose mid afternoon +/- Digoxin.    Sinus bradycardia - patient was having symptomatic bradycardia prior to admission and her Cardizem dose was decreased. However, she has continued to have significant bradycardia in the hospital, so the Cardizem was discontinued.  Most of her bradycardia occurs while sleeping, and she has not displayed chronic tropic imcompetence.    Hypertension - well controlled on current medications    Cough - per TCTS/CCM    Morbid obesity with body mass index of 50.0-59.9 in adult - per TCTS    Interstitial pulmonary fibrosis - per TCTS/CCM    Acute respiratory failure with hypercapnia - per TCTS/CCM.   SignedTheodore Demark: Rhonda Barrett, PA-C 02/19/2014 4:47 PM Beeper [phone]   I have seen and examined the patient along with Theodore Demarkhonda Barrett, PA-C.   I have reviewed the chart, notes and new data. I agree with PA's note.  Key new complaints: fatigue during the day related to bradycardia, tachypnea at rest, worsens with exercise Key examination changes: HR is particularly low at night; no overt CHF clinically Key new findings / data: labs suggests she may be slightly hypovolemic, not in CHF;   PLAN: Flecainide has worked moderately well for her, but she does need an AV node blocking agent for breakthrough events and risk of atrial flutter with 1:1 AV conduction. Would place on a very low dose of diltiazem (30 mg bid immediate release and add a low dose of digoxin.  Alternative antiarrhythmics are considered, but have drawbacks due to her co-morbidities. Ultimately, she may need a dual chamber permanent pacemaker to allow continued use of antiarrhythmics without symptomatic bradycardia. One hesitates to recommend this right now, when her pulmonary problems are clearly the dominant issue.  Thurmon FairMihai Kyjuan Gause, MD, [facility]ACC [facility] [phone] 02/19/2014, 6:59 PM

## 2014-02-19 NOTE — Progress Notes (Signed)
       [address]       [place] [id number]             [phone]          5 Days Post-Op Procedure(s) (LRB): VIDEO ASSISTED THORACOSCOPY (Left) LUNG BIOPSY (Left)  Subjective: Feels "winded" after getting up to bedside commode.  Back on NRB mask.   Objective: Vital signs in last 24 hours: Patient Vitals for the past 24 hrs:  BP Temp Temp src Pulse Resp SpO2 Weight  02/19/14 0350 (!) 145/66 mmHg 97.5 F (36.4 C) Oral 60 15 93 % -  02/19/14 0024 122/65 mmHg 98 F (36.7 C) Oral 63 (!) 21 93 % -  02/19/14 0000 - - - 76 (!) 24 93 % -  02/18/14 2029 - - - - - 92 % -  02/18/14 1933 (!) 123/55 mmHg 98 F (36.7 C) Oral 66 (!) 28 91 % -  02/18/14 1739 - - - - - - (!) 348 lb 12.3 oz (158.2 kg)  02/18/14 1640 114/62 mmHg 97.5 F (36.4 C) Oral 64 17 (!) 89 % -  02/18/14 1627 - - - - - 94 % -  02/18/14 1315 122/61 mmHg 97.8 F (36.6 C) Oral (!) 57 14 91 % -  02/18/14 0831 127/64 mmHg 98.3 F (36.8 C) Oral 61 17 (!) 89 % -   Current Weight  02/18/14 348 lb 12.3 oz (158.2 kg)     Intake/Output from previous day: 11/22 0701 - 11/23 0700 In: 120 [I.V.:120] Out: 950 [Urine:950]    PHYSICAL EXAM:  Heart: RRR Lungs: Few crackles L base with rare exp wheeze bilaterally Wound: Clean and dry    Lab Results: CBC: Recent Labs  02/17/14 0330 02/18/14 0300  WBC 10.3 13.1*  HGB 14.2 14.7  HCT 45.0 45.9  PLT 243 314   BMET:  Recent Labs  02/18/14 0300 02/19/14 0341  NA 137 137  K 5.6* 4.5  CL 97 97  CO2 27 27  GLUCOSE 159* 179*  BUN 30* 44*  CREATININE 1.18* 1.33*  CALCIUM 8.8 9.0    PT/INR: No results for input(s): LABPROT, INR in the last 72 hours.  ProBNP= 3278   Assessment/Plan: S/P Procedure(s) (LRB): VIDEO ASSISTED THORACOSCOPY (Left) LUNG BIOPSY (Left) Pulm- still with high O2 requirements.  Pulm/CCM following.  Continue pulm toilet/IS/FV/steroids/nebs.  BNP elevated this am, UOP so-so after IV lasix yesterday. Will continue  diuresis today and watch volume status. CV- HR stable, BPs mildly elevated. Continue current care. Mobilize as tolerated.   Wean O2 as able. Cr up slightly today, will follow.   LOS: 5 days    Kristin [name] 02/19/2014

## 2014-02-19 NOTE — Progress Notes (Signed)
PULMONARY / CRITICAL CARE MEDICINE   Name: Kristin [name] MRN: [id number] DOB: 09/30/1950    ADMISSION DATE:  02/14/2014 CONSULTATION DATE:  11/20  REFERRING MD :  CTS  CHIEF COMPLAINT:  SOB  INITIAL PRESENTATION: 63 yowf morbidly obese   followed by Dr. 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 She underwent left VATS 11/18 and on 11/20 was hypoxic , requiring NRB . Treated with lasix and PCCM asked to evaluate.  STUDIES:    SIGNIFICANT EVENTS: 11/18 left vats> Frozen c/w ILD > sent out>>> 11/18 L chest tube > out 11/20  11/21  trx to SDU   SUBJECTIVE: No acute events overnight.  More dyspneic this AM after ambulation to bedside commode, requiring NRB intermittently.   VITAL SIGNS: Temp:  [97.5 F (36.4 C)-98.4 F (36.9 C)] 98.4 F (36.9 C) (11/23 0700) Pulse Rate:  [57-76] 64 (11/23 0735) Resp:  [14-28] 28 (11/23 0735) BP: (114-145)/(55-74) 142/74 mmHg (11/23 0735) SpO2:  [89 %-95 %] 95 % (11/23 0815) Weight:  [158.2 kg (348 lb 12.3 oz)] 158.2 kg (348 lb 12.3 oz) (11/22 1739)   HEMODYNAMICS:   VENTILATOR SETTINGS:   INTAKE / OUTPUT:  Intake/Output Summary (Last 24 hours) at 02/19/14 1002 Last data filed at 02/19/14 0900  Gross per 24 hour  Intake    130 ml  Output    850 ml  Net   -720 ml    PHYSICAL EXAMINATION: General:  Morbidly obese female, sitting in recliner, in NAD. Neuro:  Intact, no focal deficits. HEENT:  No JVD/LAN Cardiovascular:  HSR RRR Lungs:  Decreased bs left base, few rhonchi. Abdomen:  Soft +bs Musculoskeletal:  intact Skin:  Warm and dry  LABS:  CBC  Recent Labs Lab 02/16/14 0220 02/17/14 0330 02/18/14 0300  WBC 11.1* 10.3 13.1*  HGB 13.4 14.2 14.7  HCT 42.4 45.0 45.9  PLT 222 243 314   Coag's  Recent Labs Lab 02/12/14 1302  APTT 26  INR 1.11   BMET  Recent Labs Lab 02/17/14 0330 02/18/14 0300 02/19/14 0341  NA 137 137 137  K 4.6 5.6*  4.5  CL 99 97 97  CO2 27 27 27   BUN 16 30* 44*  CREATININE 0.99 1.18* 1.33*  GLUCOSE 151* 159* 179*   Electrolytes  Recent Labs Lab 02/17/14 0330 02/18/14 0300 02/19/14 0341  CALCIUM 8.5 8.8 9.0  MG 1.9 2.3  --   PHOS 2.8 3.6  --    Sepsis Markers No results for input(s): LATICACIDVEN, PROCALCITON, O2SATVEN in the last 168 hours. ABG  Recent Labs Lab 02/12/14 1303 02/14/14 2025  PHART 7.439 7.389  PCO2ART 40.1 51.3*  PO2ART 56.9* 83.0   Liver Enzymes  Recent Labs Lab 02/12/14 1302 02/16/14 0220  AST 24 23  ALT 16 13  ALKPHOS 87 71  BILITOT 0.6 0.5  ALBUMIN 3.3* 2.4*   Cardiac Enzymes  Recent Labs Lab 02/19/14 0341  PROBNP 3278.0*   Glucose No results for input(s): GLUCAP in the last 168 hours.  Imaging No results found. Lab Results  Component Value Date   ESRSEDRATE 17 01/08/2014      ASSESSMENT / PLAN:  PULMONARY A: Hypoxia ILD post left VATS 11/18 OSA Suspected volume overload P:   O2 as needed, titrate for sat of 88-92% Use nebulized bronchodilators Continue solumedrol 40 mg q 12 (decreased 11/22) CPAP qhs   Pulmonary toilet Diuresis per primary team F/U on pathology  CARDIOVASCULAR  A:  Hx of PAF on dilt, flecainide Bradycardia Elevated BNP P:  Consider decreasing antiarrythmic drug doses but will defer to primary, BP stable. Diuresis per primary team.  RENAL A:   No acute issue P:   Replace electrolytes as indicated Monitor K closely on Maxzide (held 11/22 for hyperkalemia)  GASTROINTESTINAL A:   MO GERD P:   PPI bid ac, esp since coughing on steroids  Heart healthy diet  HEMATOLOGIC A:  No acute issue P:  Monitor  INFECTIOUS A:   Leukocytosis - steroids, nothing to suggest infection P:   Monitor clinically.  ENDOCRINE A:  On steroids now P:   Monitor glucose, SSI for > 180 Do not stop abruptly   NEUROLOGIC A:  No acute issue P:   Intact  FAMILY  - Updates: Patient updated bedside/  husband also   - Inter-disciplinary family meet or Palliative Care meeting due by:  day 7   Rahul Celine Mansesai, PA - [facility] Pgr: [phone]  or [phone]  ATTENDING NOTE: I have personally reviewed patient's available data, including medical history, events of note, physical examination and test results as part of my evaluation. I have discussed with resident/NP and other careteam providers such as pharmacist, RN and RRT & co-ordinated with consultants. In addition, I personally evaluated patient and elicited key history of interstitial lung disease status post surgical biopsy and OSA with postop hypoxia, exam findings of decreased breath sounds on left bibasal crackles, no edema & labs showing worsening creatinine with diuretics.  Started on empiric steroids while awaiting biopsy results, back off on Lasix due to rising creatinine Rest per NP/medical resident whose note is outlined above and that I agree with and edited in full.    Oretha MilchALVA,Armelia Penton V. MD 02/19/2014, 10:07 AM

## 2014-02-19 NOTE — Progress Notes (Signed)
Utilization review complete. Liahna Brickner RN CCM Case Mgmt phone [phone] 

## 2014-02-19 NOTE — Telephone Encounter (Signed)
Spoke with pt, she just wanted to make sure that BQ knew that she was hospitalized this past week. Nothing more needed at this time.

## 2014-02-20 ENCOUNTER — Inpatient Hospital Stay (HOSPITAL_COMMUNITY): Payer: BC Managed Care – PPO

## 2014-02-20 DIAGNOSIS — J939 Pneumothorax, unspecified: Secondary | ICD-10-CM

## 2014-02-20 LAB — GLUCOSE, CAPILLARY
GLUCOSE-CAPILLARY: 111 mg/dL — AB (ref 70–99)
Glucose-Capillary: 152 mg/dL — ABNORMAL HIGH (ref 70–99)
Glucose-Capillary: 167 mg/dL — ABNORMAL HIGH (ref 70–99)
Glucose-Capillary: 144 mg/dL — ABNORMAL HIGH (ref 70–99)

## 2014-02-20 LAB — BASIC METABOLIC PANEL
Anion gap: 19 — ABNORMAL HIGH (ref 5–15)
BUN: 50 mg/dL — AB (ref 6–23)
CALCIUM: 9 mg/dL (ref 8.4–10.5)
CO2: 22 meq/L (ref 19–32)
CREATININE: 1.16 mg/dL — AB (ref 0.50–1.10)
Chloride: 96 mEq/L (ref 96–112)
GFR calc Af Amer: 56 mL/min — ABNORMAL LOW (ref >90)
GFR, EST NON AFRICAN AMERICAN: 49 mL/min — AB (ref >90)
GLUCOSE: 126 mg/dL — AB (ref 70–99)
Potassium: 5.1 mEq/L (ref 3.7–5.3)
SODIUM: 137 meq/L (ref 137–147)

## 2014-02-20 MED ORDER — FUROSEMIDE 10 MG/ML IJ SOLN
40.0000 mg | Freq: Two times a day (BID) | INTRAMUSCULAR | Status: DC
  Administered 2014-02-20 – 2014-02-21 (×4): 40 mg via INTRAVENOUS
  Filled 2014-02-20 (×4): qty 4

## 2014-02-20 MED ORDER — METHYLPREDNISOLONE SODIUM SUCC 125 MG IJ SOLR
125.0000 mg | Freq: Three times a day (TID) | INTRAMUSCULAR | Status: AC
  Administered 2014-02-20 – 2014-02-22 (×6): 125 mg via INTRAVENOUS
  Filled 2014-02-20 (×7): qty 2

## 2014-02-20 MED ORDER — INSULIN ASPART 100 UNIT/ML ~~LOC~~ SOLN
0.0000 [IU] | Freq: Three times a day (TID) | SUBCUTANEOUS | Status: DC
  Administered 2014-02-20 – 2014-02-21 (×2): 4 [IU] via SUBCUTANEOUS
  Administered 2014-02-21 – 2014-02-22 (×2): 3 [IU] via SUBCUTANEOUS
  Administered 2014-02-22: 4 [IU] via SUBCUTANEOUS
  Administered 2014-02-23: 3 [IU] via SUBCUTANEOUS
  Administered 2014-02-23: 4 [IU] via SUBCUTANEOUS
  Administered 2014-02-23 – 2014-02-24 (×2): 3 [IU] via SUBCUTANEOUS
  Administered 2014-02-24 – 2014-02-25 (×4): 4 [IU] via SUBCUTANEOUS
  Administered 2014-02-25: 3 [IU] via SUBCUTANEOUS
  Administered 2014-02-26 (×2): 7 [IU] via SUBCUTANEOUS
  Administered 2014-02-26: 3 [IU] via SUBCUTANEOUS
  Administered 2014-02-27 – 2014-02-28 (×2): 4 [IU] via SUBCUTANEOUS
  Administered 2014-02-28 – 2014-03-01 (×2): 3 [IU] via SUBCUTANEOUS
  Administered 2014-03-01: 7 [IU] via SUBCUTANEOUS

## 2014-02-20 MED ORDER — BUDESONIDE 0.5 MG/2ML IN SUSP
0.5000 mg | Freq: Two times a day (BID) | RESPIRATORY_TRACT | Status: DC
  Administered 2014-02-20 – 2014-03-01 (×18): 0.5 mg via RESPIRATORY_TRACT
  Filled 2014-02-20 (×2): qty 2
  Filled 2014-02-20: qty 4
  Filled 2014-02-20 (×14): qty 2
  Filled 2014-02-20: qty 4
  Filled 2014-02-20 (×4): qty 2

## 2014-02-20 NOTE — Plan of Care (Signed)
Problem: Phase I Progression Outcomes Goal: Pain controlled with appropriate interventions Outcome: Completed/Met Date Met:  02/20/14 Pain receiving adequate relief from pain current pain medication.  Problem: Phase II Progression Outcomes Goal: Incision intact & without signs/symptoms of infection Outcome: Progressing Goal: Tolerating diet Outcome: Completed/Met Date Met:  02/20/14 Patient not having any issues with diet. Tolerating PO intake.

## 2014-02-20 NOTE — Progress Notes (Signed)
PULMONARY / CRITICAL CARE MEDICINE   Name: Kristin [name] MRN: [id number] DOB: 08/22/1950    ADMISSION DATE:  02/14/2014 CONSULTATION DATE:  11/20  REFERRING MD :  CTS  CHIEF COMPLAINT:  SOB  INITIAL PRESENTATION: 63 yowf morbidly obese   followed by Dr. 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 She underwent left VATS 11/18 and on 11/20 was hypoxic , requiring NRB . Treated with lasix and PCCM asked to evaluate.  STUDIES:  11/18 surgical lung biopsy- cellular UIP pattern   SIGNIFICANT EVENTS: 11/18 left vats> Frozen c/w ILD > sent out>>> 11/18 L chest tube > out 11/20  11/21  trx to SDU   SUBJECTIVE: Remains on 5 L nasal cannula with sats ranging from 89-92% Denies cough or chest pain   VITAL SIGNS: Temp:  [97.6 F (36.4 C)-98.7 F (37.1 C)] 98.1 F (36.7 C) (11/24 1451) Pulse Rate:  [52-78] 65 (11/24 1056) Resp:  [16-27] 16 (11/24 1056) BP: (135-154)/(63-85) 138/69 mmHg (11/24 1056) SpO2:  [83 %-96 %] 92 % (11/24 1107) Weight:  [151.1 kg (333 lb 1.8 oz)] 151.1 kg (333 lb 1.8 oz) (11/24 0600)   HEMODYNAMICS:   VENTILATOR SETTINGS:   INTAKE / OUTPUT:  Intake/Output Summary (Last 24 hours) at 02/20/14 1536 Last data filed at 02/20/14 1300  Gross per 24 hour  Intake    760 ml  Output   1750 ml  Net   -990 ml    PHYSICAL EXAMINATION: General:  Morbidly obese female, sitting in recliner, in NAD. Neuro:  Intact, no focal deficits. HEENT:  No JVD/LAN Cardiovascular:  HSR RRR Lungs:  Decreased bs left base, no rhonchi bilateral scattered crackles, Abdomen:  Soft +bs Musculoskeletal:  intact Skin:  Warm and dry  LABS:  CBC  Recent Labs Lab 02/16/14 0220 02/17/14 0330 02/18/14 0300  WBC 11.1* 10.3 13.1*  HGB 13.4 14.2 14.7  HCT 42.4 45.0 45.9  PLT 222 243 314   Coag's No results for input(s): APTT, INR in the last 168 hours. BMET  Recent Labs Lab 02/18/14 0300 02/19/14 0341  02/20/14 0311  NA 137 137 137  K 5.6* 4.5 5.1  CL 97 97 96  CO2 27 27 22   BUN 30* 44* 50*  CREATININE 1.18* 1.33* 1.16*  GLUCOSE 159* 179* 126*   Electrolytes  Recent Labs Lab 02/17/14 0330 02/18/14 0300 02/19/14 0341 02/20/14 0311  CALCIUM 8.5 8.8 9.0 9.0  MG 1.9 2.3  --   --   PHOS 2.8 3.6  --   --    Sepsis Markers No results for input(s): LATICACIDVEN, PROCALCITON, O2SATVEN in the last 168 hours. ABG  Recent Labs Lab 02/14/14 2025  PHART 7.389  PCO2ART 51.3*  PO2ART 83.0   Liver Enzymes  Recent Labs Lab 02/16/14 0220  AST 23  ALT 13  ALKPHOS 71  BILITOT 0.5  ALBUMIN 2.4*   Cardiac Enzymes  Recent Labs Lab 02/19/14 0341  PROBNP 3278.0*   Glucose  Recent Labs Lab 02/19/14 1218 02/19/14 1735 02/19/14 2142 02/20/14 0730 02/20/14 1059  GLUCAP 142* 131* 147* 111* 152*    Imaging No results found. 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   CARDIOVASCULAR A:  Hx of PAF on dilt, flecainide Bradycardia Elevated BNP P:  Consider decreasing antiarrythmic drug doses but will defer tocardiology  Diuresis per primary team.  RENAL A:  AKi , related to diuresis  P:   Replace electrolytes as indicated Monitor Kon diuretics   GASTROINTESTINAL A:   MO GERD P:   PPI bid ac, esp since coughing on steroids  Heart healthy diet  HEMATOLOGIC A:  No acute issue P:  Monitor  INFECTIOUS A:   Leukocytosis - steroids, nothing to suggest infection P:   Monitor clinically.  ENDOCRINE A:  On steroids now P:   Monitor  glucose, SSI for > 180 Do not stop abruptly   NEUROLOGIC A:  No acute issue P:   Intact  FAMILY  - Updates: Patient and daughter updated bedside 11/24    Summary- biopsy shows UIP pattern. Unfortunately, this is poor prognosis for her since this indicates idiopathic pulmonary fibrosis. She does not have any evidence of collagen-vascular disease, ANA was negative prior. Given cellularity on biopsy, we'll give her high-dose steroids into 24-48 hours. Left pneumothorax will be followed by serial x-rays and may require chest tube-defer to TCTss   [facility],Jarman Litton V. MD 02/20/2014, 3:36 PM

## 2014-02-20 NOTE — Progress Notes (Signed)
Placed patient on CPAP with 6L 02 bleed in.  Patient is tolerating well at this time.

## 2014-02-20 NOTE — Progress Notes (Signed)
Utilization review completed.  

## 2014-02-20 NOTE — Progress Notes (Signed)
Patient Name: Kristin [name] Date of Encounter: 02/20/2014  Primary Cardiologist Dr Gwen PoundsKowalski in [place]  Active Problems:   Atrial fibrillation   Hypertension   Cough   Morbid obesity with body mass index of 50.0-59.9 in adult   Interstitial pulmonary fibrosis   Acute respiratory failure with hypercapnia   Drug-induced bradycardia   Respiratory failure with hypoxia    SUBJECTIVE  Continue to have SOB, no CP  CURRENT MEDS . aspirin  325 mg Oral Daily  . bisacodyl  10 mg Oral Daily  . budesonide  0.25 mg Nebulization 4 times per day  . digoxin  0.125 mg Oral Daily  . diltiazem  30 mg Oral BID  . enoxaparin (LOVENOX) injection  75 mg Subcutaneous Q24H  . flecainide  150 mg Oral BID  . furosemide  40 mg Intravenous BID  . levalbuterol  0.63 mg Nebulization 4 times per day  . loratadine  10 mg Oral Daily  . meloxicam  7.5 mg Oral Daily  . methylPREDNISolone (SOLU-MEDROL) injection  40 mg Intravenous Q12H  . montelukast  10 mg Oral QHS  . pantoprazole  40 mg Oral BID AC  . prednisoLONE acetate  1 drop Both Eyes QHS  . rosuvastatin  10 mg Oral Daily  . senna-docusate  1 tablet Oral QHS  . sertraline  75 mg Oral Daily  . triamterene-hydrochlorothiazide  0.5 tablet Oral Daily    OBJECTIVE  Filed Vitals:   02/20/14 0315 02/20/14 0600 02/20/14 0700 02/20/14 1107  BP: 150/85  154/63   Pulse: 74 66    Temp: 97.6 F (36.4 C)  98.1 F (36.7 C) 98.5 F (36.9 C)  TempSrc: Oral  Oral Oral  Resp: 24 27    Height:      Weight:  333 lb 1.8 oz (151.1 kg)    SpO2: 96% 94%  92%    Intake/Output Summary (Last 24 hours) at 02/20/14 1131 Last data filed at 02/20/14 1100  Gross per 24 hour  Intake    210 ml  Output   1350 ml  Net  -1140 ml   Filed Weights   02/18/14 1739 02/20/14 0600  Weight: 348 lb 12.3 oz (158.2 kg) 333 lb 1.8 oz (151.1 kg)    PHYSICAL EXAM  General: Pleasant, NAD. Neuro: Alert and oriented X 3. Moves all extremities spontaneously. Psych:  Normal affect. HEENT:  Normal  Neck: Supple without bruits or JVD. Lungs:  Resp regular and unlabored, L basilar rale Heart: RRR no s3, s4, or murmurs. Abdomen: Soft, non-tender, non-distended, BS + x 4.  Extremities: No clubbing, cyanosis. DP/PT/Radials 2+ and equal bilaterally. 1+ edema in LE  Accessory Clinical Findings  CBC  Recent Labs  02/18/14 0300  WBC 13.1*  HGB 14.7  HCT 45.9  MCV 91.6  PLT 314   Basic Metabolic Panel  Recent Labs  02/18/14 0300 02/19/14 0341 02/20/14 0311  NA 137 137 137  K 5.6* 4.5 5.1  CL 97 97 96  CO2 27 27 22   GLUCOSE 159* 179* 126*  BUN 30* 44* 50*  CREATININE 1.18* 1.33* 1.16*  CALCIUM 8.8 9.0 9.0  MG 2.3  --   --   PHOS 3.6  --   --     TELE NSR with HR 40-80s. No a-fib    ECG  No new EKG   Radiology/Studies  Dg Chest 2 View  02/12/2014   CLINICAL DATA:  Interstitial lung disease, preoperative assessment for VATS and LEFT lung biopsy  EXAM: CHEST  2 VIEW  COMPARISON:  07/11/2013  FINDINGS: Borderline enlargement of cardiac silhouette.  Slightly prominent aortic arch unchanged.  Increased interstitial markings throughout both lungs greatest in the upper lobes, progressive since previous exam and asymmetrically slightly greater on RIGHT.  Soft tissue fullness, cannot exclude RIGHT hilar/AP window adenopathy.  No pleural effusion or pneumothorax.  Bones appear demineralized.  IMPRESSION: Borderline enlargement of cardiac silhouette.  Progressive interstitial lung disease bilaterally, greater in upper lobes and on LEFT.  Soft tissue fullness at the AP window and LEFT hilum unable to exclude adenopathy ; this could be better assessed by CT chest with contrast.   Electronically Signed   By: Ulyses SouthwardMark  Boles M.D.   On: 02/12/2014 16:15   Dg Chest Port 1 View  02/20/2014   CLINICAL DATA:  Interstitial lung disease.  EXAM: PORTABLE CHEST - 1 VIEW  COMPARISON:  02/17/2014, 02/16/2014, 02/15/2014.  FINDINGS: Mediastinum hilar structures are  stable. Stable cardiomegaly. Interval progression of bilateral pulmonary infiltrates. Surgical sutures left upper lung. Recurrent left apical pneumothorax. No acute bony abnormality.  IMPRESSION: 1. Recurrent left apical pneumothorax. Critical Value/emergent results were called by telephone at the time of interpretation on 02/20/2014 at 7:55 am to nurse Seward GraterMaggie ,who verbally acknowledged these results. 2. Progressive bilateral pulmonary infiltrates. 3. Persistent cardiomegaly.   Electronically Signed   By: Maisie Fushomas  Register   On: 02/20/2014 07:57   Dg Chest Port 1 View  02/17/2014   CLINICAL DATA:  Respiratory failure with hypoxia.  EXAM: PORTABLE CHEST - 1 VIEW  COMPARISON:  02/16/2014  FINDINGS: Cardiac silhouette remains enlarged, unchanged. Lungs remain hypoinflated. Coarse interstitial and patchy airspace opacities in both lungs do not appear significantly changed. No large pleural effusion is identified. No definite pneumothorax is identified.  IMPRESSION: No definite pneumothorax. Unchanged, diffuse bilateral interstitial and patchy airspace opacities.   Electronically Signed   By: Sebastian AcheAllen  Grady   On: 02/17/2014 07:25   Dg Chest Port 1 View  02/16/2014   CLINICAL DATA:  Followup interstitial lung disease. Respiratory distress. Left chest tube removal.  EXAM: PORTABLE CHEST - 1 VIEW  COMPARISON:  02/16/2014 at 10:11 a.m.  FINDINGS: Lung volumes are lower on the current study than on prior exam, but allowing for this, there has been no change in the patchy areas airspace and coarse interstitial type opacities. This may all reflect chronic interstitial fibrosis. Superimposed pneumonia is possible.  No pleural effusion or pneumothorax.  Cardiac silhouette is mildly enlarged.  Left chest tube has been removed since the prior study. Tiny pneumothorax is suggested along the lateral aspect of the left upper lobe.  IMPRESSION: Status post left chest tube removal. Tiny left pneumothorax is now evident.  No other  change from the prior study.   Electronically Signed   By: Amie Portlandavid  Ormond M.D.   On: 02/16/2014 17:15   Dg Chest Port 1 View  02/16/2014   CLINICAL DATA:  J84.9 (ICD-10-CM) - ILD (interstitial lung disease),LEFT CHEST TUBE POST BIOPSY,PTX  EXAM: PORTABLE CHEST - 1 VIEW  COMPARISON:  02/16/2014  FINDINGS: No pneumothorax. Left chest tube is stable. Areas of irregular interstitial and airspace lung opacity are also without change.  IMPRESSION: 1. No pneumothorax. Stable left chest tube. Stable lung opacities. No change from the previous day's study.   Electronically Signed   By: Amie Portlandavid  Ormond M.D.   On: 02/16/2014 10:39   Dg Chest Port 1 View  02/16/2014   CLINICAL DATA:  Interstitial lung disease ; left  check chest tube status post lung biopsy  EXAM: PORTABLE CHEST - 1 VIEW  COMPARISON:  February 15, 2014 portable chest  FINDINGS: The minimal left apical pneumothorax is not evident today. The left chest tube tip is unchanged in position lying over the posterior lateral aspect of the left seventh rib. The pulmonary interstitial markings remain increased bilaterally and are unchanged allowing for differences in radiographic technique. The left heart border and left hemidiaphragm remain largely obscured. The central pulmonary vascularity is prominent.  IMPRESSION: Interval resolution of the tiny left apical pneumothorax. Stable appearance of the bilaterally increased pulmonary interstitial markings consistent with known interstitial lung disease.   Electronically Signed   By: David  Sw[place]and   On: 02/16/2014 08:12   Dg Chest Port 1 View  02/15/2014   CLINICAL DATA:  Chest tube placement.  EXAM: PORTABLE CHEST - 1 VIEW  COMPARISON:  February 14, 2014.  FINDINGS: Stable cardiomegaly. Left-sided chest tube does not appear to be changed in position. Minimal left apical pneumothorax is noted. Slightly increased right upper lobe opacity is noted concerning for possible developing pneumonia. Stable left basilar  opacity is noted. Stable left upper lobe opacity is noted.  IMPRESSION: Left-sided chest tube is unchanged in position. Minimal left apical pneumothorax is noted. Stable left upper lobe and basilar opacities are noted. Slightly increased right upper lobe opacity is noted concerning for developing pneumonia. Followup radiographs are recommended.   Electronically Signed   By: Roque Lias M.D.   On: 02/15/2014 07:49   Dg Chest Port 1 View  02/14/2014   CLINICAL DATA:  Postop from video-assisted thoracoscopy of the left lung. History of hypertension and atrial fibrillation. Short of breath.  EXAM: PORTABLE CHEST - 1 VIEW  COMPARISON:  02/12/2014  FINDINGS: There is a new anastomosis staple line along the left upper lung. Patchy airspace opacity is noted in the left lung most evident in the mid and lower lung. There is a new chest tube that projects along the peripheral left mid to lower hemi thorax. Small amount of subcutaneous air is seen superficial to the ribs along the course of the chest tube.  Hazy airspace an ill-defined interstitial opacities in a right upper lung are stable. Mild irregular interstitial opacities are noted in the right lung base, also stable.  No pneumothorax.  IMPRESSION: 1. Status post left lung surgery. No pneumothorax. Increased airspace opacity in the left mid and lower lung when compared to the prior study. Some of this may reflect postprocedure atelectasis. 2. Other areas of lung opacity described previously are stable.   Electronically Signed   By: Amie Portland M.D.   On: 02/14/2014 16:36    ASSESSMENT AND PLAN  1. Sinus bradycardia  - diltiazem decreased to 30mg  BID with addition of digoxin. Continue flecainide  - bradycardia improved. No need for pacemaker  2. A-fib  - currently in NSR on fleicainide  3. Acute CHF, presumed diastolic   - bilateral interstitial edema on CXR  - Continue IV diuresis. Good urine output  4. HTN 5. Morbid obesity 6. Interstitial pulm  fibrosis 7. Acute resp failure with hypercapnia  - s/p VATS with lung biopsy 8. L apical pneumothorax, continue to monitor   Signed, Amedeo Plenty Pager: [phone]   Attending Note:   The patient was seen and examined.  Agree with assessment and plan as noted above.  Changes made to the above note as needed.  Pt has maintained NSR. HR and BP are normal. Continue current  meds.  No additional recs. She should follow up with Dr. Gwen Pounds in [place].  We will sign off. Call for questions   Alvia Grove., MD, [facility] 02/20/2014, 1:21 PM [address]  [address] Office [phone] Pager [phone]

## 2014-02-20 NOTE — Progress Notes (Addendum)
       [address]       [place] [id number]             [phone]          6 Days Post-Op Procedure(s) (LRB): VIDEO ASSISTED THORACOSCOPY (Left) LUNG BIOPSY (Left)  Subjective: OOB in chair.  Breathing was a little rough overnight but feels better this am. Presently sats 92% on 6L, but desats requiring a NRB mask when ambulating.  Walked twice yesterday.    Objective: Vital signs in last 24 hours: Patient Vitals for the past 24 hrs:  BP Temp Temp src Pulse Resp SpO2 Height Weight  02/20/14 0600 - - - - - - - (!) 333 lb 1.8 oz (151.1 kg)  02/20/14 0315 (!) 150/85 mmHg 97.6 F (36.4 C) Oral 74 (!) 24 96 % - -  02/20/14 0115 - - - (!) 52 (!) 23 90 % - -  02/20/14 0055 - - - (!) 57 (!) 26 (!) 83 % - -  02/20/14 0005 - - - 67 (!) 22 94 % - -  02/19/14 2304 (!) 145/77 mmHg 98 F (36.7 C) Oral 78 18 92 % 5' 5.5" (1.664 m) -  02/19/14 2110 - - - - - 93 % - -  02/19/14 1930 135/65 mmHg 98.7 F (37.1 C) Oral 63 18 93 % - -  02/19/14 1430 - - - - - 92 % - -  02/19/14 1234 - 98.3 F (36.8 C) Oral - - - - -  02/19/14 1212 115/61 mmHg - - (!) 59 18 90 % - -  02/19/14 0815 - - - - - 95 % - -   Current Weight  02/20/14 333 lb 1.8 oz (151.1 kg)     Intake/Output from previous day: 11/23 0701 - 11/24 0700 In: 220 [I.V.:220] Out: 1350 [Urine:1350]  CBGs 131-147-126   PHYSICAL EXAM:  Heart: RRR, brady in mid-50s Lungs: Exp wheezes bilaterally, few basilar crackles Wound: Dressed and dry   Lab Results: CBC: Recent Labs  02/18/14 0300  WBC 13.1*  HGB 14.7  HCT 45.9  PLT 314   BMET:  Recent Labs  02/19/14 0341 02/20/14 0311  NA 137 137  K 4.5 5.1  CL 97 96  CO2 27 22  GLUCOSE 179* 126*  BUN 44* 50*  CREATININE 1.33* 1.16*  CALCIUM 9.0 9.0    PT/INR: No results for input(s): LABPROT, INR in the last 72 hours.    Assessment/Plan: S/P Procedure(s) (LRB): VIDEO ASSISTED THORACOSCOPY (Left) LUNG BIOPSY (Left)  Pulm- continue pulm  toilet/IS/FV/steroids/nebs.  UOP 1350 ml after IV Lasix yesterday.  Today's CXR just taken, not in Epic yet- will follow up. Pulm/CCM following.  CV- still with episodes of bradycardia into 50s.  Cardiology following and meds adjusted yesterday. BPs still a little elevated.  Continue ambulation.  Renal- Cr trending back down.  Continue to monitor.   LOS: 6 days    COLLINS,GINA H 02/20/2014  Patient seen and examined Agree with above Minimal progress Will give lasix BID today She has a small pneumo on chest x-ray today. Asymptomatic- will recheck this afternoon, may end up needing chest tube Path still pending

## 2014-02-21 ENCOUNTER — Encounter (HOSPITAL_COMMUNITY): Payer: Self-pay

## 2014-02-21 ENCOUNTER — Encounter: Payer: Self-pay | Admitting: Pulmonary Disease

## 2014-02-21 ENCOUNTER — Inpatient Hospital Stay (HOSPITAL_COMMUNITY): Payer: BC Managed Care – PPO

## 2014-02-21 DIAGNOSIS — J9381 Chronic pneumothorax: Secondary | ICD-10-CM

## 2014-02-21 LAB — GLUCOSE, CAPILLARY
GLUCOSE-CAPILLARY: 134 mg/dL — AB (ref 70–99)
GLUCOSE-CAPILLARY: 158 mg/dL — AB (ref 70–99)
Glucose-Capillary: 159 mg/dL — ABNORMAL HIGH (ref 70–99)
Glucose-Capillary: 116 mg/dL — ABNORMAL HIGH (ref 70–99)

## 2014-02-21 LAB — BASIC METABOLIC PANEL
Anion gap: 11 (ref 5–15)
BUN: 49 mg/dL — AB (ref 6–23)
CO2: 36 mEq/L — ABNORMAL HIGH (ref 19–32)
CREATININE: 1.13 mg/dL — AB (ref 0.50–1.10)
Calcium: 9.2 mg/dL (ref 8.4–10.5)
Chloride: 93 mEq/L — ABNORMAL LOW (ref 96–112)
GFR calc non Af Amer: 50 mL/min — ABNORMAL LOW (ref >90)
GFR, EST AFRICAN AMERICAN: 58 mL/min — AB (ref >90)
Glucose, Bld: 142 mg/dL — ABNORMAL HIGH (ref 70–99)
Potassium: 4.1 mEq/L (ref 3.7–5.3)
Sodium: 140 mEq/L (ref 137–147)

## 2014-02-21 LAB — CBC
HEMATOCRIT: 48.3 % — AB (ref 36.0–46.0)
Hemoglobin: 15.2 g/dL — ABNORMAL HIGH (ref 12.0–15.0)
MCH: 28 pg (ref 26.0–34.0)
MCHC: 31.5 g/dL (ref 30.0–36.0)
MCV: 89.1 fL (ref 78.0–100.0)
PLATELETS: 321 10*3/uL (ref 150–400)
RBC: 5.42 MIL/uL — ABNORMAL HIGH (ref 3.87–5.11)
RDW: 13.2 % (ref 11.5–15.5)
WBC: 9 10*3/uL (ref 4.0–10.5)

## 2014-02-21 LAB — PRO B NATRIURETIC PEPTIDE: Pro B Natriuretic peptide (BNP): 2017 pg/mL — ABNORMAL HIGH (ref 0–125)

## 2014-02-21 MED ORDER — SODIUM CHLORIDE 0.9 % IJ SOLN
10.0000 mL | Freq: Two times a day (BID) | INTRAMUSCULAR | Status: DC
  Administered 2014-02-21: 10 mL
  Administered 2014-02-22 (×2): 20 mL
  Administered 2014-02-23 – 2014-02-24 (×3): 10 mL
  Administered 2014-02-24 – 2014-02-25 (×2): 20 mL
  Administered 2014-02-25 – 2014-02-27 (×4): 10 mL
  Administered 2014-02-27 – 2014-02-28 (×3): 20 mL
  Administered 2014-03-01: 10 mL
  Filled 2014-02-21: qty 20

## 2014-02-21 MED ORDER — SODIUM CHLORIDE 0.9 % IJ SOLN
10.0000 mL | INTRAMUSCULAR | Status: DC | PRN
  Filled 2014-02-21: qty 40
  Filled 2014-02-21 (×2): qty 20

## 2014-02-21 MED ORDER — LIDOCAINE HCL (PF) 1 % IJ SOLN
INTRAMUSCULAR | Status: AC
  Filled 2014-02-21: qty 5

## 2014-02-21 MED ORDER — MIDAZOLAM HCL 2 MG/2ML IJ SOLN
INTRAMUSCULAR | Status: AC
  Administered 2014-02-21: 2 mg
  Filled 2014-02-21: qty 4

## 2014-02-21 NOTE — Procedures (Signed)
After informed consent patient given 2 mg of versed IV  28 F chest tube placed anteriorly in left chest using sterile technique.  + rush of air  Tolerated well  Chest tube to - 20 cm suction  Chest x-ray ordered

## 2014-02-21 NOTE — Progress Notes (Addendum)
       [address]       [place] [id number]             [phone]          7 Days Post-Op Procedure(s) (LRB): VIDEO ASSISTED THORACOSCOPY (Left) LUNG BIOPSY (Left)  Subjective: Stable night, no new complaints this am.     Objective: Vital signs in last 24 hours: Patient Vitals for the past 24 hrs:  BP Temp Temp src Pulse Resp SpO2 Weight  02/21/14 0550 - - - - (!) 28 (!) 48 % -  02/21/14 0500 - - - (!) 44 (!) 22 90 % (!) 327 lb (148.326 kg)  02/21/14 0336 (!) 157/69 mmHg 97.7 F (36.5 C) Oral 64 (!) 23 92 % -  02/21/14 0100 - - - - - 90 % -  02/21/14 0031 - - - - - (!) 85 % -  02/21/14 0023 (!) 148/78 mmHg 97.9 F (36.6 C) Oral 61 17 91 % -  02/20/14 2353 - - - - - (!) 89 % -  02/20/14 2347 - - - - - (!) 85 % -  02/20/14 2332 - - - 78 (!) 22 90 % -  02/20/14 2010 - - - - - 91 % -  02/20/14 2006 133/66 mmHg 98.2 F (36.8 C) Oral 63 (!) 28 93 % -  02/20/14 1451 - 98.1 F (36.7 C) Oral - - - -  02/20/14 1107 - 98.5 F (36.9 C) Oral - - 92 % -  02/20/14 1056 138/69 mmHg - - 65 16 (!) 84 % -   Current Weight  02/21/14 327 lb (148.326 kg)     Intake/Output from previous day: 11/24 0701 - 11/25 0700 In: 1500 [P.O.:1320; I.V.:180] Out: 4075 [Urine:4075]    PHYSICAL EXAM:  Heart: RRR, brady in 50s Lungs: Bilateral crackles, no wheezing today Wound: Clean and dry     Lab Results: CBC: Recent Labs  02/21/14 0343  WBC 9.0  HGB 15.2*  HCT 48.3*  PLT 321   BMET:  Recent Labs  02/20/14 0311 02/21/14 0343  NA 137 140  K 5.1 4.1  CL 96 93*  CO2 22 36*  GLUCOSE 126* 142*  BUN 50* 49*  CREATININE 1.16* 1.13*  CALCIUM 9.0 9.2    PT/INR: No results for input(s): LABPROT, INR in the last 72 hours.    Assessment/Plan: S/P Procedure(s) (LRB): VIDEO ASSISTED THORACOSCOPY (Left) LUNG BIOPSY (Left)  Pulm- continue pulm toilet/IS/FV/steroids/nebs. UOP excellent after IV Lasix yesterday- probably will repeat today.   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  Pulmonary following and started Solumedrol.  L ptx was stable on yesterday's repeat CXR. No CXR yet today- ordered and will follow.  CV- still with episodes of bradycardia into 50s. Cardiology has signed off.  BPs still a little elevated- will watch and continue present management.  Continue ambulation.  Renal- Cr back to baseline.   LOS: 7 days    COLLINS,GINA H 02/21/2014  Patient seen and examined. Respiratory status still marginal. On CXR it looks like left pneumo is larger- may just be angles but I think we need to place a chest tube.  I discussed the indications, risks, benefits and alternatives with [name] and her husband. She agrees to proceed.

## 2014-02-21 NOTE — Plan of Care (Signed)
Problem: Phase I Progression Outcomes Goal: O2 sats > or equal to 88% with O2 Outcome: Progressing

## 2014-02-21 NOTE — Progress Notes (Signed)
Patient O2 saturation between 87-92% on 5L [place] in the early evening. Pt was placed on CPAP at bedtime, initially on 6L. Oxygen was increased gradually until patient was receving 10L via CPAP in order to maintain oxygen saturation above 88%. At 0550 patient was taken off CPAP and placed back on 6L [place], O2 saturation dropped to the high 40s and maintained in the 50s for a period of time. Venturi mask was placed on patient initially on 14L/55%.  Patient switched to 6L [place] but desaturated to 82%. Venturi mask was replaced and left at 8L in order to maintain patients O2 saturation at desired level.

## 2014-02-21 NOTE — Progress Notes (Signed)
Chest tube placement

## 2014-02-21 NOTE — Progress Notes (Signed)
PULMONARY / CRITICAL CARE MEDICINE   Name: Kristin [name] MRN: [id number] DOB: 12/18/1950    ADMISSION DATE:  02/14/2014 CONSULTATION DATE:  11/20  REFERRING MD :  CTS  CHIEF COMPLAINT:  SOB  INITIAL PRESENTATION: 63 yowf morbidly obese remote smoker (quit '85, <15 Pyrs)   followed by Dr. 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 She underwent left VATS 11/18 and on 11/20 was hypoxic , requiring NRB . Treated with lasix and PCCM asked to evaluate.  STUDIES:  11/18 surgical lung biopsy- cellular UIP pattern   SIGNIFICANT EVENTS: 11/18 left vats> Frozen c/w ILD > sent out>>> 11/18 L chest tube > out 11/20  11/21  trx to SDU  11/25 left chest tube reinserted >>  SUBJECTIVE: Chst tube reinserted this am Hypoxia improved somewhat-back down to 6L [place] C/o pain   VITAL SIGNS: Temp:  [97.7 F (36.5 C)-98.2 F (36.8 C)] 98.2 F (36.8 C) (11/25 0700) Pulse Rate:  [44-78] 44 (11/25 0500) Resp:  [17-28] 28 (11/25 0550) BP: (133-157)/(66-78) 157/69 mmHg (11/25 0336) SpO2:  [48 %-95 %] 95 % (11/25 0947) FiO2 (%):  [55 %] 55 % (11/25 0618) Weight:  [148.326 kg (327 lb)] 148.326 kg (327 lb) (11/25 0500)   HEMODYNAMICS:   VENTILATOR SETTINGS: Vent Mode:  [-]  FiO2 (%):  [55 %] 55 % INTAKE / OUTPUT:  Intake/Output Summary (Last 24 hours) at 02/21/14 1110 Last data filed at 02/21/14 0800  Gross per 24 hour  Intake   1280 ml  Output   3075 ml  Net  -1795 ml    PHYSICAL EXAMINATION: General:  Morbidly obese female, sitting up in bed Neuro:  Intact, no focal deficits. HEENT:  No JVD/LAN Cardiovascular:  HSR RRR Lungs:  Decreased bs left base, no rhonchi bilateral scattered crackles, air leak + on chest tube Abdomen:  Soft +bs Musculoskeletal:  intact Skin:  Warm and dry  LABS:  CBC  Recent Labs Lab 02/17/14 0330 02/18/14 0300 02/21/14 0343  WBC 10.3 13.1* 9.0  HGB 14.2 14.7 15.2*  HCT 45.0 45.9  48.3*  PLT 243 314 321   Coag's No results for input(s): APTT, INR in the last 168 hours. BMET  Recent Labs Lab 02/19/14 0341 02/20/14 0311 02/21/14 0343  NA 137 137 140  K 4.5 5.1 4.1  CL 97 96 93*  CO2 27 22 36*  BUN 44* 50* 49*  CREATININE 1.33* 1.16* 1.13*  GLUCOSE 179* 126* 142*   Electrolytes  Recent Labs Lab 02/17/14 0330 02/18/14 0300 02/19/14 0341 02/20/14 0311 02/21/14 0343  CALCIUM 8.5 8.8 9.0 9.0 9.2  MG 1.9 2.3  --   --   --   PHOS 2.8 3.6  --   --   --    Sepsis Markers No results for input(s): LATICACIDVEN, PROCALCITON, O2SATVEN in the last 168 hours. ABG  Recent Labs Lab 02/14/14 2025  PHART 7.389  PCO2ART 51.3*  PO2ART 83.0   Liver Enzymes  Recent Labs Lab 02/16/14 0220  AST 23  ALT 13  ALKPHOS 71  BILITOT 0.5  ALBUMIN 2.4*   Cardiac Enzymes  Recent Labs Lab 02/19/14 0341 02/21/14 0343  PROBNP 3278.0* 2017.0*   Glucose  Recent Labs Lab 02/19/14 2142 02/20/14 0730 02/20/14 1059 02/20/14 1640 02/20/14 2130 02/21/14 0754  GLUCAP 147* 111* 152* 167* 144* 159*    Imaging Dg Chest Portable 1 View  02/20/2014   CLINICAL DATA:  "Walking corpse"  syndrome.  EXAM: PORTABLE CHEST - 1 VIEW  COMPARISON:  Same day.  FINDINGS: Stable cardiomegaly. Stable mild left apical pneumothorax. Stable bilateral lung opacities are noted concerning for pneumonia or edema. Mild left pleural effusion is noted.  IMPRESSION: Stable mild left apical pneumothorax. Stable mild left pleural effusion. Stable bilateral lung opacities are noted concerning for pneumonia or edema.   Electronically Signed   By: Roque LiasJames  Green M.D.   On: 02/20/2014 16:03   Dg Chest Port 1 View  02/20/2014   CLINICAL DATA:  Interstitial lung disease.  EXAM: PORTABLE CHEST - 1 VIEW  COMPARISON:  02/17/2014, 02/16/2014, 02/15/2014.  FINDINGS: Mediastinum hilar structures are stable. Stable cardiomegaly. Interval progression of bilateral pulmonary infiltrates. Surgical sutures left  upper lung. Recurrent left apical pneumothorax. No acute bony abnormality.  IMPRESSION: 1. Recurrent left apical pneumothorax. Critical Value/emergent results were called by telephone at the time of interpretation on 02/20/2014 at 7:55 am to nurse Seward GraterMaggie ,who verbally acknowledged these results. 2. Progressive bilateral pulmonary infiltrates. 3. Persistent cardiomegaly.   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  Left apical pneumothorax OSA  P:   O2 as needed, titrate for sat of 88-92% Use nebulized bronchodilators  given cellular nature of UIP , will give high-dose  solumedrol125 every 8 x 6 doses -can dial down 11/26 CPAP qhs   Daily CXR while chestt ube in   CARDIOVASCULAR A:  Hx of PAF on dilt, flecainide Bradycardia Elevated BNP P:  Rate controlled on flecainide,dig &c ardizem, seen by cardiology  Diuresis as permitted by renal fn  RENAL A:  AKi , related to diuresis  P:   Replace electrolytes as indicated Monitor K on diuretics   GASTROINTESTINAL A:   MO GERD P:   PPI bid ac, esp since coughing on steroids  Heart healthy diet  HEMATOLOGIC A:  No acute issue P:  Monitor  INFECTIOUS A:   Leukocytosis - steroids, nothing to suggest infection P:   Monitor clinically.  ENDOCRINE A: Hyperglycemia due to steroids  P:   Monitor glucose, SSI for > 180   NEUROLOGIC A:  No acute issue P:   Intact  FAMILY  - Updates: Patient and daughter updated bedside 11/24    Summary- biopsy shows UIP pattern. Unfortunately, this is poor prognosis for her since this indicates idiopathic pulmonary fibrosis. She does not have any evidence of collagen-vascular disease, ANA was negative prior. Given cellularity on biopsy, we'll give her high-dose steroids  into 24-48 hours. Left pneumothorax - chest tube-defer to TCTS   [facility] HospitalVA,RAKESH V. MD 02/21/2014, 11:10 AM

## 2014-02-21 NOTE — Progress Notes (Signed)
Peripherally Inserted Central Catheter/Midline Placement  The IV Nurse has discussed with the patient and/or persons authorized to consent for the patient, the purpose of this procedure and the potential benefits and risks involved with this procedure.  The benefits include less needle sticks, lab draws from the catheter and patient may be discharged home with the catheter.  Risks include, but not limited to, infection, bleeding, blood clot (thrombus formation), and puncture of an artery; nerve damage and irregular heat beat.  Alternatives to this procedure were also discussed.  PICC/Midline Placement Documentation  PICC / Midline Double Lumen 02/21/14 PICC Right Basilic 50 cm 0 cm (Active)  Indication for Insertion or Continuance of Line Poor Vasculature-patient has had multiple peripheral attempts or PIVs lasting less than 24 hours 02/21/2014  1:00 PM  Exposed Catheter (cm) 0 cm 02/21/2014  1:00 PM  Site Assessment Clean;Dry;Intact 02/21/2014  1:00 PM  Lumen #1 Status Flushed;Saline locked;Blood return noted 02/21/2014  1:00 PM  Lumen #2 Status Flushed;Saline locked;Blood return noted 02/21/2014  1:00 PM  Dressing Type Transparent 02/21/2014  1:00 PM  Dressing Status Clean;Dry;Intact;Antimicrobial disc in place 02/21/2014  1:00 PM  Dressing Change Due 02/28/14 02/21/2014  1:00 PM       Mikhia Dusek, Lajean ManesKerry Loraine 02/21/2014, 1:02 PM

## 2014-02-21 NOTE — Progress Notes (Signed)
Attempted to wean off venti mask dropped to 81% reapplied venti

## 2014-02-22 ENCOUNTER — Inpatient Hospital Stay (HOSPITAL_COMMUNITY): Payer: BC Managed Care – PPO

## 2014-02-22 LAB — GLUCOSE, CAPILLARY
GLUCOSE-CAPILLARY: 151 mg/dL — AB (ref 70–99)
GLUCOSE-CAPILLARY: 142 mg/dL — AB (ref 70–99)
Glucose-Capillary: 120 mg/dL — ABNORMAL HIGH (ref 70–99)
Glucose-Capillary: 232 mg/dL — ABNORMAL HIGH (ref 70–99)

## 2014-02-22 MED ORDER — FUROSEMIDE 40 MG PO TABS
40.0000 mg | ORAL_TABLET | Freq: Two times a day (BID) | ORAL | Status: DC
  Filled 2014-02-22 (×2): qty 1

## 2014-02-22 MED ORDER — METHYLPREDNISOLONE SODIUM SUCC 125 MG IJ SOLR
80.0000 mg | Freq: Four times a day (QID) | INTRAMUSCULAR | Status: DC
  Administered 2014-02-22 – 2014-02-25 (×12): 80 mg via INTRAVENOUS
  Filled 2014-02-22 (×17): qty 1.28

## 2014-02-22 MED ORDER — FUROSEMIDE 40 MG PO TABS
40.0000 mg | ORAL_TABLET | Freq: Two times a day (BID) | ORAL | Status: DC
  Administered 2014-02-22 – 2014-02-23 (×3): 40 mg via ORAL
  Filled 2014-02-22 (×4): qty 1

## 2014-02-22 NOTE — Progress Notes (Signed)
Patient was placed on CPAP of 8cmH2O via nasal pillows with 10 L O2 bled in. Patient is tolerating at this time and vitals are within normal limits. RT will continue to monitor.

## 2014-02-22 NOTE — Progress Notes (Signed)
PULMONARY / CRITICAL CARE MEDICINE   Name: Kristin [name] MRN: [id number] DOB: 08/09/1950    ADMISSION DATE:  02/14/2014 CONSULTATION DATE:  11/20  REFERRING MD :  CTS  CHIEF COMPLAINT:  SOB  INITIAL PRESENTATION: 63 yowf morbidly obese remote smoker (quit '85, <15 Pyrs)   followed by Dr. 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 She underwent left VATS 11/18 and on 11/20 was hypoxic , requiring NRB . Treated with lasix and PCCM asked to evaluate.  STUDIES:  11/18 surgical lung biopsy- cellular UIP pattern   SIGNIFICANT EVENTS: 11/18 left vats> Frozen c/w ILD > sent out>>> 11/18 L chest tube > out 11/20  11/21  trx to SDU  11/25 left chest tube reinserted >>  SUBJECTIVE: Chst tube reinserted this am Hypoxia improved somewhat-back down to 6L [place] C/o pain   VITAL SIGNS: Temp:  [97.7 F (36.5 C)-98.2 F (36.8 C)] 98 F (36.7 C) (11/26 0710) Pulse Rate:  [56-72] 56 (11/26 0710) Resp:  [15-31] 18 (11/26 0710) BP: (119-149)/(65-87) 149/68 mmHg (11/26 0710) SpO2:  [88 %-91 %] 91 % (11/26 0710) Weight:  [147.782 kg (325 lb 12.8 oz)] 147.782 kg (325 lb 12.8 oz) (11/26 0500)   HEMODYNAMICS:   VENTILATOR SETTINGS:   INTAKE / OUTPUT:  Intake/Output Summary (Last 24 hours) at 02/22/14 1129 Last data filed at 02/22/14 0710  Gross per 24 hour  Intake    740 ml  Output   1216 ml  Net   -476 ml    PHYSICAL EXAMINATION: General:  Morbidly obese female, sitting up in bed Neuro:  Intact, no focal deficits. HEENT:  No JVD/LAN Cardiovascular:  HSR RRR Lungs:  Decreased bs left base, no rhonchi bilateral scattered crackles, air leak + on chest tube Abdomen:  Soft +bs Musculoskeletal:  intact Skin:  Warm and dry  LABS:  CBC  Recent Labs Lab 02/17/14 0330 02/18/14 0300 02/21/14 0343  WBC 10.3 13.1* 9.0  HGB 14.2 14.7 15.2*  HCT 45.0 45.9 48.3*  PLT 243 314 321   Coag's No results for input(s):  APTT, INR in the last 168 hours. BMET  Recent Labs Lab 02/19/14 0341 02/20/14 0311 02/21/14 0343  NA 137 137 140  K 4.5 5.1 4.1  CL 97 96 93*  CO2 27 22 36*  BUN 44* 50* 49*  CREATININE 1.33* 1.16* 1.13*  GLUCOSE 179* 126* 142*   Electrolytes  Recent Labs Lab 02/17/14 0330 02/18/14 0300 02/19/14 0341 02/20/14 0311 02/21/14 0343  CALCIUM 8.5 8.8 9.0 9.0 9.2  MG 1.9 2.3  --   --   --   PHOS 2.8 3.6  --   --   --    Sepsis Markers No results for input(s): LATICACIDVEN, PROCALCITON, O2SATVEN in the last 168 hours. ABG No results for input(s): PHART, PCO2ART, PO2ART in the last 168 hours. Liver Enzymes  Recent Labs Lab 02/16/14 0220  AST 23  ALT 13  ALKPHOS 71  BILITOT 0.5  ALBUMIN 2.4*   Cardiac Enzymes  Recent Labs Lab 02/19/14 0341 02/21/14 0343  PROBNP 3278.0* 2017.0*   Glucose  Recent Labs Lab 02/20/14 2130 02/21/14 0754 02/21/14 1330 02/21/14 1635 02/21/14 2143 02/22/14 0730  GLUCAP 144* 159* 116* 134* 158* 120*    Imaging Dg Chest Port 1 View  02/21/2014   CLINICAL DATA:  Left chest tube  EXAM: PORTABLE CHEST - 1 VIEW  COMPARISON:  02/21/2014  FINDINGS: Cardiomediastinal silhouette is stable.  Again noted bilateral perihilar and left lower lobe infiltrates. Left chest tube in place. Postsurgical changes in left upper hemi thorax. Tiny left upper pneumothorax.  IMPRESSION: Bilateral infiltrates again noted. Left chest tube in place. Tiny left upper pneumothorax. Postsurgical changes left upper hemithorax.   Electronically Signed   By: Natasha MeadLiviu  Pop M.D.   On: 02/21/2014 10:43   Dg Chest Port 1 View  02/21/2014   CLINICAL DATA:  Pneumothorax.  EXAM: PORTABLE CHEST - 1 VIEW  COMPARISON:  02/20/2014.  FINDINGS: Mediastinum hilar structures are stable. Stable cardiomegaly. Diffuse bilateral pulmonary infiltrates again noted. Persistent left-sided pneumothorax again noted. Osseous structures are stable P  IMPRESSION: 1. Persistent small left-sided  pneumothorax. 2. Persistent dense bilateral pulmonary infiltrates.  No change. 3. Stable cardiomegaly.   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  ENDOCRINE A: Hyperglycemia due to steroids  P:   Monitor glucose, SSI for > 180   NEUROLOGIC A:  No acute issue P:   Intact  FAMILY  - Updates: Patient and daughter and husband updated bedside 11/26    Summary- while biopsy shows UIP there is organized inflammation in the biopsy that may be amenable to steroids    Shan LevansPatrick Nicanor Mendolia MD 02/22/2014, 11:29 AM

## 2014-02-22 NOTE — Progress Notes (Addendum)
[address]       [place] [id number]             [phone]          8 Days Post-Op Procedure(s) (LRB): VIDEO ASSISTED THORACOSCOPY (Left) LUNG BIOPSY (Left)  Subjective: "I feel like I might have turned the corner." Breathing feels a lot better since CT placed.  Walked yesterday without problem.  Still desats with talking or exertion.   Objective: Vital signs in last 24 hours: Patient Vitals for the past 24 hrs:  BP Temp Temp src Pulse Resp SpO2 Weight  02/22/14 0710 (!) 149/68 mmHg 98 F (36.7 C) Oral (!) 56 18 91 % -  02/22/14 0517 - - - - - (!) 89 % -  02/22/14 0500 - - - - - - (!) 325 lb 12.8 oz (147.782 kg)  02/22/14 0349 (!) 145/66 mmHg 97.7 F (36.5 C) Oral 61 16 91 % -  02/22/14 0000 - - - (!) 59 15 (!) 89 % -  02/21/14 2308 119/85 mmHg 98.2 F (36.8 C) Oral 62 (!) 23 (!) 88 % -  02/21/14 2100 - - - - - 90 % -  02/21/14 2007 - 97.9 F (36.6 C) Oral - - - -  02/21/14 1955 139/69 mmHg - - 64 (!) 27 90 % -  02/21/14 1638 - 97.9 F (36.6 C) Oral - - - -  02/21/14 1633 (!) 144/87 mmHg - - 72 15 (!) 88 % -  02/21/14 1325 132/65 mmHg 98.2 F (36.8 C) Oral 61 (!) 31 91 % -  02/21/14 0947 - - - - - 95 % -  02/21/14 0910 (!) 119/48 mmHg - - (!) 56 20 (!) 86 % -  02/21/14 0905 127/63 mmHg - - 66 (!) 27 (!) 86 % -  02/21/14 0900 (!) 126/48 mmHg - - (!) 57 19 (!) 89 % -  02/21/14 0855 (!) 121/59 mmHg - - 61 (!) 21 (!) 87 % -  02/21/14 0755 - - - 62 (!) 24 (!) 88 % -  02/21/14 0750 (!) 146/74 mmHg 98.2 F (36.8 C) Axillary 65 (!) 28 (!) 81 % -   Current Weight  02/22/14 325 lb 12.8 oz (147.782 kg)     Intake/Output from previous day: 11/25 0701 - 11/26 0700 In: 1030 [P.O.:960; I.V.:70] Out: 2236 [Urine:2200; Chest Tube:36]    PHYSICAL EXAM:  Heart: RRR Lungs: Exp wheezes with few crackles Wound:Clean and dry Chest tube: 1/7 air leak   Lab Results: CBC: Recent Labs  02/21/14 0343  WBC 9.0  HGB 15.2*  HCT 48.3*  PLT 321     BMET:  Recent Labs  02/20/14 0311 02/21/14 0343  NA 137 140  K 5.1 4.1  CL 96 93*  CO2 22 36*  GLUCOSE 126* 142*  BUN 50* 49*  CREATININE 1.16* 1.13*  CALCIUM 9.0 9.2    PT/INR: No results for input(s): LABPROT, INR in the last 72 hours.  CXR: improved, small residual left ptx   Assessment/Plan: S/P Procedure(s) (LRB): VIDEO ASSISTED THORACOSCOPY (Left) LUNG BIOPSY (Left)   Pulm- continue pulm toilet/IS/FV/steroids/nebs. Diuresing well, continue Lasix.  Left pneumothorax- Improved after CT placed.  Small air leak, will leave to suction for now.  Path= UIP. Pulmonary following, IV Solumedrol being titrated.  CV- HR stable, BPs still a little elevated at times. Continue current meds.  Continue ambulation.  Renal- Cr stable.   LOS: 8 days  COLLINS,GINA H 02/22/2014  Patient seen and examined, agree with above She has definitely improved since the tube was placed. She has also continued to diurese so that may be playing a role as well  Keep CT to suction today- will place to water seal tomorrow  Increase ambulation

## 2014-02-23 ENCOUNTER — Telehealth: Payer: Self-pay

## 2014-02-23 ENCOUNTER — Inpatient Hospital Stay (HOSPITAL_COMMUNITY): Payer: BC Managed Care – PPO

## 2014-02-23 LAB — COMPREHENSIVE METABOLIC PANEL
ALT: 17 U/L (ref 0–35)
ANION GAP: 11 (ref 5–15)
AST: 13 U/L (ref 0–37)
Albumin: 2.6 g/dL — ABNORMAL LOW (ref 3.5–5.2)
Alkaline Phosphatase: 67 U/L (ref 39–117)
BILIRUBIN TOTAL: 0.3 mg/dL (ref 0.3–1.2)
BUN: 57 mg/dL — AB (ref 6–23)
CHLORIDE: 92 meq/L — AB (ref 96–112)
CO2: 39 mEq/L — ABNORMAL HIGH (ref 19–32)
CREATININE: 1.28 mg/dL — AB (ref 0.50–1.10)
Calcium: 8.4 mg/dL (ref 8.4–10.5)
GFR, EST AFRICAN AMERICAN: 50 mL/min — AB (ref >90)
GFR, EST NON AFRICAN AMERICAN: 43 mL/min — AB (ref >90)
GLUCOSE: 156 mg/dL — AB (ref 70–99)
Potassium: 3.3 mEq/L — ABNORMAL LOW (ref 3.7–5.3)
Sodium: 142 mEq/L (ref 137–147)
Total Protein: 6.1 g/dL (ref 6.0–8.3)

## 2014-02-23 LAB — GLUCOSE, CAPILLARY
GLUCOSE-CAPILLARY: 131 mg/dL — AB (ref 70–99)
Glucose-Capillary: 137 mg/dL — ABNORMAL HIGH (ref 70–99)
Glucose-Capillary: 151 mg/dL — ABNORMAL HIGH (ref 70–99)
Glucose-Capillary: 142 mg/dL — ABNORMAL HIGH (ref 70–99)

## 2014-02-23 LAB — CBC
HCT: 46.2 % — ABNORMAL HIGH (ref 36.0–46.0)
Hemoglobin: 14.9 g/dL (ref 12.0–15.0)
MCH: 28.5 pg (ref 26.0–34.0)
MCHC: 32.3 g/dL (ref 30.0–36.0)
MCV: 88.3 fL (ref 78.0–100.0)
PLATELETS: 267 10*3/uL (ref 150–400)
RBC: 5.23 MIL/uL — ABNORMAL HIGH (ref 3.87–5.11)
RDW: 13.1 % (ref 11.5–15.5)
WBC: 12.9 10*3/uL — ABNORMAL HIGH (ref 4.0–10.5)

## 2014-02-23 MED ORDER — POTASSIUM CHLORIDE CRYS ER 20 MEQ PO TBCR
40.0000 meq | EXTENDED_RELEASE_TABLET | Freq: Two times a day (BID) | ORAL | Status: AC
  Administered 2014-02-23 (×2): 40 meq via ORAL
  Filled 2014-02-23 (×2): qty 2

## 2014-02-23 MED ORDER — FUROSEMIDE 40 MG PO TABS
60.0000 mg | ORAL_TABLET | Freq: Every day | ORAL | Status: DC
  Administered 2014-02-24 – 2014-02-27 (×4): 60 mg via ORAL
  Filled 2014-02-23 (×5): qty 1

## 2014-02-23 NOTE — Progress Notes (Signed)
PULMONARY / CRITICAL CARE MEDICINE   Name: Kristin [name] MRN: [id number] DOB: 06/06/1950    ADMISSION DATE:  02/14/2014 CONSULTATION DATE:  11/20  REFERRING MD :  CTS  CHIEF COMPLAINT:  SOB  INITIAL PRESENTATION: 63 yowf morbidly obese remote smoker (quit '85, <15 Pyrs)   followed by Dr. 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 She underwent left VATS 11/18 and on 11/20 was hypoxic , requiring NRB . Treated with lasix and PCCM asked to evaluate.  STUDIES:  11/18 surgical lung biopsy- cellular UIP pattern   SIGNIFICANT EVENTS: 11/18 left vats> Frozen c/w ILD > sent out>>> 11/18 L chest tube > out 11/20  11/21  trx to SDU  11/25 left chest tube reinserted >>  SUBJECTIVE:  Improved shortness of breath, has been walking around but O2 saturation drops when she does that   VITAL SIGNS: Temp:  [97.4 F (36.3 C)-98.6 F (37 C)] 97.7 F (36.5 C) (11/27 0700) Pulse Rate:  [52-87] 52 (11/27 0437) Resp:  [17-22] 20 (11/27 0437) BP: (129-154)/(61-69) 142/69 mmHg (11/27 0437) SpO2:  [89 %-94 %] 91 % (11/27 0849) Weight:  [147.9 kg (326 lb 1 oz)] 147.9 kg (326 lb 1 oz) (11/27 0600)   HEMODYNAMICS:   VENTILATOR SETTINGS:   INTAKE / OUTPUT:  Intake/Output Summary (Last 24 hours) at 02/23/14 0851 Last data filed at 02/23/14 0600  Gross per 24 hour  Intake    680 ml  Output   1580 ml  Net   -900 ml    PHYSICAL EXAMINATION: General:  Morbidly obese female, comfortable in bed HEENT: NCAT, EOMi PULM: pleural rub on left, clear on right CV: RRR, no mgr AB: BS+, soft Ext: trace edema Neuro: A&OX4, maew Psyche: pleasant mood  LABS:  CBC  Recent Labs Lab 02/18/14 0300 02/21/14 0343 02/23/14 0530  WBC 13.1* 9.0 12.9*  HGB 14.7 15.2* 14.9  HCT 45.9 48.3* 46.2*  PLT 314 321 267   Coag's No results for input(s): APTT, INR in the last 168 hours. BMET  Recent Labs Lab 02/20/14 0311 02/21/14 0343  02/23/14 0530  NA 137 140 142  K 5.1 4.1 3.3*  CL 96 93* 92*  CO2 22 36* 39*  BUN 50* 49* 57*  CREATININE 1.16* 1.13* 1.28*  GLUCOSE 126* 142* 156*   Electrolytes  Recent Labs Lab 02/17/14 0330 02/18/14 0300  02/20/14 0311 02/21/14 0343 02/23/14 0530  CALCIUM 8.5 8.8  < > 9.0 9.2 8.4  MG 1.9 2.3  --   --   --   --   PHOS 2.8 3.6  --   --   --   --   < > = values in this interval not displayed. Sepsis Markers No results for input(s): LATICACIDVEN, PROCALCITON, O2SATVEN in the last 168 hours. ABG No results for input(s): PHART, PCO2ART, PO2ART in the last 168 hours. Liver Enzymes  Recent Labs Lab 02/23/14 0530  AST 13  ALT 17  ALKPHOS 67  BILITOT 0.3  ALBUMIN 2.6*   Cardiac Enzymes  Recent Labs Lab 02/19/14 0341 02/21/14 0343  PROBNP 3278.0* 2017.0*   Glucose  Recent Labs Lab 02/21/14 2143 02/22/14 0730 02/22/14 1213 02/22/14 1703 02/22/14 2141 02/23/14 0818  GLUCAP 158* 120* 151* 142* 232* 137*    Imaging Dg Chest Port 1 View  02/22/2014   CLINICAL DATA:  Status post VATS, left chest tube, interstitial pneumonia  EXAM: PORTABLE CHEST - 1 VIEW  COMPARISON:  02/21/2014  FINDINGS: Similar pattern of diffuse reticular nodular interstitial opacities throughout both lungs compatible with interstitial pneumonia (UIP). Tiny residual right apical pneumothorax. Stable left chest tube position. Stable cardiomegaly with vascular congestion and basilar atelectasis. No enlarging effusion. Right PICC line has been inserted with the tip at the SVC RA junction level. Film is slightly rotated to the left.  IMPRESSION: Stable interstitial lung disease pattern and tiny residual left apical pneumothorax. No significant interval change.   Electronically Signed   By: Ruel Favorsrevor  Shick M.D.   On: 02/22/2014 09:09   Lab Results  Component Value Date   ESRSEDRATE 17 01/08/2014      ASSESSMENT / PLAN:  PULMONARY A: Acute hypoxic respiratory failure  ILD post left VATS  11/18- UIP pattern on biopsy with monocellular infiltrate suggestive of underlying connective tissue disease Left apical pneumothorax OSA  P:   Will plan antifibrotic therapy with pirfenidone as well as anti-inflammatory therapy with prednisone Continue solumedrol 80mg  IV through 11/30, then change to prednisone 60mg , plan one week at that dose, then decrease to 40mg  daily until seen in pulmonary clinic Ambulate as able Incentive spirometry, flutter O2 for O2 saturation > 90% CT per TCTS   CARDIOVASCULAR A:  Hx of PAF on dilt, flecainide Bradycardia Elevated BNP P:  Rate controlled on flecainide,dig &c ardizem, seen by cardiology  Goal slightly net negative fluid balance Adjust lasix to 60mg  daily  RENAL A:  Hypokalemia from diuresis  P:   Monitor BMET and UOP Replace electrolytes as needed Continue scheduled potassium  GASTROINTESTINAL A:   MO GERD P:   PPI bid ac, esp since coughing on steroids  Heart healthy diet  HEMATOLOGIC A:  No acute issue P:  Monitor  INFECTIOUS A:   Leukocytosis - due to steroids, nothing to suggest infection P:   Monitor clinically.  ENDOCRINE A: Hyperglycemia due to steroids  P:   Monitor glucose, SSI for > 180 Diabetic educator consult  NEUROLOGIC A:  No acute issue P:   Intact  FAMILY  - Updates: Patient and husband updated at bedside at length 11/27    Summary- while biopsy shows UIP there is inflammation in the biopsy that may be amenable to steroids    Max FickleMCQUAID, DOUGLAS MD 02/23/2014, 8:51 AM

## 2014-02-23 NOTE — Progress Notes (Addendum)
       [address]       [place] [id number]             [phone]          9 Days Post-Op Procedure(s) (LRB): VIDEO ASSISTED THORACOSCOPY (Left) LUNG BIOPSY (Left)  Subjective: Stable night, no new complaints.   Objective: Vital signs in last 24 hours: Patient Vitals for the past 24 hrs:  BP Temp Temp src Pulse Resp SpO2 Weight  02/23/14 0600 - - - - - - (!) 326 lb 1 oz (147.9 kg)  02/23/14 0437 (!) 142/69 mmHg 97.5 F (36.4 C) Oral (!) 52 20 92 % -  02/23/14 0034 140/64 mmHg 97.4 F (36.3 C) Oral 61 (!) 22 94 % -  02/22/14 2055 - - - 87 - 90 % -  02/22/14 2030 (!) 154/61 mmHg 98.6 F (37 C) Oral 74 19 (!) 89 % -  02/22/14 1700 - 98.2 F (36.8 C) - - - - -  02/22/14 1633 129/67 mmHg - - 62 17 90 % -  02/22/14 1207 138/63 mmHg 97.9 F (36.6 C) Oral (!) 56 20 94 % -   Current Weight  02/23/14 326 lb 1 oz (147.9 kg)     Intake/Output from previous day: 11/26 0701 - 11/27 0700 In: 800 [P.O.:800] Out: 1780 [Urine:1750; Chest Tube:30]  CBGs [id number]   PHYSICAL EXAM:  Heart: RRR, brady in 50s Lungs: Few crackles this am,  Wound: Clean and dry Chest tube: small air leak with cough    Lab Results: CBC: Recent Labs  02/21/14 0343 02/23/14 0530  WBC 9.0 12.9*  HGB 15.2* 14.9  HCT 48.3* 46.2*  PLT 321 267   BMET:  Recent Labs  02/21/14 0343 02/23/14 0530  NA 140 142  K 4.1 3.3*  CL 93* 92*  CO2 36* 39*  GLUCOSE 142* 156*  BUN 49* 57*  CREATININE 1.13* 1.28*  CALCIUM 9.2 8.4    PT/INR: No results for input(s): LABPROT, INR in the last 72 hours.  CXR: stable tiny apical ptx    Assessment/Plan: S/P Procedure(s) (LRB): VIDEO ASSISTED THORACOSCOPY (Left) LUNG BIOPSY (Left)  Left ptx- stable small air leak, CXR stable.  Will place CT to water seal.  Pulm/UIP- Continue steroid taper per CCM, continue diuresis.  Leukocytosis, no fever.  Likely related to steroids.  Will watch.  Hypokalemia- replace K+.  AKI- Cr  up slightly today. Recheck in am.  CV- generally stable on current meds.  Hyperglycemia- probably steroid related.  Continue SSI. Hopefully will not need to start insulin as steroids are being tapered.   LOS: 9 days    COLLINS,GINA H 02/23/2014  Patient seen and examined, agree with above Discussed case with Dr. Kendrick FriesMcQuaid today- his plan is outlined in his note Will try chest tube to water seal today and see if the lung will stay up

## 2014-02-23 NOTE — Progress Notes (Signed)
2128 Pt HR converted to a-fib 90's-110's MD made aware no orders given. 2318 Pt HR converted to NSR with PAC's 80's

## 2014-02-23 NOTE — Telephone Encounter (Signed)
I've began filling out paperwork for this, will leave in look-at for you to sign.

## 2014-02-23 NOTE — Progress Notes (Addendum)
Nutrition Brief Note  RD received consult from DM coordination for DM diet education. Noted pt with no hx of diabetes, but has elevated blood sugars due to steroid use.  Wt Readings from Last 15 Encounters:  02/23/14 326 lb 1 oz (147.9 kg)  02/12/14 329 lb 12.9 oz (149.6 kg)  02/05/14 340 lb (154.223 kg)  01/31/14 336 lb (152.409 kg)  01/29/14 345 lb 3.2 oz (156.582 kg)  01/22/14 337 lb (152.862 kg)  01/08/14 338 lb (153.316 kg)  10/31/13 332 lb 12 oz (150.934 kg)  09/28/13 330 lb (149.687 kg)  08/29/13 333 lb (151.048 kg)  08/08/13 328 lb 12 oz (149.12 kg)  06/13/13 317 lb 4 oz (143.904 kg)  05/24/13 311 lb (141.069 kg)  05/18/13 310 lb 8 oz (140.842 kg)  05/12/13 314 lb (142.429 kg)   Attempted to speak with pt multiple times, but was out of room at times of visits. Provided "Carbohydrate Counting For People With Diabetes" handout from [facility] Care [facility] at bedside table. RD try to will reattempt education at a later date. If pt desires further support for weight loss, recommend outpatient nutrition education.   Body mass index is 53.41 kg/(m^2). Patient meets criteria for extreme obesity, class III based on current BMI.   Current diet order is Heart Healthy/ Carb Modified, patient is consuming approximately 100% of meals at this time. Labs and medications reviewed.   No nutrition interventions warranted at this time. If nutrition issues arise, please consult RD.   Shaka Zech A. Mayford KnifeWilliams, RD, LDN Pager: [phone] After hours Pager: [phone]

## 2014-02-23 NOTE — Progress Notes (Signed)
Inpatient Diabetes Program Recommendations  AACE/ADA: New Consensus Statement on Inpatient Glycemic Control (2013)  Target Ranges:  Prepandial:   less than 140 mg/dL      Peak postprandial:   less than 180 mg/dL (1-2 hours)      Critically ill patients:  140 - 180 mg/dL   Consult received re potential for hyperglycemia while on steroid therapy at home. Pt's glucose on admission prior to the initiation of solumedrol, glucose levels were normal. Even after starting the steroid therapy, pt has only needed small doses of correction insulin. Once transitioned to prednisone and doses tapered down, I doubt that she will need any insulin at all. Would recommend pt stay on the carbohydrate modified diet at home as well as the heart healthy diet. Will have RN's instruct patient on how to watch the ed'l system network videos on carb modified nutrition. Will also order an RD consult to instruct pt with basic skills for carb counting. This pt may well benefit from RD instruction overall regarding need to lose some weight. Inpatient Diabetes Program Recommendations Correction (SSI): Glucose is well controlled using resistant correction tidwc while o Solumedrol injections. Does not appear to need even a basal at this time. Would expect that once the steroids are tapered for discharge home, she should not need any  insulin at all.   Thank you, Lenor CoffinAnn Shadrack Brummitt, RN, CNS, Diabetes Coordinator [phone])

## 2014-02-23 NOTE — Progress Notes (Signed)
Placed patient on CPAP for the night via auto-mode with minimum pressure set at 6cm and maximum pressure set at 20cm. Oxygen set at 6lpm. Patient requested not to be awakened for nebulizer treatment at 0200. Will continue to monitor patient

## 2014-02-23 NOTE — Telephone Encounter (Signed)
-----   Message from Lupita Leashouglas B McQuaid, MD sent at 02/23/2014  8:59 AM EST ----- A, We need to apply for esbriet for her Thanks B

## 2014-02-24 ENCOUNTER — Inpatient Hospital Stay (HOSPITAL_COMMUNITY): Payer: BC Managed Care – PPO

## 2014-02-24 LAB — BASIC METABOLIC PANEL
Anion gap: 8 (ref 5–15)
BUN: 54 mg/dL — AB (ref 6–23)
CO2: 37 mEq/L — ABNORMAL HIGH (ref 19–32)
CREATININE: 1.07 mg/dL (ref 0.50–1.10)
Calcium: 8.3 mg/dL — ABNORMAL LOW (ref 8.4–10.5)
Chloride: 96 mEq/L (ref 96–112)
GFR calc non Af Amer: 54 mL/min — ABNORMAL LOW (ref >90)
GFR, EST AFRICAN AMERICAN: 62 mL/min — AB (ref >90)
Glucose, Bld: 150 mg/dL — ABNORMAL HIGH (ref 70–99)
Potassium: 4.1 mEq/L (ref 3.7–5.3)
Sodium: 141 mEq/L (ref 137–147)

## 2014-02-24 LAB — GLUCOSE, CAPILLARY
GLUCOSE-CAPILLARY: 185 mg/dL — AB (ref 70–99)
Glucose-Capillary: 122 mg/dL — ABNORMAL HIGH (ref 70–99)
Glucose-Capillary: 177 mg/dL — ABNORMAL HIGH (ref 70–99)
Glucose-Capillary: 191 mg/dL — ABNORMAL HIGH (ref 70–99)

## 2014-02-24 MED ORDER — LEVALBUTEROL HCL 0.63 MG/3ML IN NEBU
0.6300 mg | INHALATION_SOLUTION | Freq: Four times a day (QID) | RESPIRATORY_TRACT | Status: DC
  Administered 2014-02-24 – 2014-03-01 (×22): 0.63 mg via RESPIRATORY_TRACT
  Filled 2014-02-24 (×30): qty 3

## 2014-02-24 NOTE — Progress Notes (Signed)
PULMONARY / CRITICAL CARE MEDICINE   Name: Kristin [name] MRN: [id number] DOB: 11/19/1950    ADMISSION DATE:  02/14/2014 CONSULTATION DATE:  11/20  REFERRING MD :  CTS  CHIEF COMPLAINT:  SOB  INITIAL PRESENTATION: 63 yowf morbidly obese remote smoker (quit '85, <15 Pyrs)   followed by Dr. 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 She underwent left VATS 11/18 and on 11/20 was hypoxic , requiring NRB . Treated with lasix and PCCM asked to evaluate.  STUDIES:  11/18 surgical lung biopsy- cellular UIP pattern   SIGNIFICANT EVENTS: 11/18 left vats> Frozen c/w ILD > sent out>>> 11/18 L chest tube > out 11/20  11/21  trx to SDU  11/25 left chest tube reinserted >>  SUBJECTIVE:   Chest tube to water seal 11/27 Significant desats with ambulation  VITAL SIGNS: Temp:  [97.5 F (36.4 C)-99 F (37.2 C)] 97.5 F (36.4 C) (11/28 0351) Pulse Rate:  [51-68] 59 (11/28 0351) Resp:  [15-21] 21 (11/28 0351) BP: (120-136)/(56-99) 136/80 mmHg (11/28 0351) SpO2:  [89 %-94 %] 94 % (11/28 0351) Weight:  [153.3 kg (337 lb 15.4 oz)] 153.3 kg (337 lb 15.4 oz) (11/28 0351)   HEMODYNAMICS:   VENTILATOR SETTINGS:   INTAKE / OUTPUT:  Intake/Output Summary (Last 24 hours) at 02/24/14 0707 Last data filed at 02/24/14 0356  Gross per 24 hour  Intake      0 ml  Output   1150 ml  Net  -1150 ml    PHYSICAL EXAMINATION: General:  Morbidly obese female, comfortable in bed HEENT: NCAT, EOMi PULM: pleural rub on left, clear on right CV: RRR, no mgr AB: BS+, soft Ext: trace edema Neuro: A&OX4, maew Psyche: pleasant mood  LABS:  CBC  Recent Labs Lab 02/18/14 0300 02/21/14 0343 02/23/14 0530  WBC 13.1* 9.0 12.9*  HGB 14.7 15.2* 14.9  HCT 45.9 48.3* 46.2*  PLT 314 321 267   Coag's No results for input(s): APTT, INR in the last 168 hours. BMET  Recent Labs Lab 02/21/14 0343 02/23/14 0530 02/24/14 0533  NA 140  142 141  K 4.1 3.3* 4.1  CL 93* 92* 96  CO2 36* 39* 37*  BUN 49* 57* 54*  CREATININE 1.13* 1.28* 1.07  GLUCOSE 142* 156* 150*   Electrolytes  Recent Labs Lab 02/18/14 0300  02/21/14 0343 02/23/14 0530 02/24/14 0533  CALCIUM 8.8  < > 9.2 8.4 8.3*  MG 2.3  --   --   --   --   PHOS 3.6  --   --   --   --   < > = values in this interval not displayed. Sepsis Markers No results for input(s): LATICACIDVEN, PROCALCITON, O2SATVEN in the last 168 hours. ABG No results for input(s): PHART, PCO2ART, PO2ART in the last 168 hours. Liver Enzymes  Recent Labs Lab 02/23/14 0530  AST 13  ALT 17  ALKPHOS 67  BILITOT 0.3  ALBUMIN 2.6*   Cardiac Enzymes  Recent Labs Lab 02/19/14 0341 02/21/14 0343  PROBNP 3278.0* 2017.0*   Glucose  Recent Labs Lab 02/22/14 1703 02/22/14 2141 02/23/14 0818 02/23/14 1228 02/23/14 1654 02/23/14 2136  GLUCAP 142* 232* 137* 131* 151* 142*    Imaging Dg Chest Port 1 View  02/23/2014   CLINICAL DATA:  Pneumothorax.  Cough.  Chest tube leak.  EXAM: PORTABLE CHEST - 1 VIEW  COMPARISON:  02/22/2014, 02/21/2014, 02/20/2014.  Scratch  FINDINGS: Right PICC  line, left chest tube in stable position. Stable cardiomegaly. Stable diffuse pulmonary interstitial changes are noted. These findings consistent with pneumonitis. Underlying chronic interstitial lung disease most likely present . Persistent stable left pneumothorax.  IMPRESSION: 1. Right PICC line and left chest tube in stable position. 2. Stable left apical pneumothorax. 3. Stable interstitial lung disease consistent with pneumonitis. Component of chronic interstitial lung disease most likely present.   Electronically Signed   By: Maisie Fushomas  Register   On: 02/23/2014 08:10   Lab Results  Component Value Date   ESRSEDRATE 17 01/08/2014      ASSESSMENT / PLAN:  PULMONARY A: Acute hypoxic respiratory failure  ILD post left VATS 11/18- UIP pattern on biopsy with monocellular infiltrate suggestive  of underlying connective tissue disease Left apical pneumothorax OSA  P:   Will plan antifibrotic therapy with pirfenidone as well as anti-inflammatory therapy with prednisone Continue solumedrol 80mg  IV through 11/30, then change to prednisone 60mg , plan one week at that dose, then decrease to 40mg  daily until seen in pulmonary clinic Ambulate as able Incentive spirometry, flutter O2 for O2 saturation > 90% CPAP qhs CT per TCTS, hopefully out 11/28. PTX looks about the same on waterseal 11/28   CARDIOVASCULAR A:  Hx of PAF on dilt, flecainide Bradycardia Elevated BNP P:  Rate controlled on flecainide,dig &cardizem, seen by cardiology  Goal slightly net negative fluid balance Continue lasix 60mg  PO daily  RENAL A:  Hypokalemia from diuresis  P:   Monitor BMET and UOP Replace electrolytes as needed Continue scheduled potassium  GASTROINTESTINAL A:   MO GERD P:   PPI bid ac, esp since coughing on steroids  Heart healthy diet  HEMATOLOGIC A:  No acute issue P:  Monitor  INFECTIOUS A:   Leukocytosis - due to steroids, nothing to suggest infection P:   Monitor clinically.  ENDOCRINE A: Hyperglycemia due to steroids  P:   Monitor glucose, SSI for > 180 Diabetic educator consult  NEUROLOGIC A:  No acute issue P:   Intact  FAMILY  - Updates: Patient and husband updated at bedside 11/28  Summary- while biopsy shows UIP there is inflammation in the biopsy that may be amenable to steroids. Course planned. Will likely also be placed on pirfenidone as an outpt  Levy Pupaobert Christine Morton, MD, PhD 02/24/2014, 7:10 AM [facility] [phone] or if no answer [phone]

## 2014-02-24 NOTE — Plan of Care (Signed)
Problem: Phase I Progression Outcomes Goal: Activity tolerated as ordered Outcome: Progressing Goal: If Diabetic, blood sugar < 150 Outcome: Completed/Met Date Met:  02/24/14 Goal: Initial discharge plan identified Outcome: Completed/Met Date Met:  02/24/14  Problem: Phase II Progression Outcomes Goal: Incision intact & without signs/symptoms of infection Outcome: Completed/Met Date Met:  02/24/14 Goal: Weaning O2 for sats > or equal to 88% Outcome: Progressing Goal: Hemodynamically stable Outcome: Completed/Met Date Met:  02/24/14 Goal: Pain controlled Outcome: Progressing Goal: Tolerates progressive ambulation Outcome: Progressing

## 2014-02-24 NOTE — Progress Notes (Addendum)
       [address]       [place] [id number]             [phone]          10 Days Post-Op Procedure(s) (LRB): VIDEO ASSISTED THORACOSCOPY (Left) LUNG BIOPSY (Left)  Subjective: Feels well this am. Had a run of rate controlled AF last night but converted to SR.    Objective: Vital signs in last 24 hours: Patient Vitals for the past 24 hrs:  BP Temp Temp src Pulse Resp SpO2 Weight  02/24/14 0351 136/80 mmHg 97.5 F (36.4 C) Oral (!) 59 (!) 21 94 % (!) 337 lb 15.4 oz (153.3 kg)  02/23/14 2347 (!) 121/99 mmHg 99 F (37.2 C) Oral 68 17 90 % -  02/23/14 2020 - - - - - 91 % -  02/23/14 1937 (!) 120/56 mmHg 98.1 F (36.7 C) Oral 61 15 90 % -  02/23/14 1533 124/65 mmHg - - 66 15 91 % -  02/23/14 1530 - 97.6 F (36.4 C) Oral - - - -  02/23/14 1344 - - - - - 91 % -  02/23/14 1200 - 97.8 F (36.6 C) Oral - - - -  02/23/14 1110 136/64 mmHg - - (!) 51 (!) 21 (!) 89 % -  02/23/14 0849 - - - - - 91 % -   Current Weight  02/24/14 337 lb 15.4 oz (153.3 kg)   CBGs 151-142-150   Intake/Output from previous day: 11/27 0701 - 11/28 0700 In: -  Out: 1150 [Urine:1150]    PHYSICAL EXAM:  Heart: RRR, brady in 50s Lungs: Coarse BS on L with few exp wheezes bilaterally Wound: Clean and dry Chest tube: Tidaling in column, but no definite air leak    Lab Results: CBC: Recent Labs  02/23/14 0530  WBC 12.9*  HGB 14.9  HCT 46.2*  PLT 267   BMET:  Recent Labs  02/23/14 0530 02/24/14 0533  NA 142 141  K 3.3* 4.1  CL 92* 96  CO2 39* 37*  GLUCOSE 156* 150*  BUN 57* 54*  CREATININE 1.28* 1.07  CALCIUM 8.4 8.3*    PT/INR: No results for input(s): LABPROT, INR in the last 72 hours.    Assessment/Plan: S/P Procedure(s) (LRB): VIDEO ASSISTED THORACOSCOPY (Left) LUNG BIOPSY (Left) CXR stable, CT with tidaling in column.  Will leave CT to suction for now, but hopefully can d/c later today. UIP- on pirfenidone and steroids per CCM. Continue  current management.  Still desats with any exertion, talking, etc. Cr trending down. CV- Rate controlled AF overnight, but now back in Sr/brady. Continue current meds. DM- CBGs generally stable, continue SSI. Watch while on steroids.   LOS: 10 days    COLLINS,GINA H 02/24/2014  Patient seen and examined, agree with above Still has a small air leak- may take awhile to heal with steroids Apparently got pirfenidone today Recheck BNP in AM

## 2014-02-24 NOTE — Progress Notes (Signed)
Placed patient on CPAP for the night via auto-mode with minimum pressure set at 6cm and maximum pressure set at 20cm. Oxygen set at 8lpm with Sp02=91%

## 2014-02-25 ENCOUNTER — Inpatient Hospital Stay (HOSPITAL_COMMUNITY): Payer: BC Managed Care – PPO

## 2014-02-25 DIAGNOSIS — T50905A Adverse effect of unspecified drugs, medicaments and biological substances, initial encounter: Secondary | ICD-10-CM

## 2014-02-25 LAB — BASIC METABOLIC PANEL
Anion gap: 9 (ref 5–15)
BUN: 57 mg/dL — ABNORMAL HIGH (ref 6–23)
CHLORIDE: 91 meq/L — AB (ref 96–112)
CO2: 38 meq/L — AB (ref 19–32)
CREATININE: 1.18 mg/dL — AB (ref 0.50–1.10)
Calcium: 8.5 mg/dL (ref 8.4–10.5)
GFR calc non Af Amer: 48 mL/min — ABNORMAL LOW (ref >90)
GFR, EST AFRICAN AMERICAN: 55 mL/min — AB (ref >90)
Glucose, Bld: 151 mg/dL — ABNORMAL HIGH (ref 70–99)
Potassium: 3.5 mEq/L — ABNORMAL LOW (ref 3.7–5.3)
Sodium: 138 mEq/L (ref 137–147)

## 2014-02-25 LAB — GLUCOSE, CAPILLARY
GLUCOSE-CAPILLARY: 136 mg/dL — AB (ref 70–99)
GLUCOSE-CAPILLARY: 170 mg/dL — AB (ref 70–99)
Glucose-Capillary: 171 mg/dL — ABNORMAL HIGH (ref 70–99)
Glucose-Capillary: 158 mg/dL — ABNORMAL HIGH (ref 70–99)

## 2014-02-25 LAB — PRO B NATRIURETIC PEPTIDE: Pro B Natriuretic peptide (BNP): 478.6 pg/mL — ABNORMAL HIGH (ref 0–125)

## 2014-02-25 MED ORDER — METHYLPREDNISOLONE SODIUM SUCC 125 MG IJ SOLR
80.0000 mg | Freq: Two times a day (BID) | INTRAMUSCULAR | Status: DC
  Administered 2014-02-25 – 2014-02-26 (×2): 80 mg via INTRAVENOUS
  Filled 2014-02-25 (×4): qty 1.28

## 2014-02-25 NOTE — Progress Notes (Signed)
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 She underwent left VATS 11/18 and on 11/20 was hypoxic , requiring NRB . Treated with lasix and PCCM asked to evaluate.  STUDIES:  11/18 surgical lung biopsy- cellular UIP pattern   SIGNIFICANT EVENTS: 11/18 left vats> Frozen c/w ILD > sent out>>> 11/18 L chest tube > out 11/20  11/21  trx to SDU  11/25 left chest tube reinserted >>  SUBJECTIVE:   Chest tube to water seal 11/27 Significant desats with ambulation persists  VITAL SIGNS: Temp:  [97.5 F (36.4 C)-98.5 F (36.9 C)] 97.9 F (36.6 C) (11/29 0745) Pulse Rate:  [51-70] 52 (11/29 0400) Resp:  [12-21] 14 (11/29 0400) BP: (125-142)/(56-69) 132/65 mmHg (11/29 0400) SpO2:  [87 %-96 %] 93 % (11/29 0400) Weight:  [147.6 kg (325 lb 6.4 oz)] 147.6 kg (325 lb 6.4 oz) (11/29 0419)   HEMODYNAMICS: HR in low 50s. CV ok       INTAKE / OUTPUT:  Intake/Output Summary (Last 24 hours) at 02/25/14 0915 Last data filed at 02/25/14 0419  Gross per 24 hour  Intake    860 ml  Output   1610 ml  Net   -750 ml    PHYSICAL EXAMINATION: General:  Morbidly obese female, comfortable in chair HEENT: NCAT, EOMi PULM: pleural rub on left, clear on right, air leak less  CV: RRR, no mgr AB: BS+, soft Ext: trace edema Neuro: A&OX4, maew Psyche: pleasant mood  LABS:  CBC  Recent Labs Lab 02/21/14 0343 02/23/14 0530  WBC 9.0 12.9*  HGB 15.2* 14.9  HCT 48.3* 46.2*  PLT 321 267   Coag's No results for input(s): APTT, INR in the last 168 hours. BMET  Recent Labs Lab 02/23/14 0530 02/24/14 0533 02/25/14 0450  NA 142 141  138  K 3.3* 4.1 3.5*  CL 92* 96 91*  CO2 39* 37* 38*  BUN 57* 54* 57*  CREATININE 1.28* 1.07 1.18*  GLUCOSE 156* 150* 151*   Electrolytes  Recent Labs Lab 02/23/14 0530 02/24/14 0533 02/25/14 0450  CALCIUM 8.4 8.3* 8.5   Sepsis Markers No results for input(s): LATICACIDVEN, PROCALCITON, O2SATVEN in the last 168 hours. ABG No results for input(s): PHART, PCO2ART, PO2ART in the last 168 hours. Liver Enzymes  Recent Labs Lab 02/23/14 0530  AST 13  ALT 17  ALKPHOS 67  BILITOT 0.3  ALBUMIN 2.6*   Cardiac Enzymes  Recent Labs Lab 02/19/14 0341 02/21/14 0343 02/25/14 0450  PROBNP 3278.0* 2017.0* 478.6*   Glucose  Recent Labs Lab 02/23/14 2136 02/24/14 0803 02/24/14 1112 02/24/14 1544 02/24/14 2143 02/25/14 0743  GLUCAP 142* 122* 185* 177* 191* 136*    Imaging Dg Chest Port 1 View  02/24/2014   CLINICAL DATA:  Subsequent evaluation for pneumothorax, chest tube  EXAM: PORTABLE CHEST - 1 VIEW  COMPARISON:  02/23/2014  FINDINGS: Moderate cardiac enlargement. Stable left chest tube and right PICC line. Stable trace apical pneumothorax on the left.  Patchy bilateral infiltrates unchanged.  IMPRESSION: No significant change.  Stable trace left apical pneumothorax   Electronically Signed   By: Kristin [name]  Kristin [name].  On: 02/24/2014 07:50   Lab Results  Component Value Date   ESRSEDRATE 17 01/08/2014      ASSESSMENT / PLAN:  PULMONARY A: Acute hypoxic respiratory failure  ILD post left VATS 11/18- UIP pattern on biopsy with monocellular infiltrate suggestive of underlying connective tissue disease Left apical pneumothorax OSA  P:   Will plan antifibrotic therapy with pirfenidone as well as anti-inflammatory therapy with prednisone Continue solumedrol 80mg  IV but reduce to q12H through 11/30, then change to prednisone 60mg , plan one week at that dose, then decrease to 40mg  daily until seen in pulmonary clinic Ambulate as able Incentive spirometry,  flutter O2 for O2 saturation > 90% CPAP qhs CT per TCTS, hopefully out 11/28. PTX looks about the same on waterseal 11/28 Obtain an oximiser for the pt    CARDIOVASCULAR A:  Hx of PAF on dilt, flecainide Bradycardia Elevated BNP P:  Rate controlled on flecainide,dig &cardizem, seen by cardiology but HR low  Discussed with cardiology Kristin [name]:  D/c digoxin Goal slightly net negative fluid balance Continue lasix 60mg  PO daily  RENAL A:  Hypokalemia from diuresis  P:   Monitor BMET and UOP Replace electrolytes as needed Continue scheduled potassium  GASTROINTESTINAL A:   MO GERD P:   PPI bid ac, esp since coughing on steroids  Heart healthy diet  HEMATOLOGIC A:  No acute issue P:  Monitor  INFECTIOUS A:   Leukocytosis - due to steroids, nothing to suggest infection P:   Monitor clinically.  ENDOCRINE A: Hyperglycemia due to steroids  P:   Monitor glucose, SSI for > 180 Diabetic educator consult  NEUROLOGIC A:  No acute issue P:   Intact  FAMILY  - Updates: Patient and husband updated at bedside 11/29  Summary- while biopsy shows UIP there is inflammation in the biopsy that may be amenable to steroids. Course planned. Will likely also be placed on pirfenidone as an outpt  Kristin Hartatrick WrightMD   02/25/2014, 9:15 AM Kristin [name]  [phone]  Cell  [phone]  If no response or cell goes to voicemail, call [name] [phone]

## 2014-02-25 NOTE — Progress Notes (Addendum)
       [address]       [place] [id number]             [phone]          11 Days Post-Op Procedure(s) (LRB): VIDEO ASSISTED THORACOSCOPY (Left) LUNG BIOPSY (Left)  Subjective: Comfortable, no complaints.  Walked 4x yesterday but requires NRB with exertion due to desats.  Objective: Vital signs in last 24 hours: Patient Vitals for the past 24 hrs:  BP Temp Temp src Pulse Resp SpO2 Weight  02/25/14 0419 - - - - - - (!) 325 lb 6.4 oz (147.6 kg)  02/25/14 0400 132/65 mmHg 97.5 F (36.4 C) Oral (!) 52 14 93 % -  02/24/14 2338 - - - 65 (!) 21 90 % -  02/24/14 2335 (!) 134/56 mmHg 98.5 F (36.9 C) Oral (!) 59 20 (!) 89 % -  02/24/14 1955 125/69 mmHg 97.5 F (36.4 C) Oral (!) 58 16 91 % -  02/24/14 1954 - - - - - 91 % -  02/24/14 1550 136/68 mmHg 97.9 F (36.6 C) Oral (!) 57 18 90 % -  02/24/14 1117 - - - (!) 51 18 96 % -  02/24/14 1116 (!) 142/64 mmHg - - (!) 55 12 (!) 87 % -  02/24/14 1100 - 97.8 F (36.6 C) Oral - - - -  02/24/14 0946 - - - 70 20 93 % -   Current Weight  02/25/14 325 lb 6.4 oz (147.6 kg)     Intake/Output from previous day: 11/28 0701 - 11/29 0700 In: 1340 [P.O.:1320; I.V.:20] Out: 2060 [Urine:2050; Chest Tube:10] CBGs [phone]   PHYSICAL EXAM:  Heart: RRR, brady in 50s Lungs: Crackles in bases Wound: Clean and dry Chest tube: 1-2 air leak with cough    Lab Results: CBC: Recent Labs  02/23/14 0530  WBC 12.9*  HGB 14.9  HCT 46.2*  PLT 267   BMET:  Recent Labs  02/24/14 0533 02/25/14 0450  NA 141 138  K 4.1 3.5*  CL 96 91*  CO2 37* 38*  GLUCOSE 150* 151*  BUN 54* 57*  CREATININE 1.07 1.18*  CALCIUM 8.3* 8.5    PT/INR: No results for input(s): LABPROT, INR in the last 72 hours.  ProBNP=478.6   Assessment/Plan: S/P Procedure(s) (LRB): VIDEO ASSISTED THORACOSCOPY (Left) LUNG BIOPSY (Left) CXR stable, CT with 1-2 air leak. Continue CT to suction. UIP- treatment per CCM.  Hypokalemia- replace  K+. Renal- Cr trending down. CV- No further AF but RN reports pt had bradycardic episode into 30s after flecainide was given yesterday. May need to decrease meds further. Cardiology has been following. DM- CBGs generally stable, continue SSI. Watch while on steroids.   LOS: 11 days    COLLINS,Kristin [name] 02/25/2014  Patient seen and examined, clinical status unchanged She still has an air leak. Will keep on water seal for now. Chest x ray shows a slightly increased pneumo Still has to go on NRB to ambulate. She is not ready for discharge

## 2014-02-26 ENCOUNTER — Inpatient Hospital Stay (HOSPITAL_COMMUNITY): Payer: BC Managed Care – PPO

## 2014-02-26 DIAGNOSIS — Z9889 Other specified postprocedural states: Secondary | ICD-10-CM

## 2014-02-26 LAB — CBC
HCT: 48 % — ABNORMAL HIGH (ref 36.0–46.0)
Hemoglobin: 15.7 g/dL — ABNORMAL HIGH (ref 12.0–15.0)
MCH: 28.6 pg (ref 26.0–34.0)
MCHC: 32.7 g/dL (ref 30.0–36.0)
MCV: 87.4 fL (ref 78.0–100.0)
Platelets: 217 10*3/uL (ref 150–400)
RBC: 5.49 MIL/uL — AB (ref 3.87–5.11)
RDW: 13.2 % (ref 11.5–15.5)
WBC: 17.1 10*3/uL — ABNORMAL HIGH (ref 4.0–10.5)

## 2014-02-26 LAB — GLUCOSE, CAPILLARY
Glucose-Capillary: 136 mg/dL — ABNORMAL HIGH (ref 70–99)
Glucose-Capillary: 222 mg/dL — ABNORMAL HIGH (ref 70–99)
Glucose-Capillary: 205 mg/dL — ABNORMAL HIGH (ref 70–99)

## 2014-02-26 LAB — BASIC METABOLIC PANEL
Anion gap: 15 (ref 5–15)
BUN: 57 mg/dL — AB (ref 6–23)
CO2: 35 mEq/L — ABNORMAL HIGH (ref 19–32)
Calcium: 8.6 mg/dL (ref 8.4–10.5)
Chloride: 88 mEq/L — ABNORMAL LOW (ref 96–112)
Creatinine, Ser: 1.15 mg/dL — ABNORMAL HIGH (ref 0.50–1.10)
GFR calc non Af Amer: 49 mL/min — ABNORMAL LOW (ref >90)
GFR, EST AFRICAN AMERICAN: 57 mL/min — AB (ref >90)
GLUCOSE: 156 mg/dL — AB (ref 70–99)
POTASSIUM: 3.5 meq/L — AB (ref 3.7–5.3)
Sodium: 138 mEq/L (ref 137–147)

## 2014-02-26 MED ORDER — POTASSIUM CHLORIDE CRYS ER 20 MEQ PO TBCR
40.0000 meq | EXTENDED_RELEASE_TABLET | Freq: Every day | ORAL | Status: AC
  Administered 2014-02-26 – 2014-02-28 (×3): 40 meq via ORAL
  Filled 2014-02-26 (×3): qty 2

## 2014-02-26 MED ORDER — PREDNISONE 50 MG PO TABS
60.0000 mg | ORAL_TABLET | Freq: Every day | ORAL | Status: DC
  Administered 2014-02-27 – 2014-03-01 (×3): 60 mg via ORAL
  Filled 2014-02-26 (×4): qty 1

## 2014-02-26 NOTE — Telephone Encounter (Signed)
Paperwork filled out on our end, need pt signature and financial info from pt.  lmtcb X1 to make pt aware.

## 2014-02-26 NOTE — Progress Notes (Addendum)
       [address]       [place] [id number]             [phone]          12 Days Post-Op Procedure(s) (LRB): VIDEO ASSISTED THORACOSCOPY (Left) LUNG BIOPSY (Left)  Subjective: Stable night, no new complaints.   Objective: Vital signs in last 24 hours: Patient Vitals for the past 24 hrs:  BP Temp Temp src Pulse Resp SpO2 Weight  02/26/14 0402 (!) 130/52 mmHg 97.8 F (36.6 C) Oral (!) 54 20 93 % (!) 325 lb 6.4 oz (147.6 kg)  02/26/14 0129 - - - (!) 47 - - -  02/26/14 0013 - - - - - 92 % -  02/26/14 0001 - - - 65 19 91 % -  02/25/14 2340 (!) 125/52 mmHg 98.7 F (37.1 C) Oral (!) 59 13 94 % -  02/25/14 2039 127/65 mmHg 98.4 F (36.9 C) Oral 85 (!) 21 97 % -  02/25/14 1740 - - - 79 (!) 33 92 % -  02/25/14 1615 (!) 153/60 mmHg 98.4 F (36.9 C) Oral 63 17 (!) 89 % -  02/25/14 1235 140/74 mmHg 98.1 F (36.7 C) Oral (!) 59 18 98 % -  02/25/14 0745 133/65 mmHg 97.9 F (36.6 C) Oral 62 (!) 22 90 % -   Current Weight  02/26/14 325 lb 6.4 oz (147.6 kg)     Intake/Output from previous day: 11/29 0701 - 11/30 0700 In: 1460 [P.O.:1440; I.V.:20] Out: 2730 [Urine:2700; Chest Tube:30]  CBGs 158-170-156   PHYSICAL EXAM:  Heart: Brady, HR in 40s Lungs: Coarse BS on R Wound: Clean and dry Chest tube: + increased air leak today    Lab Results: CBC: Recent Labs  02/26/14 0417  WBC 17.1*  HGB 15.7*  HCT 48.0*  PLT 217   BMET:  Recent Labs  02/25/14 0450 02/26/14 0417  NA 138 138  K 3.5* 3.5*  CL 91* 88*  CO2 38* 35*  GLUCOSE 151* 156*  BUN 57* 57*  CREATININE 1.18* 1.15*  CALCIUM 8.5 8.6    PT/INR: No results for input(s): LABPROT, INR in the last 72 hours.  CXR: increased R ptx  Assessment/Plan: S/P Procedure(s) (LRB): VIDEO ASSISTED THORACOSCOPY (Left) LUNG BIOPSY (Left)  CXR shows pneumothorax increased today, air leak appears larger with cough. May need to resume low suction to CT.  CV- bradycardia, HR in 40s this am.  Digoxin d/c'ed yesterday per cardiology recommendations.  Will continue to monitor.  May need to further titrate meds.  Hypokalemia- replace K+ per protocol.  Hyperglycemia- continue SSI while on steroids.  Pulm- continue tx for UIP per pulm/CCM.  Renal- Cr stable.  Continue PT/ambulation.    LOS: 12 days    COLLINS,GINA H 02/26/2014  Patient seen and examined. Discussed case with Dr. Kendrick FriesMcQuaid Her pneumo looks a little bigger on chest x-ray Her air leak may actually be a little bit smaller Will place back to suction

## 2014-02-26 NOTE — Progress Notes (Signed)
Utilization review completed.  

## 2014-02-26 NOTE — Progress Notes (Signed)
Placed patient on CPAP for the night with oxygen set at 8lpm  

## 2014-02-26 NOTE — Progress Notes (Addendum)
PULMONARY / CRITICAL CARE MEDICINE   Name: Kristin [name] MRN: [id number] DOB: 10/07/1950    ADMISSION DATE:  02/14/2014 CONSULTATION DATE:  11/20  REFERRING MD :  CTS  CHIEF COMPLAINT:  SOB  INITIAL PRESENTATION: 63 yowf morbidly obese remote smoker (quit '85, <15 Pyrs)   followed by Dr. Kendrick [name] for ILD thought to stem from pneumonia in 04/2013 with progressive lung changes resulting in hypoxia. She underwent left VATS 11/18 and on 11/20 was hypoxic , requiring NRB . Treated with lasix and PCCM asked to evaluate.  STUDIES:  May 2015 CT chest> left greater than right interstitial changes throughout the left upper lobe as well as in the left base, question focal areas of bronchiectasis in the lingula, basilar very mild interstitial changes in a peripheral distribution in the right lung overall findings are nonspecific 08/2013 PFT> ratio 74%, FEV1 1.66L (74% pred, 1% change), TLC 2.98L (59% pred), ERV 0.03 (3% pred), DLCO 14.2 (48% pred) 12/2013 ANA neg, Anti Jo-1 neg, CCP neg, RF neg, SSA neg, SSB neg, SCL-70 neg, centromere neg, Hypersensitivity panel neg 12/2013 CBC with diff > 6.6% relative eos, absolute 600/uL 12/2013 PFT> Ratio 75%, FEV1 1.47L (65% pred, 1% change), TLC 2.77L (55% pred), DLCO 11.7 (38% pred) 12/2013 CT chest > progressive ILD changes compared to previous study. New Right sided infiltrate 12/2013 ABG RA> 7.42/44/80/21  11/18 surgical lung biopsy- cellular UIP pattern   SIGNIFICANT EVENTS: 11/18 left vats> Frozen c/w ILD > monocellular UIP 11/18 L chest tube > out 11/20  11/21  trx to SDU  11/25 left chest tube reinserted >>  SUBJECTIVE:   Continued air leak, continues to walk 4-5 times per day  VITAL SIGNS: Temp:  [97.8 F (36.6 C)-98.7 F (37.1 C)] 97.8 F (36.6 C) (11/30 0402) Pulse Rate:  [47-85] 54 (11/30 0402) Resp:  [13-33] 20 (11/30 0402) BP: (125-153)/(52-74) 130/52 mmHg (11/30 0402) SpO2:  [89 %-98 %] 93 % (11/30 0402) Weight:  [147.6 kg (325  lb 6.4 oz)] 147.6 kg (325 lb 6.4 oz) (11/30 0402)   HEMODYNAMICS:      INTAKE / OUTPUT:  Intake/Output Summary (Last 24 hours) at 02/26/14 0828 Last data filed at 02/26/14 0410  Gross per 24 hour  Intake   1460 ml  Output   2730 ml  Net  -1270 ml    PHYSICAL EXAMINATION: General:  Morbidly obese female, comfortable in bed HEENT: NCAT, EOMi PULM: pleural rub on left, few crackles in right CV: RRR, no mgr AB: BS+, soft Ext: no edema Neuro: A&OX4, maew  LABS:  CBC  Recent Labs Lab 02/21/14 0343 02/23/14 0530 02/26/14 0417  WBC 9.0 12.9* 17.1*  HGB 15.2* 14.9 15.7*  HCT 48.3* 46.2* 48.0*  PLT 321 267 217   Coag's No results for input(s): APTT, INR in the last 168 hours. BMET  Recent Labs Lab 02/24/14 0533 02/25/14 0450 02/26/14 0417  NA 141 138 138  K 4.1 3.5* 3.5*  CL 96 91* 88*  CO2 37* 38* 35*  BUN 54* 57* 57*  CREATININE 1.07 1.18* 1.15*  GLUCOSE 150* 151* 156*   Electrolytes  Recent Labs Lab 02/24/14 0533 02/25/14 0450 02/26/14 0417  CALCIUM 8.3* 8.5 8.6   Sepsis Markers No results for input(s): LATICACIDVEN, PROCALCITON, O2SATVEN in the last 168 hours. ABG No results for input(s): PHART, PCO2ART, PO2ART in the last 168 hours. Liver Enzymes  Recent Labs Lab 02/23/14 0530  AST 13  ALT 17  ALKPHOS 67  BILITOT 0.3  ALBUMIN 2.6*   Cardiac Enzymes  Recent Labs Lab 02/21/14 0343 02/25/14 0450  PROBNP 2017.0* 478.6*   Glucose  Recent Labs Lab 02/24/14 1544 02/24/14 2143 02/25/14 0743 02/25/14 1232 02/25/14 1615 02/25/14 2155  GLUCAP 177* 191* 136* 171* 158* 170*    Imaging Dg Chest Port 1 View  02/25/2014   CLINICAL DATA:  UIP  EXAM: PORTABLE CHEST - 1 VIEW  COMPARISON:  02/24/2014  FINDINGS: Cardiomegaly again noted. Patchy bilateral infiltrates are unchanged from prior exam. Stable right arm PICC line position. There is a left chest tube unchanged in position. Small left upper and lateral pneumothorax. Surgical  sutures and postsurgical changes in left upper lobe. Trace tiny left upper medial pneumothorax.  IMPRESSION: Persistent patchy bilateral infiltrates. Stable left chest tube position. Small left upper lateral pneumothorax.   Electronically Signed   By: Natasha MeadLiviu  Pop M.D.   On: 02/25/2014 10:25   Lab Results  Component Value Date   ESRSEDRATE 17 01/08/2014      ASSESSMENT / PLAN:  PULMONARY A: Acute hypoxic respiratory failure due to UIP, possibly pulm edema ILD post left VATS 11/18- UIP pattern on biopsy with monocellular infiltrate suggestive of underlying connective tissue disease; 11/30 now s/p 10 days moderately high dose solumedrol Left apical pneumothorax OSA  P:   Plan pirfenidone as outpatient Decrease steroid to prednisone 60mg , plan one week at that dose, then decrease to 40mg  daily until seen in pulmonary clinic Ambulate as able Incentive spirometry, flutter O2 for O2 saturation > 90% > oximizer CPAP qhs Chest tube per TCTS Consider transfer to [facility] if no improvement in oxygenation by 12/4 Check LE doppler, consider CT angio this week  CARDIOVASCULAR A:  Hx of PAF on dilt, flecainide Bradycardia Elevated BNP > improved with lasix, unknown cardiac function P:  Check echo Rate controlled on flecainide,dig &cardizem, seen by cardiology but HR low  Goal slightly net negative fluid balance Continue lasix 60mg  PO daily  RENAL A:  Hypokalemia from diuresis  P:   Monitor BMET and UOP Replace electrolytes as needed Continue scheduled potassium oral  GASTROINTESTINAL A:   MO GERD P:   PPI bid ac Heart healthy diet  HEMATOLOGIC A:  No acute issue P:  Monitor  INFECTIOUS A:   Leukocytosis - due to steroids, still nothing to suggest infection P:   Monitor clinically.  ENDOCRINE A: Hyperglycemia due to steroids  P:   Monitor glucose, SSI for > 180 Diabetic educator consult  NEUROLOGIC A:  No acute issue P:   Intact  FAMILY  - Updates: Patient  and husband updated at bedside 11/29  Summary- while biopsy shows UIP there is inflammation in the biopsy that may be amenable to steroids. Course planned. Will likely also be placed on pirfenidone as an outpt; Chest tube per TCTS, if no improvement by 12/4 [facility]  Heber CarolinaBrent McQuaid, MD [place] [facility] Pager: [phone] Cell: [phone] If no response, call [phone]

## 2014-02-26 NOTE — Progress Notes (Signed)
*  PRELIMINARY RESULTS* Vascular Ultrasound Lower extremity venous duplex has been completed.  Preliminary findings: Technically limited due to patient body habitus. No obvious evidence of DVT in visualized veins.  Farrel DemarkJill Eunice, RDMS, RVT   02/26/2014, 1:48 PM

## 2014-02-27 ENCOUNTER — Telehealth: Payer: Self-pay | Admitting: Pulmonary Disease

## 2014-02-27 ENCOUNTER — Inpatient Hospital Stay (HOSPITAL_COMMUNITY): Payer: BC Managed Care – PPO

## 2014-02-27 DIAGNOSIS — J9383 Other pneumothorax: Secondary | ICD-10-CM

## 2014-02-27 LAB — BASIC METABOLIC PANEL
ANION GAP: 10 (ref 5–15)
BUN: 55 mg/dL — ABNORMAL HIGH (ref 6–23)
CALCIUM: 8.1 mg/dL — AB (ref 8.4–10.5)
CO2: 36 meq/L — AB (ref 19–32)
Chloride: 93 mEq/L — ABNORMAL LOW (ref 96–112)
Creatinine, Ser: 1.08 mg/dL (ref 0.50–1.10)
GFR calc Af Amer: 62 mL/min — ABNORMAL LOW (ref >90)
GFR calc non Af Amer: 53 mL/min — ABNORMAL LOW (ref >90)
Glucose, Bld: 105 mg/dL — ABNORMAL HIGH (ref 70–99)
Potassium: 3.5 mEq/L — ABNORMAL LOW (ref 3.7–5.3)
Sodium: 139 mEq/L (ref 137–147)

## 2014-02-27 LAB — GLUCOSE, CAPILLARY
GLUCOSE-CAPILLARY: 119 mg/dL — AB (ref 70–99)
GLUCOSE-CAPILLARY: 86 mg/dL (ref 70–99)
GLUCOSE-CAPILLARY: 140 mg/dL — AB (ref 70–99)
Glucose-Capillary: 185 mg/dL — ABNORMAL HIGH (ref 70–99)

## 2014-02-27 MED ORDER — MIDAZOLAM HCL 2 MG/2ML IJ SOLN
2.0000 mg | Freq: Once | INTRAMUSCULAR | Status: AC
  Administered 2014-02-27: 2 mg via INTRAVENOUS

## 2014-02-27 MED ORDER — LIDOCAINE HCL (PF) 1 % IJ SOLN
30.0000 mL | Freq: Once | INTRAMUSCULAR | Status: DC
  Filled 2014-02-27 (×3): qty 30

## 2014-02-27 MED ORDER — MIDAZOLAM HCL 2 MG/2ML IJ SOLN
INTRAMUSCULAR | Status: AC
  Administered 2014-02-27: 2 mg via INTRAVENOUS
  Filled 2014-02-27: qty 2

## 2014-02-27 MED ORDER — FENTANYL CITRATE 0.05 MG/ML IJ SOLN
INTRAMUSCULAR | Status: AC
  Administered 2014-02-27: 50 ug
  Filled 2014-02-27: qty 2

## 2014-02-27 MED ORDER — LIDOCAINE HCL (PF) 1 % IJ SOLN
INTRAMUSCULAR | Status: AC
  Filled 2014-02-27: qty 5

## 2014-02-27 NOTE — Progress Notes (Addendum)
       [address]       [place] [id number]             [phone]          13 Days Post-Op Procedure(s) (LRB): VIDEO ASSISTED THORACOSCOPY (Left) LUNG BIOPSY (Left)  Subjective: Had more pain across chest yesterday when she ambulated.  Breathing stable, still with high O2 requirements.  Pain better this am.   Objective: Vital signs in last 24 hours: Patient Vitals for the past 24 hrs:  BP Temp Temp src Pulse Resp SpO2 Weight  02/27/14 0700 - 97.7 F (36.5 C) Oral - - - -  02/27/14 0437 - - - - - - (!) 324 lb 4.8 oz (147.1 kg)  02/27/14 0404 (!) 123/52 mmHg 98.1 F (36.7 C) Oral (!) 53 18 92 % -  02/27/14 0401 - - - (!) 49 - - -  02/27/14 0010 (!) 131/52 mmHg 98.1 F (36.7 C) Oral (!) 58 20 93 % -  02/26/14 2322 - - - 66 16 (!) 88 % -  02/26/14 2006 - 98.7 F (37.1 C) Oral - - - -  02/26/14 2004 134/71 mmHg - - (!) 56 15 (!) 88 % -  02/26/14 1700 - 98.1 F (36.7 C) Oral - - - -  02/26/14 1100 133/62 mmHg 97.8 F (36.6 C) Oral - - - -  02/26/14 0828 123/67 mmHg 98.3 F (36.8 C) Oral - - 97 % -   Current Weight  02/27/14 324 lb 4.8 oz (147.1 kg)   CBGs 205-119-105  Intake/Output from previous day: 11/30 0701 - 12/01 0700 In: 2420 [P.O.:2400; I.V.:20] Out: 2370 [Urine:2350; Chest Tube:20]    PHYSICAL EXAM:  Heart: RRR, brady in 50s Lungs: Decreaed BS bilaterally Wound: Clean and dry Chest tube: 1-3 air leak    Lab Results: CBC: Recent Labs  02/26/14 0417  WBC 17.1*  HGB 15.7*  HCT 48.0*  PLT 217   BMET:  Recent Labs  02/26/14 0417 02/27/14 0408  NA 138 139  K 3.5* 3.5*  CL 88* 93*  CO2 35* 36*  GLUCOSE 156* 105*  BUN 57* 55*  CREATININE 1.15* 1.08  CALCIUM 8.6 8.1*    PT/INR: No results for input(s): LABPROT, INR in the last 72 hours.  CXR: decreased L pneumothorax, new R pneumothorax  Assessment/Plan: S/P Procedure(s) (LRB): VIDEO ASSISTED THORACOSCOPY (Left) LUNG BIOPSY (Left)  CXR shows left  pneumothorax improved on suction, air leak stable.  Continue CT to suction.  New R pneumothorax on CXR today, may need R CT.  Will discuss with MD.  CV- bradycardia stable. Continue flecainide, Cardizem per cardiology.  Hypokalemia- replace K+ per protocol.  Hyperglycemia- continue SSI while on steroids.  Pulm- continue tx for UIP per pulm/CCM.  Renal- Cr stable.  Continue PT/ambulation.   LOS: 13 days    COLLINS,GINA H 02/27/2014  Patient seen and examined, agree with above She has a new spontaneous right pneumothorax. Needs a CT placed. She is aware of the risks and benefits

## 2014-02-27 NOTE — Progress Notes (Signed)
Inpatient Diabetes Program Recommendations  AACE/ADA: New Consensus Statement on Inpatient Glycemic Control (2013)  Target Ranges:  Prepandial:   less than 140 mg/dL      Peak postprandial:   less than 180 mg/dL (1-2 hours)      Critically ill patients:  140 - 180 mg/dL     Results for Kristin [name], Kristin [name] (MRN [id number]) as of 02/27/2014 09:07  Ref. Range 02/26/2014 08:40 02/26/2014 11:59 02/26/2014 16:09 02/26/2014 21:51  Glucose-Capillary Latest Range: 70-99 mg/dL 244136 (H) 010222 (H) 272205 (H) 119 (H)     Current Insulin Orders: Novolog Resistant SSI   **Last dose IV Solumedrol given yesterday at 6:30am  **Patient now on Prednisone 60 mg daily   MD- Please consider adding Novolog Meal Coverage- Novolog 4 units tid with meals     Will follow Ambrose FinlandJeannine Johnston Keishia Ground RN, MSN, CDE Diabetes Coordinator Inpatient Diabetes Program Team Pager: [phone] (8a-10p)

## 2014-02-27 NOTE — Procedures (Signed)
Chest x ray this AM showed a new right spontaneous pneumothorax. Chest tube insertion indicated.  She is aware of risks and benefits  2 mg versed IV  Using sterile technique and 30 ml 1% lidocaine local anesthetic a 28 F chest tube was placed into the right pleural space. + rush of air. Patient tolerated well. Chest x ray pending.

## 2014-02-27 NOTE — Progress Notes (Signed)
PULMONARY / CRITICAL CARE MEDICINE   Name: Kristin [name] MRN: [id number] DOB: 09/04/1950    ADMISSION DATE:  02/14/2014 CONSULTATION DATE:  11/20  REFERRING MD :  CTS  CHIEF COMPLAINT:  SOB  INITIAL PRESENTATION: 63 yowf morbidly obese remote smoker (quit '85, <15 Pyrs)   followed by Dr. 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 She underwent left VATS 11/18 and on 11/20 was hypoxic , requiring NRB . Treated with lasix and PCCM asked to evaluate.  STUDIES:  May 2015 CT chest> left greater than right interstitial changes throughout the left upper lobe as well as in the left base, question focal areas of bronchiectasis in the lingula, basilar very mild interstitial changes in a peripheral distribution in the right lung overall findings are nonspecific 08/2013 PFT> ratio 74%, FEV1 1.66L (74% pred, 1% change), TLC 2.98L (59% pred), ERV 0.03 (3% pred), DLCO 14.2 (48% pred) 12/2013 ANA neg, Anti Jo-1 neg, CCP neg, RF neg, SSA neg, SSB neg, SCL-70 neg, centromere neg, Hypersensitivity panel neg 12/2013 CBC with diff > 6.6% relative eos, absolute 600/uL 12/2013 PFT> Ratio 75%, FEV1 1.47L (65% pred, 1% change), TLC 2.77L (55% pred), DLCO 11.7 (38% pred) 12/2013 CT chest > progressive ILD changes compared to previous study. New Right sided infiltrate 12/2013 ABG RA> 7.42/44/80/21  11/18 surgical lung biopsy- cellular UIP pattern   SIGNIFICANT EVENTS: 11/18 left vats> Frozen c/w ILD > monocellular UIP 11/18 L chest tube > out 11/20  11/21  trx to SDU  11/25 left chest tube reinserted >> 12/1 spontaneous right PTX/ required CT  SUBJECTIVE:   Has new CT after spont right PTX   VITAL SIGNS: Temp:  [97.7 F (36.5 C)-98.7 F (37.1 C)] 98.3 F (36.8 C) (12/01 1100) Pulse Rate:  [49-66] 55 (12/01 1100) Resp:  [15-20] 15 (12/01 1100) BP: (114-134)/(47-71) 116/55 mmHg (12/01 1100) SpO2:  [83 %-93 %] 92 % (12/01 1140) FiO2  (%):  [88 %] 88 % (11/30 2149) Weight:  [147.1 kg (324 lb 4.8 oz)] 147.1 kg (324 lb 4.8 oz) (12/01 0437)  6 liters oximizer  HEMODYNAMICS:    Vent Mode:  [-]  FiO2 (%):  [88 %] 88 % INTAKE / OUTPUT:  Intake/Output Summary (Last 24 hours) at 02/27/14 1141 Last data filed at 02/27/14 0438  Gross per 24 hour  Intake   1940 ml  Output    970 ml  Net    970 ml    PHYSICAL EXAMINATION: General:  Morbidly obese female, comfortable in bed HEENT: NCAT, EOMi PULM: pleural rub on left, crackles posterior + airleak bilateral chest tubes Right > left  CV: RRR, no mgr AB: BS+, soft Ext: no edema Neuro: A&OX4, maew  LABS:  CBC  Recent Labs Lab 02/21/14 0343 02/23/14 0530 02/26/14 0417  WBC 9.0 12.9* 17.1*  HGB 15.2* 14.9 15.7*  HCT 48.3* 46.2* 48.0*  PLT 321 267 217   Coag's No results for input(s): APTT, INR in the last 168 hours. BMET  Recent Labs Lab 02/25/14 0450 02/26/14 0417 02/27/14 0408  NA 138 138 139  K 3.5* 3.5* 3.5*  CL 91* 88* 93*  CO2 38* 35* 36*  BUN 57* 57* 55*  CREATININE 1.18* 1.15* 1.08  GLUCOSE 151* 156* 105*   Electrolytes  Recent Labs Lab 02/25/14 0450 02/26/14 0417 02/27/14 0408  CALCIUM 8.5 8.6 8.1*   Sepsis Markers No results for input(s): LATICACIDVEN, PROCALCITON, O2SATVEN in the last  168 hours. ABG No results for input(s): PHART, PCO2ART, PO2ART in the last 168 hours. Liver Enzymes  Recent Labs Lab 02/23/14 0530  AST 13  ALT 17  ALKPHOS 67  BILITOT 0.3  ALBUMIN 2.6*   Cardiac Enzymes  Recent Labs Lab 02/21/14 0343 02/25/14 0450  PROBNP 2017.0* 478.6*   Glucose  Recent Labs Lab 02/25/14 2155 02/26/14 0840 02/26/14 1159 02/26/14 1609 02/26/14 2151 02/27/14 0820  GLUCAP 170* 136* 222* 205* 119* 86    Imaging Dg Chest Port 1 View  02/26/2014   CLINICAL DATA:  Usual interstitial pneumonitis.  EXAM: PORTABLE CHEST - 1 VIEW  COMPARISON:  02/25/2014  FINDINGS: Right PICC remains in place with tip partially  obscured by overlying telemetry leads but likely in the region of the lower SVC/cavoatrial junction. Cardiac silhouette remains enlarged. Left-sided chest tube remains in place. Staple line noted in the left upper lung. There is a small left pneumothorax, increased from prior. Patchy bilateral interstitial and airspace opacities do not appear significantly changed. No definite pleural effusion is identified.  IMPRESSION: 1. Small left pneumothorax, increased from prior. 2. Unchanged bilateral lung opacities.   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- UIP pattern on biopsy with monocellular infiltrate suggestive of underlying connective tissue disease; 11/30 now s/p 10 days moderately high dose solumedrol Left apical pneumothorax OSA New right spontaneous PTX 12/1 required CT placement  P:   Plan pirfenidone as outpatient Decrease steroid to prednisone 60mg , plan one week at that dose, then decrease to 40mg  daily until seen in pulmonary clinic Ambulate as able Incentive spirometry, flutter O2 for O2 saturation > 90% > oximizer CPAP qhs Chest tubes per TCTS Consider transfer to [facility] if no improvement in oxygenation by 12/4 f/u LE doppler, consider CT angio this week   Hx of PAF on dilt, flecainide Bradycardia Elevated BNP > improved with lasix, unknown cardiac function P:  f/u echo Rate controlled on flecainide,dig &cardizem, seen by cardiology but HR low  Goal slightly net negative fluid balance Continue lasix 60mg  PO daily  Hypokalemia from diuresis  P:   Monitor BMET and UOP Replace electrolytes as needed Continue scheduled potassium oral  A: Hyperglycemia due to steroids  P:   Monitor glucose, SSI for > 180 Diabetic educator consult   FAMILY  - Updates: Patient and  husband updated at bedside 11/29  Summary- while biopsy shows UIP there is inflammation in the biopsy that may be amenable to steroids. Course planned. Will likely also be placed on pirfenidone as an outpt; Chest tubes per TCTS, if no improvement by 12/4 [facility]  Continue steroids as delineated above.  Will not start pirfenidone until outside the hospital.  CT remains in place and will defer to CVTS for management.  If no improvement then [facility] on this week.  Patient seen and examined, agree with above note which was edited in full.  I dictated the care and orders written for this patient under my direction.  Alyson ReedyWesam G Krithik Mapel, MD [phone]

## 2014-02-27 NOTE — Telephone Encounter (Signed)
Called and spoke to pt's spouse, Kristin [name]. Kristin [name] is concerned about pt's medical condition in the hospital. Pt now has BIL chest tubes d/t pneumothorax. Kristin [name] is requesting to speak to BQ personally to discuss the pt's condition and prognosis. Kristin [name] can be reached at [phone] at anytime during the day and evening  Kristin [name](David has appt on 12/2 from 11:30-1p and will be unavailable).  BQ please advise.

## 2014-02-28 ENCOUNTER — Inpatient Hospital Stay (HOSPITAL_COMMUNITY): Payer: BC Managed Care – PPO

## 2014-02-28 DIAGNOSIS — I509 Heart failure, unspecified: Secondary | ICD-10-CM

## 2014-02-28 LAB — CBC
HCT: 46.4 % — ABNORMAL HIGH (ref 36.0–46.0)
Hemoglobin: 14.8 g/dL (ref 12.0–15.0)
MCH: 28 pg (ref 26.0–34.0)
MCHC: 31.9 g/dL (ref 30.0–36.0)
MCV: 87.7 fL (ref 78.0–100.0)
PLATELETS: 147 10*3/uL — AB (ref 150–400)
RBC: 5.29 MIL/uL — ABNORMAL HIGH (ref 3.87–5.11)
RDW: 13.5 % (ref 11.5–15.5)
WBC: 18 10*3/uL — ABNORMAL HIGH (ref 4.0–10.5)

## 2014-02-28 LAB — BASIC METABOLIC PANEL
Anion gap: 10 (ref 5–15)
BUN: 42 mg/dL — AB (ref 6–23)
CALCIUM: 8.2 mg/dL — AB (ref 8.4–10.5)
CO2: 35 mEq/L — ABNORMAL HIGH (ref 19–32)
Chloride: 96 mEq/L (ref 96–112)
Creatinine, Ser: 0.92 mg/dL (ref 0.50–1.10)
GFR, EST AFRICAN AMERICAN: 75 mL/min — AB (ref >90)
GFR, EST NON AFRICAN AMERICAN: 64 mL/min — AB (ref >90)
GLUCOSE: 115 mg/dL — AB (ref 70–99)
Potassium: 4.1 mEq/L (ref 3.7–5.3)
Sodium: 141 mEq/L (ref 137–147)

## 2014-02-28 LAB — GLUCOSE, CAPILLARY
GLUCOSE-CAPILLARY: 185 mg/dL — AB (ref 70–99)
Glucose-Capillary: 81 mg/dL (ref 70–99)
Glucose-Capillary: 135 mg/dL — ABNORMAL HIGH (ref 70–99)
Glucose-Capillary: 158 mg/dL — ABNORMAL HIGH (ref 70–99)

## 2014-02-28 MED ORDER — PIPERACILLIN-TAZOBACTAM 3.375 G IVPB
3.3750 g | Freq: Three times a day (TID) | INTRAVENOUS | Status: DC
  Administered 2014-02-28 – 2014-03-01 (×4): 3.375 g via INTRAVENOUS
  Filled 2014-02-28 (×7): qty 50

## 2014-02-28 MED ORDER — VANCOMYCIN HCL 10 G IV SOLR
2000.0000 mg | INTRAVENOUS | Status: DC
  Filled 2014-02-28: qty 2000

## 2014-02-28 MED ORDER — VANCOMYCIN HCL 10 G IV SOLR
2500.0000 mg | Freq: Once | INTRAVENOUS | Status: AC
  Administered 2014-02-28: 2500 mg via INTRAVENOUS
  Filled 2014-02-28 (×2): qty 2500

## 2014-02-28 NOTE — Telephone Encounter (Signed)
I came by the hospital last night after clinic and updated the family at length.

## 2014-02-28 NOTE — Progress Notes (Signed)
ANTIBIOTIC CONSULT NOTE - INITIAL  Pharmacy Consult for Vancomycin and Zosyn Indication: HCAP  Allergies  Allergen Reactions  . Iodine Hives  . Lisinopril Cough  . Tetanus Toxoids     Hives, welts    Patient Measurements: Height: 5' 5.5" (166.4 cm) Weight: (!) 324 lb 4.8 oz (147.1 kg) IBW/kg (Calculated) : 58.15 Adjusted Body Weight:   Vital Signs: Temp: 98.5 F (36.9 C) (12/02 1100) Temp Source: Oral (12/02 1100) BP: 117/56 mmHg (12/02 0347) Pulse Rate: 57 (12/02 0347) Intake/Output from previous day: 12/01 0701 - 12/02 0700 In: 240 [P.O.:240] Out: 1800 [Urine:1750; Chest Tube:50] Intake/Output from this shift:    Labs:  Recent Labs  02/26/14 0417 02/27/14 0408  WBC 17.1*  --   HGB 15.7*  --   PLT 217  --   CREATININE 1.15* 1.08   Estimated Creatinine Clearance: 77.9 mL/min (by C-G formula based on Cr of 1.08). No results for input(s): VANCOTROUGH, VANCOPEAK, VANCORANDOM, GENTTROUGH, GENTPEAK, GENTRANDOM, TOBRATROUGH, TOBRAPEAK, TOBRARND, AMIKACINPEAK, AMIKACINTROU, AMIKACIN in the last 72 hours.   Microbiology: Recent Results (from the past 720 hour(s))  Surgical pcr screen     Status: None   Collection Time: 02/12/14  1:02 PM  Result Value Ref Range Status   MRSA, PCR NEGATIVE NEGATIVE Final   Staphylococcus aureus NEGATIVE NEGATIVE Final    Comment:        The Xpert SA Assay (FDA approved for NASAL specimens in patients over 64 years of age), is one component of a comprehensive surveillance program.  Test performance has been validated by [facility] for patients greater than or equal to 64 year old. It is not intended to diagnose infection nor to guide or monitor treatment.   Tissue culture     Status: None   Collection Time: 02/14/14  3:35 PM  Result Value Ref Range Status   Specimen Description TISSUE LEFT UPPER LUNG  Final   Special Requests PT ON ZINACEF  Final   Gram Stain   Final    FEW WBC PRESENT, PREDOMINANTLY MONONUCLEAR RARE  SQUAMOUS EPITHELIAL CELLS PRESENT NO ORGANISMS SEEN Performed at [facility]    Culture   Final    NO GROWTH 3 DAYS Performed at [facility]    Report Status 02/18/2014 FINAL  Final  Fungus Culture with Smear     Status: None (Preliminary result)   Collection Time: 02/14/14  3:35 PM  Result Value Ref Range Status   Specimen Description TISSUE LEFT UPPER LUNG  Final   Special Requests PT ON ZINACEF  Final   Fungal Smear   Final    NO YEAST OR FUNGAL ELEMENTS SEEN Performed at [facility]    Culture   Final    CULTURE IN PROGRESS FOR FOUR WEEKS Performed at [facility]    Report Status PENDING  Incomplete  AFB culture with smear     Status: None (Preliminary result)   Collection Time: 02/14/14  3:35 PM  Result Value Ref Range Status   Specimen Description TISSUE LEFT UPPER LEFT LUNG  Final   Special Requests PT ON ZINACEF  Final   Acid Fast Smear   Final    NO ACID FAST BACILLI SEEN Performed at [facility]    Culture   Final    CULTURE WILL BE EXAMINED FOR 6 WEEKS BEFORE ISSUING A FINAL REPORT Performed at [facility]    Report Status PENDING  Incomplete    Medical History: Past Medical  History  Diagnosis Date  . Hypertension   . Hypercholesterolemia   . Fibrocystic breast disease   . Degenerative joint disease   . Atrial fibrillation 2008    seen after knee surgery. in SR on Flecainide since 2012  . GERD (gastroesophageal reflux disease)   . Shortness of breath dyspnea   . Environmental and seasonal allergies   . Sleep apnea     wears CPAP nightly    Medications:  Prescriptions prior to admission  Medication Sig Dispense Refill Last Dose  . aspirin 325 MG EC tablet Take 325 mg by mouth daily.   Past Week at Unknown time  . cetirizine (ZYRTEC) 10 MG tablet Take 10 mg by mouth daily.   02/14/2014 at 0920  . Cholecalciferol (VITAMIN D) 2000 UNITS tablet Take 2,000 Units by mouth daily.   Past Week at  Unknown time  . CRESTOR 10 MG tablet TAKE 1 TABLET EVERY DAY 30 tablet 5 Past Week at Unknown time  . diltiazem (CARDIZEM) 120 MG tablet Take 60 mg by mouth 2 (two) times daily.   02/14/2014 at Unknown time  . flecainide (TAMBOCOR) 150 MG tablet Take 150 mg by mouth 2 (two) times daily.   02/14/2014 at Unknown time  . fluticasone (FLONASE) 50 MCG/ACT nasal spray Place 2 sprays into the nose daily as needed for allergies.    02/14/2014 at Unknown time  . Fluticasone-Salmeterol (ADVAIR DISKUS) 250-50 MCG/DOSE AEPB Inhale 1 puff into the lungs 2 (two) times daily. 60 each 5 02/14/2014 at Unknown time  . meloxicam (MOBIC) 7.5 MG tablet TAKE ONE TABLET DAILY WITH FOOD 30 tablet 3 Past Week at Unknown time  . montelukast (SINGULAIR) 10 MG tablet TAKE ONE TABLET AT BEDTIME 30 tablet 3 Past Week at Unknown time  . omeprazole (PRILOSEC OTC) 20 MG tablet Take 20 mg by mouth daily.   02/14/2014 at Unknown time  . prednisoLONE acetate (PRED FORTE) 1 % ophthalmic suspension Place 1 drop into both eyes at bedtime.   02/13/2014 at Unknown time  . sertraline (ZOLOFT) 50 MG tablet TAKE ONE AND ONE-HALF TABLETS DAILY 45 tablet 3 02/14/2014 at Unknown time  . Respiratory Therapy Supplies (FLUTTER) DEVI daily.   Taking  . Spacer/Aero-Holding Chambers (AEROCHAMBER MV) inhaler Use as instructed 1 each 0 Unknown at Unknown time  . triamterene-hydrochlorothiazide (MAXZIDE-25) 37.5-25 MG per tablet Take 1 tablet by mouth daily. (Patient taking differently: Take 0.5 tablets by mouth daily. ) 30 tablet 5 Unknown at Unknown time   Assessment: 63yof with extensive pulmonary history to start Vancomycin and Zosyn for suspected HCAP. Patient is afebrile and WBC elevated 17.1. Renal function has remained fairly stable during admission. - SCr 1.08, CrCl ~78 (normalized ~59 ml/min) - Wt: 147kg  Goal of Therapy:  Vancomycin trough level 15-20 mcg/ml  Plan:  1. Vancomycin 2.5g IV x 1, then 2g IV q24h 2. Zosyn 3.375g IV q8h -  infuse over 4 hours 3. Monitor renal function, cultures, clinical course and order Vancomycin trough at [facility]  Merrilyn Legler, [place]  [phone] 02/28/2014,12:12 PM

## 2014-02-28 NOTE — Progress Notes (Addendum)
       [address]       [place] [id number]             [phone]          14 Days Post-Op Procedure(s) (LRB): VIDEO ASSISTED THORACOSCOPY (Left) LUNG BIOPSY (Left)  Subjective: Feels a little better today.  No more of the burning pain she described prior to CT placement yesterday.     Objective: Vital signs in last 24 hours: Patient Vitals for the past 24 hrs:  BP Temp Temp src Pulse Resp SpO2  02/28/14 0347 (!) 117/56 mmHg 97.6 F (36.4 C) Oral (!) 57 (!) 21 92 %  02/28/14 0048 135/62 mmHg - - - 19 91 %  02/27/14 2353 135/62 mmHg 98.7 F (37.1 C) Oral 62 18 91 %  02/27/14 2023 - - - 64 (!) 24 90 %  02/27/14 2006 (!) 121/58 mmHg 98.8 F (37.1 C) Oral 70 - (!) 88 %  02/27/14 1627 - - - - - 92 %  02/27/14 1520 (!) 130/59 mmHg - - (!) 57 (!) 28 96 %  02/27/14 1500 - 98.1 F (36.7 C) Oral - - -  02/27/14 1140 - - - - - 92 %  02/27/14 1100 (!) 116/55 mmHg 98.3 F (36.8 C) Oral (!) 55 15 (!) 83 %  02/27/14 1011 - - - (!) 53 15 93 %  02/27/14 0923 (!) 114/47 mmHg - - - - -  02/27/14 0900 - - - 60 16 -  02/27/14 0805 - - - - - 91 %   Current Weight  02/27/14 324 lb 4.8 oz (147.1 kg)     Intake/Output from previous day: 12/01 0701 - 12/02 0700 In: 240 [P.O.:240] Out: 1770 [Urine:1750; Chest Tube:20]  CBGs (551) [phone]    PHYSICAL EXAM:  Heart: RRR, HR 50-60s Lungs: Bilateral crackles Wound: Clean and dry Chest tubes: L=+air leak (1-2 continuous, large with cough), R= no air leak    Lab Results: CBC: Recent Labs  02/26/14 0417  WBC 17.1*  HGB 15.7*  HCT 48.0*  PLT 217   BMET:  Recent Labs  02/26/14 0417 02/27/14 0408  NA 138 139  K 3.5* 3.5*  CL 88* 93*  CO2 35* 36*  GLUCOSE 156* 105*  BUN 57* 55*  CREATININE 1.15* 1.08  CALCIUM 8.6 8.1*    PT/INR: No results for input(s): LABPROT, INR in the last 72 hours.  CXR: small bilateral apical ptxs   Assessment/Plan: S/P Procedure(s) (LRB): VIDEO ASSISTED THORACOSCOPY  (Left) LUNG BIOPSY (Left)  Bilateral pneumothoraces- CXR stable with small apical ptxs.  L CT with stable air leak- will continue CT to suction for now.  R CT with no air leak- may be able to place to water seal soon.  CV- HRs stable, continue current meds.  Hyperglycemia, likely steroid related- continue SSI.  Pulm- UIP, tx per CCM.  She continues to desat with exertion- presently on CPAP and sats 60-70s when talking. Pulm considering tx to [facility].    LOS: 14 days    Carolanne Mercier H 02/28/2014

## 2014-02-28 NOTE — Progress Notes (Signed)
PULMONARY / CRITICAL CARE MEDICINE   Name: Kristin [name] MRN: [id number] DOB: 1950-09-21    ADMISSION DATE:  02/14/2014 CONSULTATION DATE:  11/20  REFERRING MD :  CTS  CHIEF COMPLAINT:  SOB  INITIAL PRESENTATION: 63 yowf morbidly obese remote smoker (quit '85, <15 Pyrs)   followed by Dr. Kendrick Fries for ILD thought to stem from pneumonia in 04/2013 with progressive lung changes resulting in hypoxia. She underwent left VATS 11/18 and on 11/20 was hypoxic , requiring NRB . Treated with lasix and PCCM asked to evaluate.  STUDIES:  May 2015 CT chest> left greater than right interstitial changes throughout the left upper lobe as well as in the left base, question focal areas of bronchiectasis in the lingula, basilar very mild interstitial changes in a peripheral distribution in the right lung overall findings are nonspecific 08/2013 PFT> ratio 74%, FEV1 1.66L (74% pred, 1% change), TLC 2.98L (59% pred), ERV 0.03 (3% pred), DLCO 14.2 (48% pred) 12/2013 ANA neg, Anti Jo-1 neg, CCP neg, RF neg, SSA neg, SSB neg, SCL-70 neg, centromere neg, Hypersensitivity panel neg 12/2013 CBC with diff > 6.6% relative eos, absolute 600/uL 12/2013 PFT> Ratio 75%, FEV1 1.47L (65% pred, 1% change), TLC 2.77L (55% pred), DLCO 11.7 (38% pred) 12/2013 CT chest > progressive ILD changes compared to previous study. New Right sided infiltrate 12/2013 ABG RA> 7.42/44/80/21  11/18 surgical lung biopsy- cellular UIP pattern 11/30 LE doppler > negative for DVT   SIGNIFICANT EVENTS: 11/18 left vats> Frozen c/w ILD > monocellular UIP 11/18 L chest tube > out 11/20  11/21  trx to SDU  11/25 left chest tube reinserted >> 12/1 spontaneous right PTX/ required CT  SUBJECTIVE:   Oxygenation had improved somewhat after chest tube placement  VITAL SIGNS: Temp:  [97.6 F (36.4 C)-98.8 F (37.1 C)] 97.6 F (36.4 C) (12/02 0347) Pulse Rate:  [53-70] 57 (12/02 0347) Resp:  [15-28] 21 (12/02 0347) BP: (114-135)/(47-62)  117/56 mmHg (12/02 0347) SpO2:  [83 %-96 %] 92 % (12/02 0347)  6 liters oximizer  HEMODYNAMICS:      INTAKE / OUTPUT:  Intake/Output Summary (Last 24 hours) at 02/28/14 0736 Last data filed at 02/28/14 0500  Gross per 24 hour  Intake    240 ml  Output   1770 ml  Net  -1530 ml    PHYSICAL EXAMINATION: General:  Morbidly obese female, comfortable in bed HEENT: NCAT, EOMi PULM: crackles bilaterally, air leak on left CV: RRR, no mgr AB: BS+, soft Ext: no edema Neuro: A&OX4, maew  LABS:  CBC  Recent Labs Lab 02/23/14 0530 02/26/14 0417  WBC 12.9* 17.1*  HGB 14.9 15.7*  HCT 46.2* 48.0*  PLT 267 217   Coag's No results for input(s): APTT, INR in the last 168 hours. BMET  Recent Labs Lab 02/25/14 0450 02/26/14 0417 02/27/14 0408  NA 138 138 139  K 3.5* 3.5* 3.5*  CL 91* 88* 93*  CO2 38* 35* 36*  BUN 57* 57* 55*  CREATININE 1.18* 1.15* 1.08  GLUCOSE 151* 156* 105*   Electrolytes  Recent Labs Lab 02/25/14 0450 02/26/14 0417 02/27/14 0408  CALCIUM 8.5 8.6 8.1*   Sepsis Markers No results for input(s): LATICACIDVEN, PROCALCITON, O2SATVEN in the last 168 hours. ABG No results for input(s): PHART, PCO2ART, PO2ART in the last 168 hours. Liver Enzymes  Recent Labs Lab 02/23/14 0530  AST 13  ALT 17  ALKPHOS 67  BILITOT 0.3  ALBUMIN 2.6*   Cardiac Enzymes  Recent Labs  Lab 02/25/14 0450  PROBNP 478.6*   Glucose  Recent Labs Lab 02/26/14 1159 02/26/14 1609 02/26/14 2151 02/27/14 0820 02/27/14 1224 02/27/14 1723  GLUCAP 222* 205* 119* 86 140* 185*    Imaging Dg Chest Port 1 View  02/27/2014   CLINICAL DATA:  Right pneumothorax. Status post chest tube placement.  EXAM: PORTABLE CHEST - 1 VIEW  COMPARISON:  Single view of the chest 02/27/2014 at 6:23 a.m. and 02/26/2014. PA and lateral chest 05/18/2013.  FINDINGS: The patient has a new right chest tube in place. The right lung is nearly completely re-expanded with only a very small  residual pneumothorax identified. Left chest tube is again seen with some decrease in the size of the small left pneumothorax seen on the most recent exam. Right PICC is again noted.  There is cardiomegaly. Bilateral interstitial opacities persist without marked change.  IMPRESSION: Near complete re-expansion of the right lung after chest tube placement.  Slight decrease in small left pneumothorax with a chest tube in place.  Cardiomegaly and interstitial lung disease.   Electronically Signed   By: Drusilla Kannerhomas  Dalessio M.D.   On: 02/27/2014 20:08   Dg Chest Port 1 View  02/27/2014   CLINICAL DATA:  Recent lung biopsy; right-sided lung collapse earlier in the day  EXAM: PORTABLE CHEST - 1 VIEW  COMPARISON:  Study obtained earlier in the day  FINDINGS: There is a new chest tube on the right. The right-sided pneumothorax is considerably smaller following chest tube placement. There does remain small apical and lateral pneumothorax on the right without tension component. On the left, chest tube remains. There is a small apical and lateral pneumothorax on the left, slightly smaller compared to earlier in the day. Central catheter tip is in the superior vena cava near the cavoatrial junction. There is moderate generalized interstitial edema. Heart is enlarged with pulmonary venous hypertension.  There is again noted subcutaneous air on the left inferiorly.  IMPRESSION: Bilateral chest tubes with pneumothoraces bilaterally smaller compared to earlier in the day. Underlying congestive heart failure.   Electronically Signed   By: Bretta BangWilliam  Woodruff M.D.   On: 02/27/2014 10:28   Dg Chest Port 1 View  02/27/2014   CLINICAL DATA:  Pneumothorax.  EXAM: PORTABLE CHEST - 1 VIEW  COMPARISON:  02/26/2014, 02/25/2014, 02/24/2014.  FINDINGS: Left chest tube in stable position. Right PICC line in stable position. Small stable left apical pneumothorax. New right sided approximately 20% pneumothorax. Persistent cardiomegaly and  pulmonary venous congestion. Azygous venous congestion. Diffuse interstitial prominence. These findings suggest congestive heart failure. Basilar atelectasis. Left chest wall subcutaneous emphysema. No acute bony abnormality.  IMPRESSION: 1. New onset right-sided pneumothorax. Pneumothorax approximately 20%.  2. Stable small left apical pneumothorax. Left chest tube in stable position. Mild left chest wall subcutaneous emphysema.  3. Findings suggesting congestive heart failure or pulmonary interstitial edema.  Critical Value/emergent results were called by telephone at the time of interpretation on 02/27/2014 at 7:56 am to nurse [facility],who verbally acknowledged these results.   Electronically Signed   By: Maisie Fushomas  Register   On: 02/27/2014 07:57   Lab Results  Component Value Date   ESRSEDRATE 17 01/08/2014      ASSESSMENT / PLAN:   Acute hypoxic respiratory failure due to UIP, possibly pulm edema ILD post left VATS 11/18- UIP pattern on biopsy with monocellular infiltrate suggestive of underlying connective tissue disease; 11/30 now s/p 10 days moderately high dose solumedrol Left apical pneumothorax OSA New right spontaneous PTX  12/1 required CT placement  Has infiltrate but no fever or significant cough so at this time don't see convincing evidence of pneumonia P:   For ILD: Plan pirfenidone as outpatient Continue prednisone 60mg , plan one week at that dose, then decrease to 40mg  daily until seen in pulmonary clinic If no improvement in oxygenation by 12/4 will consider transfer to [facility], not clear if she should have cytoxan  For hypoxemia Ambulate as able Incentive spirometry, flutter O2 for O2 saturation > 90% > oximizer CPAP qhs CT angio 12/3 if no improvement in oxygenation  For pneumothoraces: Chest tubes per TCTS   Hx of PAF on dilt, flecainide Bradycardia > improved Elevated BNP > improved with lasix, unknown cardiac function P:  f/u echo Rate controlled on  flecainide,dig &cardizem, seen by cardiology but HR low  Goal slightly net negative fluid balance Hold lasix today as net negative 11 L for hospital stay   Hypokalemia from diuresis  P:   Monitor BMET and UOP Replace electrolytes as needed Continue scheduled potassium oral  A: Hyperglycemia due to steroids  P:   Monitor glucose, SSI for > 180 Diabetic educator consult   FAMILY  - Updates: Patient, husband and daughter updated at bedside 11/30, 12/1, 12/2 at length  Summary- while biopsy shows UIP there is inflammation in the biopsy that may be amenable to steroids. Course planned. Will likely also be placed on pirfenidone as an outpt; Chest tubes per TCTS, if no improvement by 12/4 consider [facility] to [facility]  Heber CarolinaBrent Diannia Hogenson, MD [place] PCCM Pager: [phone] Cell: [phone] If no response, call [phone]

## 2014-02-28 NOTE — Progress Notes (Signed)
  Echocardiogram 2D Echocardiogram has been performed.  Kristin [name] 02/28/2014, 9:24 AM

## 2014-02-28 NOTE — Progress Notes (Signed)
[facility] PCCM  Case [facility] with [facility].  They would be willing to take her in transfer by week's end if no improvement.    They recommended a modified barium swallow and empiric antibiotics.    I have updated Earley AbideHilda and her daughter Victorino DikeJennifer.    Will place order for vanc/zosyn to cover HCAP and modified barium swallow.  Heber CarolinaBrent McQuaid, MD [place] PCCM Pager: [phone] Cell: [phone] If no response, call [phone]

## 2014-03-01 ENCOUNTER — Inpatient Hospital Stay (HOSPITAL_COMMUNITY): Payer: BC Managed Care – PPO

## 2014-03-01 LAB — BASIC METABOLIC PANEL
Anion gap: 11 (ref 5–15)
BUN: 35 mg/dL — AB (ref 6–23)
CALCIUM: 8.2 mg/dL — AB (ref 8.4–10.5)
CO2: 32 mEq/L (ref 19–32)
CREATININE: 0.96 mg/dL (ref 0.50–1.10)
Chloride: 99 mEq/L (ref 96–112)
GFR calc Af Amer: 71 mL/min — ABNORMAL LOW (ref >90)
GFR, EST NON AFRICAN AMERICAN: 61 mL/min — AB (ref >90)
GLUCOSE: 110 mg/dL — AB (ref 70–99)
POTASSIUM: 4.3 meq/L (ref 3.7–5.3)
Sodium: 142 mEq/L (ref 137–147)

## 2014-03-01 LAB — CBC
HEMATOCRIT: 45.9 % (ref 36.0–46.0)
Hemoglobin: 14.6 g/dL (ref 12.0–15.0)
MCH: 28 pg (ref 26.0–34.0)
MCHC: 31.8 g/dL (ref 30.0–36.0)
MCV: 88.1 fL (ref 78.0–100.0)
Platelets: 148 10*3/uL — ABNORMAL LOW (ref 150–400)
RBC: 5.21 MIL/uL — ABNORMAL HIGH (ref 3.87–5.11)
RDW: 13.5 % (ref 11.5–15.5)
WBC: 16.5 10*3/uL — ABNORMAL HIGH (ref 4.0–10.5)

## 2014-03-01 LAB — GLUCOSE, CAPILLARY
GLUCOSE-CAPILLARY: 107 mg/dL — AB (ref 70–99)
Glucose-Capillary: 94 mg/dL (ref 70–99)
Glucose-Capillary: 125 mg/dL — ABNORMAL HIGH (ref 70–99)

## 2014-03-01 LAB — PRO B NATRIURETIC PEPTIDE: Pro B Natriuretic peptide (BNP): 687.9 pg/mL — ABNORMAL HIGH (ref 0–125)

## 2014-03-01 MED ORDER — DILTIAZEM HCL 30 MG PO TABS: 30.0000 mg | ORAL_TABLET | Freq: Two times a day (BID) | ORAL | Status: AC

## 2014-03-01 MED ORDER — PREDNISONE 50 MG PO TABS
60.0000 mg | ORAL_TABLET | Freq: Three times a day (TID) | ORAL | Status: DC
  Administered 2014-03-01: 60 mg via ORAL
  Filled 2014-03-01 (×3): qty 1

## 2014-03-01 MED ORDER — ENOXAPARIN SODIUM 80 MG/0.8ML ~~LOC~~ SOLN: 75.0000 mg | SUBCUTANEOUS | Status: AC

## 2014-03-01 MED ORDER — LIDOCAINE HCL (PF) 1 % IJ SOLN: 30.0000 mL | Freq: Once | INTRAMUSCULAR | Status: DC

## 2014-03-01 MED ORDER — TRIAMTERENE-HCTZ 37.5-25 MG PO TABS: 0.5000 | ORAL_TABLET | Freq: Every day | ORAL | Status: AC

## 2014-03-01 MED ORDER — FUROSEMIDE 40 MG PO TABS
40.0000 mg | ORAL_TABLET | Freq: Every day | ORAL | Status: DC
  Administered 2014-03-01: 40 mg via ORAL
  Filled 2014-03-01: qty 1

## 2014-03-01 MED ORDER — POTASSIUM CHLORIDE CRYS ER 20 MEQ PO TBCR
20.0000 meq | EXTENDED_RELEASE_TABLET | Freq: Every day | ORAL | Status: DC
  Administered 2014-03-01: 20 meq via ORAL
  Filled 2014-03-01: qty 1

## 2014-03-01 MED ORDER — MIDAZOLAM HCL 2 MG/2ML IJ SOLN
INTRAMUSCULAR | Status: AC
  Administered 2014-03-01: 2 mg
  Filled 2014-03-01: qty 2

## 2014-03-01 MED ORDER — LEVALBUTEROL HCL 0.63 MG/3ML IN NEBU: 0.6300 mg | INHALATION_SOLUTION | RESPIRATORY_TRACT | Status: AC | PRN

## 2014-03-01 MED ORDER — DIPHENHYDRAMINE HCL 50 MG PO CAPS
50.0000 mg | ORAL_CAPSULE | Freq: Once | ORAL | Status: AC
  Administered 2014-03-01: 50 mg via ORAL
  Filled 2014-03-01: qty 1
  Filled 2014-03-01: qty 2

## 2014-03-01 MED ORDER — PANTOPRAZOLE SODIUM 40 MG PO TBEC: 40.0000 mg | DELAYED_RELEASE_TABLET | Freq: Two times a day (BID) | ORAL | Status: AC

## 2014-03-01 MED ORDER — TRAMADOL HCL 50 MG PO TABS: 50.0000 mg | ORAL_TABLET | Freq: Four times a day (QID) | ORAL | Status: AC | PRN

## 2014-03-01 MED ORDER — BUDESONIDE 0.5 MG/2ML IN SUSP: 0.5000 mg | Freq: Two times a day (BID) | RESPIRATORY_TRACT | Status: AC

## 2014-03-01 MED ORDER — IOHEXOL 350 MG/ML SOLN
100.0000 mL | Freq: Once | INTRAVENOUS | Status: AC | PRN
  Administered 2014-03-01: 80 mL via INTRAVENOUS

## 2014-03-01 MED ORDER — NYSTATIN 100000 UNIT/GM EX POWD
Freq: Two times a day (BID) | CUTANEOUS | Status: DC
  Administered 2014-03-01: 10:00:00 via TOPICAL
  Filled 2014-03-01: qty 15

## 2014-03-01 MED ORDER — LEVALBUTEROL HCL 0.63 MG/3ML IN NEBU: 0.6300 mg | INHALATION_SOLUTION | Freq: Four times a day (QID) | RESPIRATORY_TRACT | Status: AC

## 2014-03-01 MED ORDER — SODIUM CHLORIDE 0.9 % IV SOLN
1500.0000 mg | Freq: Two times a day (BID) | INTRAVENOUS | Status: AC
Start: 2014-03-01 — End: ?

## 2014-03-01 MED ORDER — VANCOMYCIN HCL 10 G IV SOLR
1500.0000 mg | Freq: Two times a day (BID) | INTRAVENOUS | Status: DC
  Administered 2014-03-01: 1500 mg via INTRAVENOUS
  Filled 2014-03-01 (×2): qty 1500

## 2014-03-01 MED ORDER — PIPERACILLIN-TAZOBACTAM 3.375 G IVPB: 3.3750 g | Freq: Three times a day (TID) | INTRAVENOUS | Status: AC

## 2014-03-01 MED ORDER — POTASSIUM CHLORIDE CRYS ER 20 MEQ PO TBCR: 20.0000 meq | EXTENDED_RELEASE_TABLET | Freq: Every day | ORAL | Status: AC

## 2014-03-01 MED ORDER — OXYCODONE HCL 5 MG PO TABS: 5.0000 mg | ORAL_TABLET | ORAL | Status: AC | PRN

## 2014-03-01 MED ORDER — INSULIN ASPART 100 UNIT/ML ~~LOC~~ SOLN: 0.0000 [IU] | Freq: Three times a day (TID) | SUBCUTANEOUS | Status: AC

## 2014-03-01 MED ORDER — FUROSEMIDE 40 MG PO TABS: 40.0000 mg | ORAL_TABLET | Freq: Every day | ORAL | Status: AC

## 2014-03-01 MED ORDER — PREDNISONE 20 MG PO TABS: 60.0000 mg | ORAL_TABLET | Freq: Three times a day (TID) | ORAL | Status: AC

## 2014-03-01 NOTE — Progress Notes (Signed)
[facility] here to transport patient to [facility]. VSS. No complaints at this time. Family at bedside. Belongings taken with patient's family. Will notify [facility] of patient's departure.   Rochele PagesHancock, Jonathan Kirkendoll A, RN

## 2014-03-01 NOTE — Progress Notes (Signed)
ANTIBIOTIC CONSULT NOTE - follow up  Pharmacy Consult for Vancomycin and Zosyn Indication: HCAP  Allergies  Allergen Reactions  . Iodine Hives  . Lisinopril Cough  . Tetanus Toxoids     Hives, welts    Patient Measurements: Height: 5\' 6"  (167.6 cm) Weight: (!) 326 lb 1 oz (147.9 kg) IBW/kg (Calculated) : 59.3   Vital Signs: Temp: 98.2 F (36.8 C) (12/03 0700) Temp Source: Oral (12/03 0700) BP: 117/47 mmHg (12/03 0719) Pulse Rate: 47 (12/03 0719) Intake/Output from previous day: 12/02 0701 - 12/03 0700 In: 1010 [P.O.:960; IV Piggyback:50] Out: 1000 [Urine:1000] Intake/Output from this shift: Total I/O In: 460 [P.O.:360; IV Piggyback:100] Out: 300 [Urine:300]  Labs:  Recent Labs  02/27/14 0408 02/28/14 1127 03/01/14 0600  WBC  --  18.0* 16.5*  HGB  --  14.8 14.6  PLT  --  147* 148*  CREATININE 1.08 0.92 0.96   Estimated Creatinine Clearance: 88.5 mL/min (by C-G formula based on Cr of 0.96). No results for input(s): VANCOTROUGH, VANCOPEAK, VANCORANDOM, GENTTROUGH, GENTPEAK, GENTRANDOM, TOBRATROUGH, TOBRAPEAK, TOBRARND, AMIKACINPEAK, AMIKACINTROU, AMIKACIN in the last 72 hours.   Microbiology: Recent Results (from the past 720 hour(s))  Surgical pcr screen     Status: None   Collection Time: 02/12/14  1:02 PM  Result Value Ref Range Status   MRSA, PCR NEGATIVE NEGATIVE Final   Staphylococcus aureus NEGATIVE NEGATIVE Final    Comment:        The Xpert SA Assay (FDA approved for NASAL specimens in patients over 64 years of age), is one component of a comprehensive surveillance program.  Test performance has been validated by [facility] for patients greater than or equal to 64 year old. It is not intended to diagnose infection nor to guide or monitor treatment.   Tissue culture     Status: None   Collection Time: 02/14/14  3:35 PM  Result Value Ref Range Status   Specimen Description TISSUE LEFT UPPER LUNG  Final   Special Requests PT ON ZINACEF   Final   Gram Stain   Final    FEW WBC PRESENT, PREDOMINANTLY MONONUCLEAR RARE SQUAMOUS EPITHELIAL CELLS PRESENT NO ORGANISMS SEEN Performed at [facility]    Culture   Final    NO GROWTH 3 DAYS Performed at [facility]    Report Status 02/18/2014 FINAL  Final  Fungus Culture with Smear     Status: None (Preliminary result)   Collection Time: 02/14/14  3:35 PM  Result Value Ref Range Status   Specimen Description TISSUE LEFT UPPER LUNG  Final   Special Requests PT ON ZINACEF  Final   Fungal Smear   Final    NO YEAST OR FUNGAL ELEMENTS SEEN Performed at [facility]    Culture   Final    CULTURE IN PROGRESS FOR FOUR WEEKS Performed at [facility]    Report Status PENDING  Incomplete  AFB culture with smear     Status: None (Preliminary result)   Collection Time: 02/14/14  3:35 PM  Result Value Ref Range Status   Specimen Description TISSUE LEFT UPPER LEFT LUNG  Final   Special Requests PT ON ZINACEF  Final   Acid Fast Smear   Final    NO ACID FAST BACILLI SEEN Performed at [facility]    Culture   Final    CULTURE WILL BE EXAMINED FOR 6 WEEKS BEFORE ISSUING A FINAL REPORT Performed at [facility]  Report Status PENDING  Incomplete  Culture, blood (routine x 2)     Status: None (Preliminary result)   Collection Time: 02/28/14 12:55 PM  Result Value Ref Range Status   Specimen Description BLOOD LEFT HAND  Final   Special Requests BOTTLES DRAWN AEROBIC ONLY 1CC  Final   Culture  Setup Time   Final    02/28/2014 17:05 Performed at [facility]    Culture   Final           BLOOD CULTURE RECEIVED NO GROWTH TO DATE CULTURE WILL BE HELD FOR 5 DAYS BEFORE ISSUING A FINAL NEGATIVE REPORT Performed at [facility]    Report Status PENDING  Incomplete  Culture, blood (routine x 2)     Status: None (Preliminary result)   Collection Time: 02/28/14  1:05 PM  Result Value Ref Range Status   Specimen  Description BLOOD LEFT ARM  Final   Special Requests BOTTLES DRAWN AEROBIC ONLY 5CC  Final   Culture  Setup Time   Final    02/28/2014 17:05 Performed at [facility]    Culture   Final           BLOOD CULTURE RECEIVED NO GROWTH TO DATE CULTURE WILL BE HELD FOR 5 DAYS BEFORE ISSUING A FINAL NEGATIVE REPORT Performed at [facility]    Report Status PENDING  Incomplete     Assessment: 63yof with extensive pulmonary history on day #2 Vancomycin and Zosyn for suspected HCAP. Patient is afebrile and WBC 18>16.5 (on prednisone). Creatinine 0.96, creat cl > 60 ml/min. Wt: 147kg  Vanc 12/2>> Zosyn 12/2>>  BCx (12/2)>>ngtd LUL Cx>>NGf  Goal of Therapy:  Vancomycin trough level 15-20 mcg/ml  Plan:  1. Vancomycin 2.5g IV x 1 given yesterday, change maintenance dose to 1.5 g IV q12h per obesity nomogram for creat cl > 60 ml/min. 2. Continue Zosyn 3.375g IV q8h - infuse over 4 hours 3. Monitor renal function, cultures, clinical course and [facility]  Joseph Bias T. Deyani Hegarty, Pharm.D. [phone] 03/01/2014 11:04 AM

## 2014-03-01 NOTE — Progress Notes (Signed)
Getting pt up to bedside commode, noticed rt chest tube making more air sound from site, took dressing down to see chest tube and noticed hole on chest tube exposed externally from insertion site. Paged dr Dorris Fetchhendrickson. Orders rec'd to get chest xray. Will continue to monitor.

## 2014-03-01 NOTE — Progress Notes (Addendum)
PULMONARY / CRITICAL CARE MEDICINE   Name: Kristin [name] MRN: [id number] DOB: January 23, 1951    ADMISSION DATE:  02/14/2014 CONSULTATION DATE:  11/20  REFERRING MD :  CTS  CHIEF COMPLAINT:  SOB  INITIAL PRESENTATION: 63 yowf morbidly obese remote smoker (quit '85, <15 Pyrs)   followed by Dr. Kendrick Fries for ILD thought to stem from pneumonia in 04/2013 with progressive lung changes resulting in hypoxia. She underwent left VATS 11/18 and on 11/20 was hypoxic , requiring NRB . Treated with lasix and PCCM asked to evaluate.  STUDIES:  May 2015 CT chest> left greater than right interstitial changes throughout the left upper lobe as well as in the left base, question focal areas of bronchiectasis in the lingula, basilar very mild interstitial changes in a peripheral distribution in the right lung overall findings are nonspecific 08/2013 PFT> ratio 74%, FEV1 1.66L (74% pred, 1% change), TLC 2.98L (59% pred), ERV 0.03 (3% pred), DLCO 14.2 (48% pred) 12/2013 ANA neg, Anti Jo-1 neg, CCP neg, RF neg, SSA neg, SSB neg, SCL-70 neg, centromere neg, Hypersensitivity panel neg 12/2013 CBC with diff > 6.6% relative eos, absolute 600/uL 12/2013 PFT> Ratio 75%, FEV1 1.47L (65% pred, 1% change), TLC 2.77L (55% pred), DLCO 11.7 (38% pred) 12/2013 CT chest > progressive ILD changes compared to previous study. New Right sided infiltrate 12/2013 ABG RA> 7.42/44/80/21  11/18 surgical lung biopsy- cellular UIP pattern 11/30 LE doppler > negative for DVT 12/2 Echo > LVEF 55%, mild LVH, RV dysfunction but poor imaging of the RV  SIGNIFICANT EVENTS: 11/18 left vats> Frozen c/w ILD > monocellular infiltration with UIP 11/18 L chest tube > out 11/20  11/20 Started on solumedrol IV, diuresis 11/21  trx to SDU  11/25 left chest tube reinserted >> 11/30 solumedrol changed to prednisone 60mg  12/1 lasix held, spontaneous right PTX/ required CT  SUBJECTIVE:   Oxygenation had improved somewhat after chest tube  placement  VITAL SIGNS: Temp:  [97.6 F (36.4 C)-98.5 F (36.9 C)] 98.2 F (36.8 C) (12/03 0700) Pulse Rate:  [55-61] 55 (12/03 0206) Resp:  [17-24] 20 (12/03 0206) BP: (109-142)/(40-61) 115/40 mmHg (12/03 0519) SpO2:  [89 %-94 %] 89 % (12/03 0519) Weight:  [147.9 kg (326 lb 1 oz)-156 kg (343 lb 14.7 oz)] 147.9 kg (326 lb 1 oz) (12/03 0500)  6 liters oximizer  HEMODYNAMICS:      INTAKE / OUTPUT:  Intake/Output Summary (Last 24 hours) at 03/01/14 0819 Last data filed at 02/28/14 1942  Gross per 24 hour  Intake   1010 ml  Output   1000 ml  Net     10 ml    PHYSICAL EXAMINATION: General:  Morbidly obese female, comfortable in bed HEENT: NCAT, EOMi PULM: crackles bilaterally, pleural rub bilaterally CV: RRR, no mgr AB: BS+, soft Ext: no edema Neuro: A&OX4, maew  LABS:  CBC  Recent Labs Lab 02/26/14 0417 02/28/14 1127 03/01/14 0600  WBC 17.1* 18.0* 16.5*  HGB 15.7* 14.8 14.6  HCT 48.0* 46.4* 45.9  PLT 217 147* 148*   Coag's No results for input(s): APTT, INR in the last 168 hours. BMET  Recent Labs Lab 02/27/14 0408 02/28/14 1127 03/01/14 0600  NA 139 141 142  K 3.5* 4.1 4.3  CL 93* 96 99  CO2 36* 35* 32  BUN 55* 42* 35*  CREATININE 1.08 0.92 0.96  GLUCOSE 105* 115* 110*   Electrolytes  Recent Labs Lab 02/27/14 0408 02/28/14 1127 03/01/14 0600  CALCIUM 8.1* 8.2* 8.2*  Sepsis Markers No results for input(s): LATICACIDVEN, PROCALCITON, O2SATVEN in the last 168 hours. ABG No results for input(s): PHART, PCO2ART, PO2ART in the last 168 hours. Liver Enzymes  Recent Labs Lab 02/23/14 0530  AST 13  ALT 17  ALKPHOS 67  BILITOT 0.3  ALBUMIN 2.6*   Cardiac Enzymes  Recent Labs Lab 02/25/14 0450 03/01/14 0600  PROBNP 478.6* 687.9*   Glucose  Recent Labs Lab 02/27/14 1723 02/28/14 0749 02/28/14 1120 02/28/14 1714 02/28/14 2038 03/01/14 0553  GLUCAP 185* 81 135* 185* 158* 107*    Imaging Dg Chest Port 1 View  02/28/2014    CLINICAL DATA:  Screw its malignancy  EXAM: PORTABLE CHEST - 1 VIEW  COMPARISON:  Chest x-ray of February 27, 2014  FINDINGS: The right-sided pneumothorax is not evident today. A tiny apical pneumothorax remains visible on the left. The chest tubes are unchanged. Fluffy alveolar infiltrates persist bilaterally. The cardiac silhouette remains enlarged. The pulmonary vascularity remains engorged. The left hemidiaphragm is largely obscured. The PICC line tip projects over the distal aspect of the SVC.  IMPRESSION: 1. A tiny left apical pneumothorax persists. The right pneumothorax is not evident today. The chest tubes are unchanged in position. 2. Enlargement of the cardiac silhouette and fluffy alveolar infiltrates consistent with pulmonary edema.   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 post left VATS 11/18- UIP pattern on biopsy with monocellular infiltrate suggestive of underlying connective tissue disease; 11/30 now s/p 10 days solumedrol as high as 500mg  daily for 3 days, then 240mg  for several days, tapered to prednisone 60mg  Left apical pneumothorax OSA New right spontaneous PTX 12/1 required CT placement  Has infiltrate but no fever or significant cough so at this time don't see convincing evidence of pneumonia P:   For ILD: Plan pirfenidone as outpatient Continue prednisone 60mg , plan one week at that dose, then decrease to 40mg  daily until seen in pulmonary clinic If no improvement in oxygenation by 12/4 will consider transfer to [facility], not clear if she should have cytoxan or Rituxan  For hypoxemia Ambulate as able Incentive spirometry, flutter O2 for O2 saturation > 90% > oximizer CPAP qhs CT angio 12/3 today with premedication (will received prednisone 60mg  q8h today only per allergy protocol, then plan to  resume 60mg  daily tomorrow 12/4)  For pneumothoraces: Chest tubes per TCTS   Hx of PAF on dilt, flecainide Bradycardia > improved Elevated BNP > improved with lasix, unknown cardiac function P:  f/u echo Continue flecainide and dilt Goal slightly net negative fluid balance Hold lasix   Hypokalemia from diuresis > resolved P:   Monitor BMET and UOP Replace electrolytes as needed Continue scheduled potassium oral  A: Hyperglycemia due to steroids  P:   Monitor glucose, SSI for > 180 Diabetic educator consult   FAMILY  - Updates: Patient, husband and daughter updated at bedside 11/30, 12/1, 12/2 and 12/3 at length  Summary- while biopsy shows UIP there is inflammation in the biopsy that may be amenable to steroids. Course planned. Will likely also be placed on pirfenidone as an outpt; Chest tubes per TCTS,  CT angio today to rule out PE Will start [facility], Dr. Cecille PoGovert  Brent Geana Walts, MD [place] PCCM Pager: [phone]  Cell: [phone] If no response, call [phone]

## 2014-03-01 NOTE — Progress Notes (Signed)
[facility] transferring to [facility] via [facility] with belongings. Family made aware of new room number 7808.

## 2014-03-01 NOTE — Progress Notes (Addendum)
[address]       [place] [address]             [phone]          15 Days Post-Op Procedure(s) (LRB): VIDEO ASSISTED THORACOSCOPY (Left) LUNG BIOPSY (Left)  Subjective: Comfortable, no new issues.   Objective: Vital signs in last 24 hours: Patient Vitals for the past 24 hrs:  BP Temp Temp src Pulse Resp SpO2 Height Weight  03/01/14 0519 (!) 115/40 mmHg 97.6 F (36.4 C) Oral - - (!) 89 % - -  03/01/14 0500 - - - - - - - (!) 326 lb 1 oz (147.9 kg)  03/01/14 0206 (!) 142/61 mmHg 98.1 F (36.7 C) Oral (!) 55 20 92 % - -  02/28/14 2354 - - - (!) 58 18 90 % - -  02/28/14 1952 - - - - - 94 % - -  02/28/14 1947 (!) 112/47 mmHg 98.5 F (36.9 C) Oral (!) 59 (!) 23 90 % - -  02/28/14 1825 - - - - - - 5\' 6"  (1.676 m) (!) 343 lb 14.7 oz (156 kg)  02/28/14 1715 (!) 120/57 mmHg 98.2 F (36.8 C) Oral 61 (!) 24 90 % - -  02/28/14 1120 (!) 109/52 mmHg - - 60 17 90 % - -  02/28/14 1100 - 98.5 F (36.9 C) Oral - - - - -   Current Weight  03/01/14 326 lb 1 oz (147.9 kg)     Intake/Output from previous day: 12/02 0701 - 12/03 0700 In: 1010 [P.O.:960; IV Piggyback:50] Out: 1000 [Urine:1000]    PHYSICAL EXAM:  Heart: RRR Lungs: Bilateral crackles Wound: Clean and dry Chest tubes: L= 1-2/7 continuous leak which increases with cough, R=no definite air leak    Lab Results: CBC: Recent Labs  02/28/14 1127 03/01/14 0600  WBC 18.0* 16.5*  HGB 14.8 14.6  HCT 46.4* 45.9  PLT 147* 148*   BMET:  Recent Labs  02/28/14 1127 03/01/14 0600  NA 141 142  K 4.1 4.3  CL 96 99  CO2 35* 32  GLUCOSE 115* 110*  BUN 42* 35*  CREATININE 0.92 0.96  CALCIUM 8.2* 8.2*    PT/INR: No results for input(s): LABPROT, INR in the last 72 hours.   Echo: Study Conclusions  - Left ventricle: Technically very limited study. The EF is in the 55% range. Wall thickness was increased in a pattern of mild LVH. Images were inadequate for LV wall motion  assessment. - Right ventricle: There is suggestion of some RV dysfunction. Not able to assess RV function due to poor visualization. Not able to assess RV size due to poor visualization.  Transthoracic echocardiography. M-mode, complete 2D, spectral Doppler, and color Doppler. Birthdate: Patient birthdate: [birth year]. Age: Patient is 63 yr old. Sex: Gender: female. BMI: 52.9 kg/m^2. Blood pressure:   144/48 Patient status: Inpatient. Study date: Study date: 02/28/2014. Study time: 08:42 AM. Location: ICU/CCU  -------------------------------------------------------------------  ------------------------------------------------------------------- Left ventricle: Technically very limited study. The EF is in the 55% range. Wall thickness was increased in a pattern of mild LVH. Images were inadequate for LV wall motion assessment.  ------------------------------------------------------------------- Aortic valve:  Structurally normal valve.  Cusp separation was normal. Doppler: Transvalvular velocity was within the normal range. There was no stenosis. There was no regurgitation.  ------------------------------------------------------------------- Aorta: Aortic root: The aortic root was normal in size.  ------------------------------------------------------------------- Mitral valve:  Structurally normal valve.  Leaflet separation was normal.  Doppler: Transvalvular velocity was within the normal range. There was no evidence for stenosis. There was no regurgitation.  Peak gradient (D): 2 mm Hg.  ------------------------------------------------------------------- Left atrium: The atrium was at the upper limits of normal in size.  ------------------------------------------------------------------- Right ventricle: There is suggestion of some RV dysfunction. Not able to assess RV function due to poor visualization. Not able to assess RV size due to poor  visualization.  ------------------------------------------------------------------- Pulmonary artery:  Poorly visualized.  ------------------------------------------------------------------- Right atrium: Poorly visualized.  ------------------------------------------------------------------- Pericardium: There was no pericardial effusion.  ------------------------------------------------------------------- Measurements  Left ventricle              Value    Reference LV ID, ED, PLAX chordal         49  mm   43 - 52 LV ID, ES, PLAX chordal         34  mm   23 - 38 LV fx shortening, PLAX chordal      31  %   >=29 LV PW thickness, ED           13  mm   --------- IVS/LV PW ratio, ED           0.69     <=1.3 LV e&', lateral              5.55 cm/s  --------- LV E/e&', lateral             13.17    --------- LV e&', medial              5.66 cm/s  --------- LV E/e&', medial             12.92    --------- LV e&', average              5.61 cm/s  --------- LV E/e&', average             13.04    ---------  Ventricular septum            Value    Reference IVS thickness, ED            9   mm   ---------  Aorta                  Value    Reference Aortic root ID, ED            27  mm   ---------  Left atrium               Value    Reference LA ID, A-P, ES              30  mm   --------- LA ID/bsa, A-P              1.1  cm/m^2 <=2.2 LA volume, S               62.8 ml   --------- LA volume/bsa, S             23.1 ml/m^2 --------- LA volume, ES, 1-p A4C          63.4 ml   --------- LA volume/bsa, ES, 1-p A4C        23.3 ml/m^2  --------- LA volume, ES, 1-p A2C          55.3 ml   --------- LA volume/bsa, ES, 1-p A2C        20.3 ml/m^2 ---------  Mitral valve  Value    Reference Mitral E-wave peak velocity       73.1 cm/s  --------- Mitral A-wave peak velocity       62.1 cm/s  --------- Mitral deceleration time         204  ms   150 - 230 Mitral peak gradient, D         2   mm Hg --------- Mitral E/A ratio, peak          1.2     ---------  Right ventricle             Value    Reference RV s&', lateral, S            14.5 cm/s  ---------  Legend: (L) and (H) mark values outside specified reference range.  ------------------------------------------------------------------- Prepared and Electronically Authenticated by  Willa RoughJeffrey Katz, MD 2015-12-02T10:37:46   CXR: FINDINGS: On the right a tiny pneumothorax is again visible amounting to 5% or less of the lung volume. The tip of the right-sided chest tube overlies the posterior lateral aspect of the left fourth rib. On the left a small pneumothorax (5%) is visible and not greatly changed. The tip of the left chest tube overlies the posterior aspect of the fourth rib. There is a small amount of subcutaneous emphysema on the left which is stable.  The lung volumes remain decreased. The interstitial markings of both lungs remain increased. The left hemidiaphragm remains obscured. The cardiac silhouette remains enlarged. The central pulmonary vascularity is mildly prominent but stable. The right-sided PICC line tip projects over the cavoatrial junction.  IMPRESSION: Tiny bilateral pneumothoraces are again demonstrated with. These amount to 5% or less of the lung volume. The bilateral chest tubes are stable in appearance. The chest is otherwise unchanged.  Assessment/Plan: S/P Procedure(s) (LRB): VIDEO  ASSISTED THORACOSCOPY (Left) LUNG BIOPSY (Left) CTs with unchanged air leak on left, no definite leak on R but does have some tidaling with cough.  Continue CTs to suction for now. Spoke with Dr. Kendrick FriesMcQuaid - CT angio for today to R/O PE.  He has contacted the pulmonary service at [facility] and they will accept the patient in transfer when bed is available for further treatment of her UIP.   LOS: 15 days    COLLINS,GINA H 03/01/2014  Patient seen and examined, agree with above Not making progress with steroids BNP a little higher today- will start on PO lasix, but I don't think that is a major issue here

## 2014-03-01 NOTE — Progress Notes (Signed)
0530 pt was transferred to bsc and pox dropped to 52%. Pt was looking cyanotic and feeling light headed. Pt was taken off C-pap and placed on non-rebreather. Pt sats returned to 88%. Pt was returned to bed. She stated that she feels much better. Will continue to monitor

## 2014-03-01 NOTE — ED Notes (Signed)
Assisted RN with completion of EMTALA paperwork and tx packet.  [facility] called and transport arranged.

## 2014-03-01 NOTE — Procedures (Signed)
Kristin [name]'s right chest tube was partially pulled out earlier today. There was a side hole visible, so the tube was removed.  Her follow up CXR was not significantly changed. However, She has audible air expelled from incision with coughing. She needs a new chest tube placed. She is aware of the risks and benefits.  2 mg versed IV. Using sterile technique and 1% lidocaine(25 ml) local anesthesia, a 28 F CT was placed into the right pleural space. + rush of air. Small air leak when connected to pleur-evac. Tolerated well. CXR pending

## 2014-03-01 NOTE — Progress Notes (Signed)
2230 Pt was ambulated around the unit. Pt was placed on the non- rebreather.  At start of the ambulation pt pox 98%. Pt ambulated about 14250ft and pox dropped in to the 70% and sustained. Pt stopped and did some deep breathing pox returned to high 80's. Pt was returned to room. Sat back in chair. Pox returned to 92%. Pt placed back on oxymizer at 6l. Will continue to monitor pt status.

## 2014-03-01 NOTE — Discharge Summary (Signed)
[address]       [place]rist[place] [name] [address]             [phone]              Discharge Summary  Name: Kristin [name] DOB: 1950/06/27 63 y.o. 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 Hypertension   . Hypercholesterolemia   . Fibrocystic breast disease   . Degenerative joint disease   . Atrial fibrillation 2008    seen after knee surgery. in SR on Flecainide since 2012  . GERD (gastroesophageal reflux disease)   . Shortness of breath dyspnea   . Environmental and seasonal allergies   . Sleep apnea     wears CPAP nightly   Morbid Obesity with BMI 50.0-59.9    Procedures: LEFT VIDEO ASSISTED THORACOSCOPY - 02/14/2014  LEFT UPPER LOBE BIOPSY x 1  LEFT LOWER LOBE BIOPSY x 2  LEFT CHEST TUBE PLACEMENT - 02/21/2014  RIGHT CHEST TUBE PLACEMENT - 02/27/2014     HPI:  The patient is a 63 y.o. female with a history of atrial fibrillation, reflux, obstructive sleep apnea, and morbid obesity. She had no pre-existing lung disease prior to February 2015. In February 2015, she had the flu, which progressed to a community-acquired pneumonia. She was treated with antibiotics. Her fever improved, but she continued to have cough and shortness of breath with exertion. This cough was primarily dry, although she has had some productive cough. She has not had any hemoptysis. She returned to Dr. Lorin [name], her primary MD, for further evaluation of her ongoing symptoms. A chest x-ray showed likely fibrosis in comparison to the infiltrate  which was noted in February. A CT of the chest showed evidence of pulmonary fibrosis and bronchiectasis primarily affecting the left lung.  She was referred to Dr. Kendrick [name] for pulmonary consultation. He felt the changes on CT were consistent with interstitial lung disease. She was treated with Advair and CPAP, as well as a flutter valve.  About 3-4 weeks prior to this admission, she had a increasingly productive cough and runny nose. She was treated with Augmentin and prednisone. She did improve with that to some degree. The productive cough stopped, but she continued to have a chronic dry cough, and her shortness of breath remained unchanged. She gets short of breath with minimal walking on level ground or just a few steps. Serum eosinophils were elevated on 2 specimens, but CT findings were not thought to be consistent with chronic eosinophilic pneumonia.  It was felt since she did not have significant improvement with steroid therapy, that the best course of action would be to proceed with open lung biopsy.   The patient was referred to Dr. Dorris [name] for thoracic surgical consultation.  He reviewed her studies and agreed that she would benefit from diagnostic thoracoscopic lung biopsy.  All risks, benefits and alternatives of surgery were explained in detail, and the patient agreed to proceed.    Hospital Course:  The patient was admitted to [facility] on 02/14/2014. The patient was taken to the operating room and underwent the above procedure.    The postoperative course has been  notable for hypoxia which was treated initially with diuresis and aggressive pulmonary toilet measures.  Pulmonary Critical Care was enlisted to assist with management.  The patient was started on IV steroids and nebulized bronchodilators. Pathology revealed monocellular UIP pattern and the patient was continued on high dose steroids, which were tapered per pulmonology. She was transitioned to po prednisone on 11/30, with  plans to continue tapering slowly.   Her chest tubes were removed on postop day 3, however, she developed a recurrent left pneumothorax and required chest tube placement on 02/21/2014. An attempt was made to place her tube to water seal after improvement was noted on chest x-ray, but this resulted in an increase in her pneumothorax, and -20 cm suction was replaced.  She has had a persistent air leak while on suction. On 02/27/2014, a new large right pneumothorax was noted on chest x-ray, and a right chest tube was placed.  Both tubes have remained to suction, and the left side has had a persistent air leak.  The right sided leak resolved, but on 12/3, the chest tube was inadvertently pulled out when the patient got out of bed.  A follow up chest x-ray remained stable with small, stable bilateral pneumothoraces.  From a cardiac standpoint, the patient was noted to have sinus bradycardia with heart rates in the 40s.  Cardiology was consulted, as they had recently seen the patient for symptomatic bradycardia and history of atrial fibrillation.  It was felt that she should remain on flecainide, as this had worked well for her in the past, and she was started on a low dose of Diltiazem and digoxin.  She had a brief run of rate controlled atrial fibrillation on 11/27, but otherwise has remained in sinus bradycardia. She continued to have bradycardia down into the 40s and Digoxin was ultimately discontinued. She was felt to have a component of acute diastolic heart failure and was diuresed with IV Lasix, to which has she responded well.  Pro-BNP was initially elevated at 3278, but improved to 478.6 with diuresis. Lasix was discontinued, but BNP bumped up to 687.0 on 12/3 and Lasix was resumed.  A 2D echocardiogram was performed on 12/2 and revealed EF of 55% with increased LV wall thickness in a pattern of mild LVH. There was suggestion of some RV dysfunction. The study was technically limited.  Despite all therapy,  the patient's pulmonary status remains tenuous.  She desaturates significantly with any exertion, requiring a non-rebreather mask. She remains afebrile but has had a mild leukocytosis, which is thought to be related to steroids.  She was started on empiric antibiotics, although she has no concrete clinical evidence of pneumonia.   Chest x-rays have shown infiltrate, but she has remained afebrile.  She has completed 10 days of IV Solumedrol (500 mg daily x 3 days, then 240 mg daily for 3 days) and has been transitioned to po Prednisone 60 mg daily.  A lower extremity Doppler study was negative for DVT.  A CT angiogram on 12/3 revealed no evidence of pulmonary embolus, with mild to moderate cardiomegaly and diffuse airspace disease likely due to pulmonary edema, and tiny bilateral pneumothoraces.    It is felt that the patient would benefit from transfer to [facility] under the care of their pulmonary service for further management of her complicated pulmonary issues. Arrangements have been made with Dr. Katrine CohoGovert, and the patient is presently stable for transfer once a bed is available.     Recent vital  signs:  Filed Vitals:   03/01/14 1210  BP: 111/60  Pulse: 63  Temp: 98.2 F (36.8 C)  Resp: 29    Recent laboratory studies:  CBC:  Recent Labs  02/28/14 1127 03/01/14 0600  WBC 18.0* 16.5*  HGB 14.8 14.6  HCT 46.4* 45.9  PLT 147* 148*   BMET:   Recent Labs  02/28/14 1127 03/01/14 0600  NA 141 142  K 4.1 4.3  CL 96 99  CO2 35* 32  GLUCOSE 115* 110*  BUN 42* 35*  CREATININE 0.92 0.96  CALCIUM 8.2* 8.2*    PT/INR: No results for input(s): LABPROT, INR in the last 72 hours.   PERTINENT STUDIES:  May 2015 CT chest : left greater than right interstitial changes throughout the left upper lobe as well as in the left base, question focal areas of bronchiectasis in the lingula, basilar very mild interstitial changes in a peripheral distribution in the right lung  overall findings are nonspecific 08/2013 PFT: ratio 74%, FEV1 1.66L (74% pred, 1% change), TLC 2.98L (59% pred), ERV 0.03 (3% pred), DLCO 14.2 (48% pred) 12/2013: ANA neg, Anti Jo-1 neg, CCP neg, RF neg, SSA neg, SSB neg, SCL-70 neg, centromere neg, Hypersensitivity panel neg 12/2013: CBC with diff > 6.6% relative eos, absolute 600/uL 12/2013: PFT> Ratio 75%, FEV1 1.47L (65% pred, 1% change), TLC 2.77L (55% pred), DLCO 11.7 (38% pred) 12/2013:  CT chest > progressive ILD changes compared to previous study. New Right sided infiltrate 12/2013:  ABG RA> 7.42/44/80/21  11/18 surgical lung biopsy- cellular UIP pattern 11/30 LE doppler: negative for DVT 12/2 Echo: LVEF 55%, mild LVH, RV dysfunction but poor imaging of the RV  SIGNIFICANT EVENTS: 11/18 left vats: Frozen c/w ILD > monocellular infiltration with UIP 11/18 L chest tube removed 11/20  11/20 Started on solumedrol IV, diuresis 11/21 trx to SDU  11/25 new left chest tube inserted  11/30 solumedrol changed to prednisone 60mg  12/1 lasix held, spontaneous right PTX, required R chest tube   Radiographic Findings: CT Angiogram (03/01/2014) EXAM: CT ANGIOGRAPHY CHEST WITH CONTRAST  TECHNIQUE: Multidetector CT imaging of the chest was performed using the standard protocol during bolus administration of intravenous contrast. Multiplanar CT image reconstructions and MIPs were obtained to evaluate the vascular anatomy.  CONTRAST: 80mL OMNIPAQUE IOHEXOL 350 MG/ML SOLN  COMPARISON: Chest radiograph on 03/01/2014  FINDINGS: Vascular/Cardiac: Satisfactory opacification of pulmonary arteries is demonstated, and no pulmonary emboli are identified. No evidence of thoracic aortic aneurysm or other significant abnormality. Mild to moderate cardiomegaly noted.  Mediastinum/Hilar Regions: No masses or pathologically enlarged lymph nodes identified.  Lungs: Diffuse heterogeneous airspace opacity is seen bilaterally, likely due to  diffuse pulmonary edema. No focal consolidation or mass visualized.  Pleura: Tiny bilateral pneumothoraces and subcutaneous chest wall emphysema again demonstrated. Left-sided chest tube remains in place.  Musculoskeletal: No suspicious bone lesions identified.  Other: None.  Review of the MIP images confirms the above findings.  IMPRESSION: No evidence of pulmonary embolism.  Mild to moderate cardiomegaly, and diffuse bilateral airspace disease likely due to pulmonary edema.  Tiny bilateral pneumothoraces, with left chest tube remaining in place.   Chest x-ray (03/01/2014): FINDINGS: Right-sided chest tube has been removed. There is a small right pneumothorax. Left chest tube is in place, unchanged with a small left pneumothorax.  PICC tip is unchanged at the cavoatrial junction.  Patchy bilateral infiltrates persist. No discrete effusions. There is cardiomegaly with  IMPRESSION: Small bilateral pneumothoraces. Right chest tube has been removed.  Discharge Medications:     Medication List    STOP taking these medications        AEROCHAMBER MV inhaler     Fluticasone-Salmeterol 250-50 MCG/DOSE Aepb  Commonly known as:  ADVAIR DISKUS     FLUTTER Devi     omeprazole 20 MG tablet  Commonly known as:  PRILOSEC OTC      TAKE these medications        aspirin 325 MG EC tablet  Take 325 mg by mouth daily.     budesonide 0.5 MG/2ML nebulizer solution  Commonly known as:  PULMICORT  Take 2 mLs (0.5 mg total) by nebulization 2 (two) times daily.     cetirizine 10 MG tablet  Commonly known as:  ZYRTEC  Take 10 mg by mouth daily.     CRESTOR 10 MG tablet  Generic drug:  rosuvastatin  TAKE 1 TABLET EVERY DAY     diltiazem 30 MG tablet  Commonly known as:  CARDIZEM  Take 1 tablet (30 mg total) by mouth 2 (two) times daily.     enoxaparin 80 MG/0.8ML injection  Commonly known as:  LOVENOX  Inject 0.75 mLs (75 mg total) into the skin daily.      flecainide 150 MG tablet  Commonly known as:  TAMBOCOR  Take 150 mg by mouth 2 (two) times daily.     fluticasone 50 MCG/ACT nasal spray  Commonly known as:  FLONASE  Place 2 sprays into the nose daily as needed for allergies.     furosemide 40 MG tablet  Commonly known as:  LASIX  Take 1 tablet (40 mg total) by mouth daily.     insulin aspart 100 UNIT/ML injection  Commonly known as:  novoLOG  Inject 0-20 Units into the skin 3 (three) times daily with meals.     levalbuterol 0.63 MG/3ML nebulizer solution  Commonly known as:  XOPENEX  Take 3 mLs (0.63 mg total) by nebulization every 3 (three) hours as needed for wheezing or shortness of breath.     levalbuterol 0.63 MG/3ML nebulizer solution  Commonly known as:  XOPENEX  Take 3 mLs (0.63 mg total) by nebulization 4 (four) times daily.     meloxicam 7.5 MG tablet  Commonly known as:  MOBIC  TAKE ONE TABLET DAILY WITH FOOD     montelukast 10 MG tablet  Commonly known as:  SINGULAIR  TAKE ONE TABLET AT BEDTIME     oxyCODONE 5 MG immediate release tablet  Commonly known as:  Oxy IR/ROXICODONE  Take 1-2 tablets (5-10 mg total) by mouth every 4 (four) hours as needed for severe pain.     pantoprazole 40 MG tablet  Commonly known as:  PROTONIX  Take 1 tablet (40 mg total) by mouth 2 (two) times daily before a meal.     piperacillin-tazobactam 3.375 GM/50ML IVPB  Commonly known as:  ZOSYN  Inject 50 mLs (3.375 g total) into the vein every 8 (eight) hours.     potassium chloride SA 20 MEQ tablet  Commonly known as:  K-DUR,KLOR-CON  Take 1 tablet (20 mEq total) by mouth daily.     prednisoLONE acetate 1 % ophthalmic suspension  Commonly known as:  PRED FORTE  Place 1 drop into both eyes at bedtime.     predniSONE 20 MG tablet  Commonly known as:  DELTASONE  Take 3 tablets (60 mg total) by mouth every 8 (eight) hours.     sertraline 50 MG tablet  Commonly known  as:  ZOLOFT  TAKE ONE AND ONE-HALF TABLETS DAILY      traMADol 50 MG tablet  Commonly known as:  ULTRAM  Take 1-2 tablets (50-100 mg total) by mouth every 6 (six) hours as needed (mild pain).     triamterene-hydrochlorothiazide 37.5-25 MG per tablet  Commonly known as:  MAXZIDE-25  Take 0.5 tablets by mouth daily.     vancomycin 1,500 mg in sodium chloride 0.9 % 500 mL  Inject 1,500 mg into the vein every 12 (twelve) hours.     Vitamin D 2000 UNITS tablet  Take 2,000 Units by mouth daily.         Discharge Instructions:  Incisions may be cleaned daily with soap and water. Continue regular diet. May ambulate daily. Please continue left chest tube to -20 cm suction. Please continue CBGs qac and qhs while on steroids.   COLLINS,GINA H 03/01/2014, 1:18 PM

## 2014-03-01 NOTE — Evaluation (Signed)
Clinical/Bedside Swallow Evaluation Patient Details  Name: Geni BersHilda J Moorer MRN: [id number] Date of Birth: 01/10/1951  Today's Date: 03/01/2014 Time: 1500-1513 SLP Time Calculation (min) (ACUTE ONLY): 13 min  Past Medical History:  Past Medical History  Diagnosis Date  . Hypertension   . Hypercholesterolemia   . Fibrocystic breast disease   . Degenerative joint disease   . Atrial fibrillation 2008    seen after knee surgery. in SR on Flecainide since 2012  . GERD (gastroesophageal reflux disease)   . Shortness of breath dyspnea   . Environmental and seasonal allergies   . Sleep apnea     wears CPAP nightly   Past Surgical History:  Past Surgical History  Procedure Laterality Date  . Tonsillectomy    . Cholecystectomy  2003  . Replacement total knee N/A 2004    Dr Erin SonsHarold [facility]  . Replacement total knee  12/08    left  . 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 Video assisted thoracoscopy Left 02/14/2014    Procedure: VIDEO ASSISTED THORACOSCOPY;  Surgeon: Loreli SlotSteven C Hendrickson, MD;  [facility];  Service: Thoracic;  Laterality: Left;  . Lung biopsy Left 02/14/2014    Procedure: LUNG BIOPSY;  Surgeon: Loreli SlotSteven C Hendrickson, MD;  [facility];  Service: Thoracic;  Laterality: Left;   HPI:  63 y.o. female with a history of atrial fibrillation, reflux, obstructive sleep apnea, and morbid obesity.  Dx ILD thought to stem from pneumonia in 04/2013 with progressive lung changes resulting in hypoxia.  Underwent left VATS 11/18 and on 11/20 was hypoxic , requiring NRB; multiple chest tubes; Dx include acute hypoxic resp failure due to UIP with flare?, left apical pneumothorax, new spontaneous PTX 12/1; has infiltrate but no fever/cough so no convincing signs of pna.  [facility] next 24 hours.  Swallow eval ordered 02/28/14.    Assessment / Plan / Recommendation Clinical Impression  Pt presents with functional oropharyngeal swallow.  There  is active mastication, adequate coordination of swallow/respiration with appropriate exhalation post-swallow (RR in low 20s-within parameters of normal swallow/resp sequence), no s/s of aspiration - even when taxed with large, successive thin-liquid boluses.  Doubt aspiration as a contributing factor nor an iatrogenicity of pt's condition.   Continue current diet.  SLP to sign off.     Aspiration Risk  Mild    Diet Recommendation Regular;Thin liquid   Liquid Administration via: Cup;Straw Medication Administration: Whole meds with liquid Supervision: Patient able to self feed       Follow Up Recommendations  None    Swallow Study Prior Functional Status       General Date of Onset: 02/14/14 HPI: 63 y.o. female with a history of atrial fibrillation, reflux, obstructive sleep apnea, and morbid obesity.  Dx ILD thought to stem from pneumonia in 04/2013 with progressive lung changes resulting in hypoxia.  Underwent left VATS 11/18 and on 11/20 was hypoxic , requiring NRB; multiple chest tubes; Dx include acute hypoxic resp failure due to UIP with flare?, left apical pneumothorax, new spontaneous PTX 12/1; has infiltrate but no fever/cough so no convincing signs of pna.  [facility].  Swallow eval ordered 02/28/14.  Type of Study: Bedside swallow evaluation Previous Swallow Assessment: none per records Diet Prior to this Study: Regular;Thin liquids Temperature Spikes Noted: No Respiratory Status: Nasal cannula Behavior/Cognition: Alert;Cooperative;Pleasant mood Oral Cavity - Dentition: Adequate natural dentition Self-Feeding Abilities: Able to feed self  Patient Positioning: Upright in bed Baseline Vocal Quality: Clear Volitional Cough: Strong Volitional Swallow: Able to elicit    Oral/Motor/Sensory Function Overall Oral Motor/Sensory Function: Appears within functional limits for tasks assessed   Ice Chips Ice chips: Not tested   Thin  Liquid Thin Liquid: Within functional limits Presentation: Cup;Straw    Nectar Thick Nectar Thick Liquid: Not tested   Honey Thick Honey Thick Liquid: Not tested   Puree Puree: Within functional limits Presentation: Spoon   Solid  Clinton Wahlberg L. Dil[place]outure, [place] CCC/SLP Pager [phone]     Solid: Within functional limits Presentation: Self Fed       Blenda MountsCouture, Orianna Biskup Laurice 03/01/2014,3:36 PM

## 2014-03-01 NOTE — Progress Notes (Signed)
Utilization review completed.  

## 2014-03-02 LAB — GLUCOSE, CAPILLARY: Glucose-Capillary: 228 mg/dL — ABNORMAL HIGH (ref 70–99)

## 2014-03-06 LAB — CULTURE, BLOOD (ROUTINE X 2)
CULTURE: NO GROWTH
Culture: NO GROWTH

## 2014-03-06 NOTE — Telephone Encounter (Signed)
[email or web address]

## 2014-03-14 LAB — FUNGUS CULTURE W SMEAR: Fungal Smear: NONE SEEN

## 2014-03-14 NOTE — Telephone Encounter (Signed)
Spoke with BQ about this pt.  [facility]  Will sign off message and hold onto esbriet forms until she is able to sign forms.

## 2014-03-16 ENCOUNTER — Telehealth: Payer: Self-pay | Admitting: Pulmonary Disease

## 2014-03-16 NOTE — Telephone Encounter (Signed)
I discussed Kristin [name]'s situation with her husband [name] at length today.  Sadly she has continued to worsen since [facility].  They are moving towards a comfort measures only approach.

## 2014-03-29 LAB — AFB CULTURE WITH SMEAR (NOT AT ARMC): ACID FAST SMEAR: NONE SEEN

## 2014-03-30 DEATH — deceased

## 2014-05-02 ENCOUNTER — Encounter: Payer: BC Managed Care – PPO | Admitting: Internal Medicine

## 2014-07-13 ENCOUNTER — Encounter: Payer: Self-pay | Admitting: Pulmonary Disease

## 2014-08-09 ENCOUNTER — Encounter: Payer: Self-pay | Admitting: Internal Medicine

## 2015-10-05 IMAGING — CR DG CHEST 1V PORT
1 series · 1 of 1 positions shown · non-contrast
Comparison: 02/21/2014

CLINICAL DATA: Left chest tube

EXAM:
PORTABLE CHEST - 1 VIEW

[ap portable]
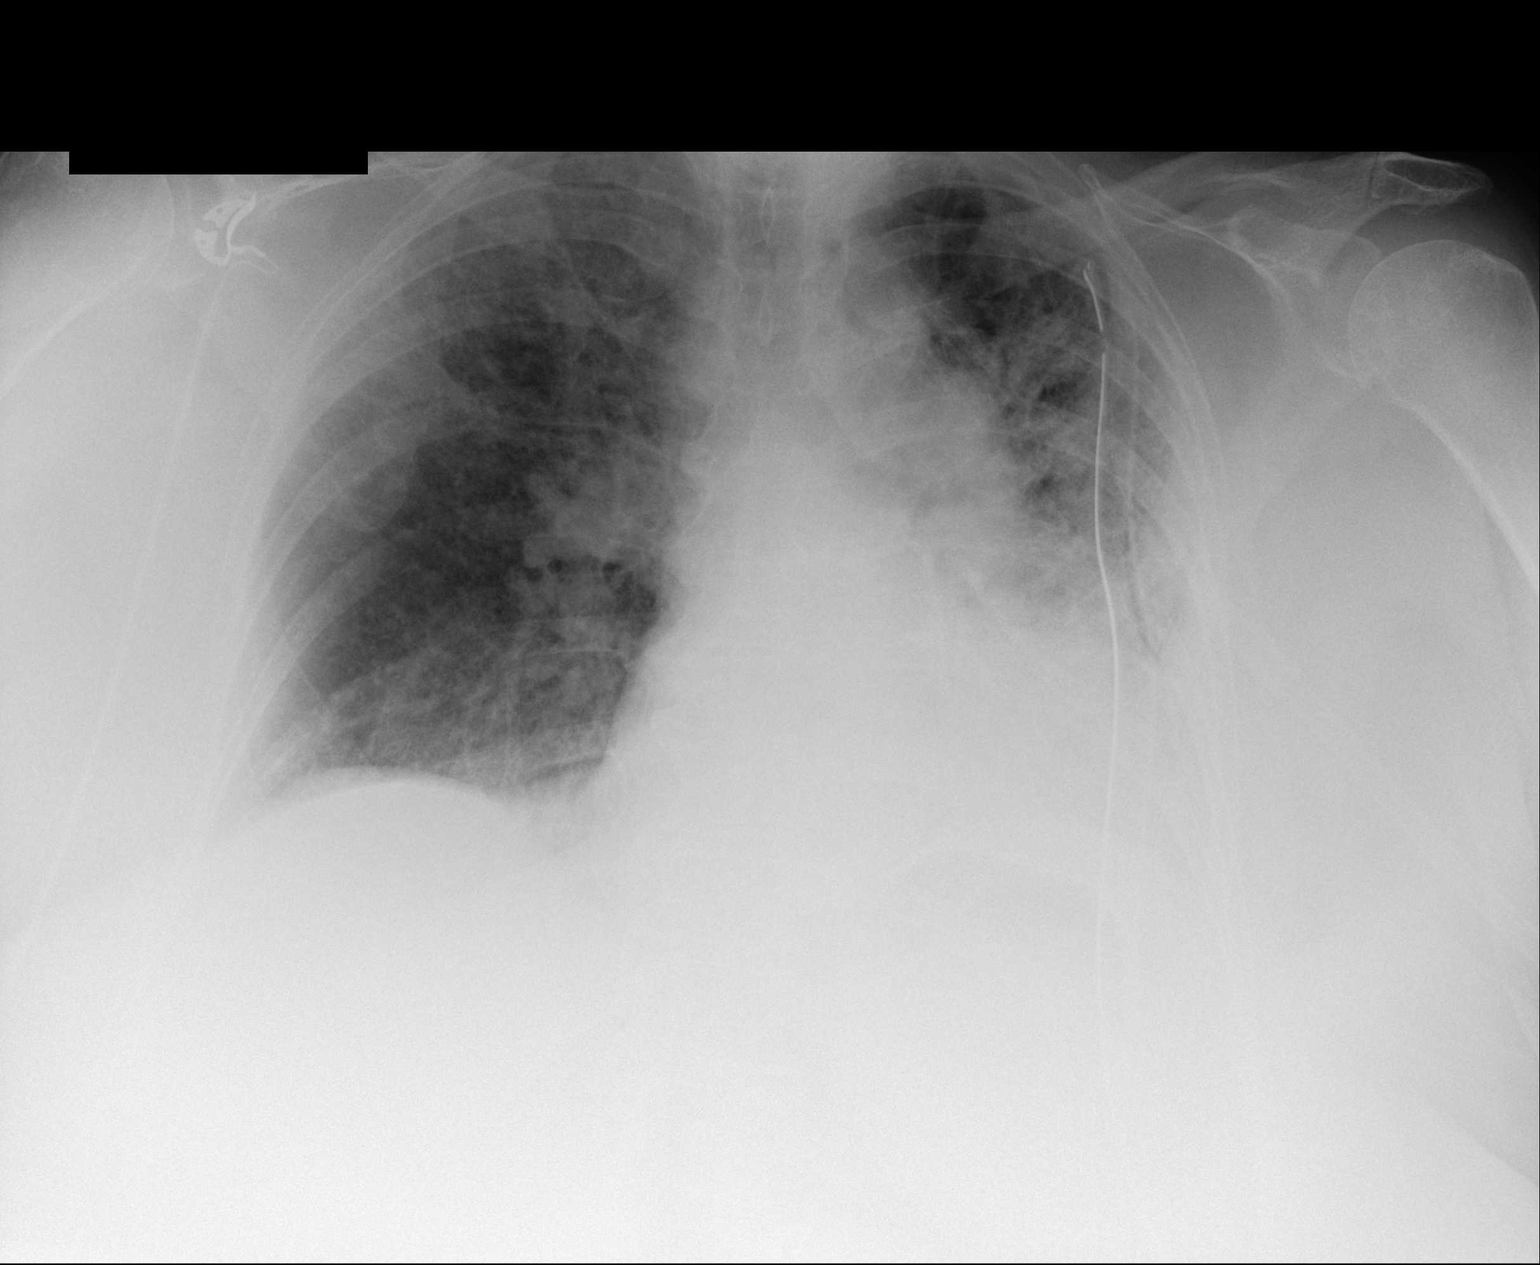

[1 of 1 positions shown; findings below may reference images not displayed]

FINDINGS: Cardiomediastinal silhouette is stable. Again noted bilateral
perihilar and left lower lobe infiltrates. Left chest tube in place.
Postsurgical changes in left upper hemi thorax. Tiny left upper
pneumothorax.
IMPRESSION: Bilateral infiltrates again noted. Left chest tube in place. Tiny
left upper pneumothorax. Postsurgical changes left upper hemithorax.

## 2015-10-06 IMAGING — CR DG CHEST 1V PORT
1 series · 1 of 1 positions shown · non-contrast
Comparison: 02/21/2014

CLINICAL DATA: Status post VATS, left chest tube, interstitial
pneumonia

EXAM:
PORTABLE CHEST - 1 VIEW

[AP]
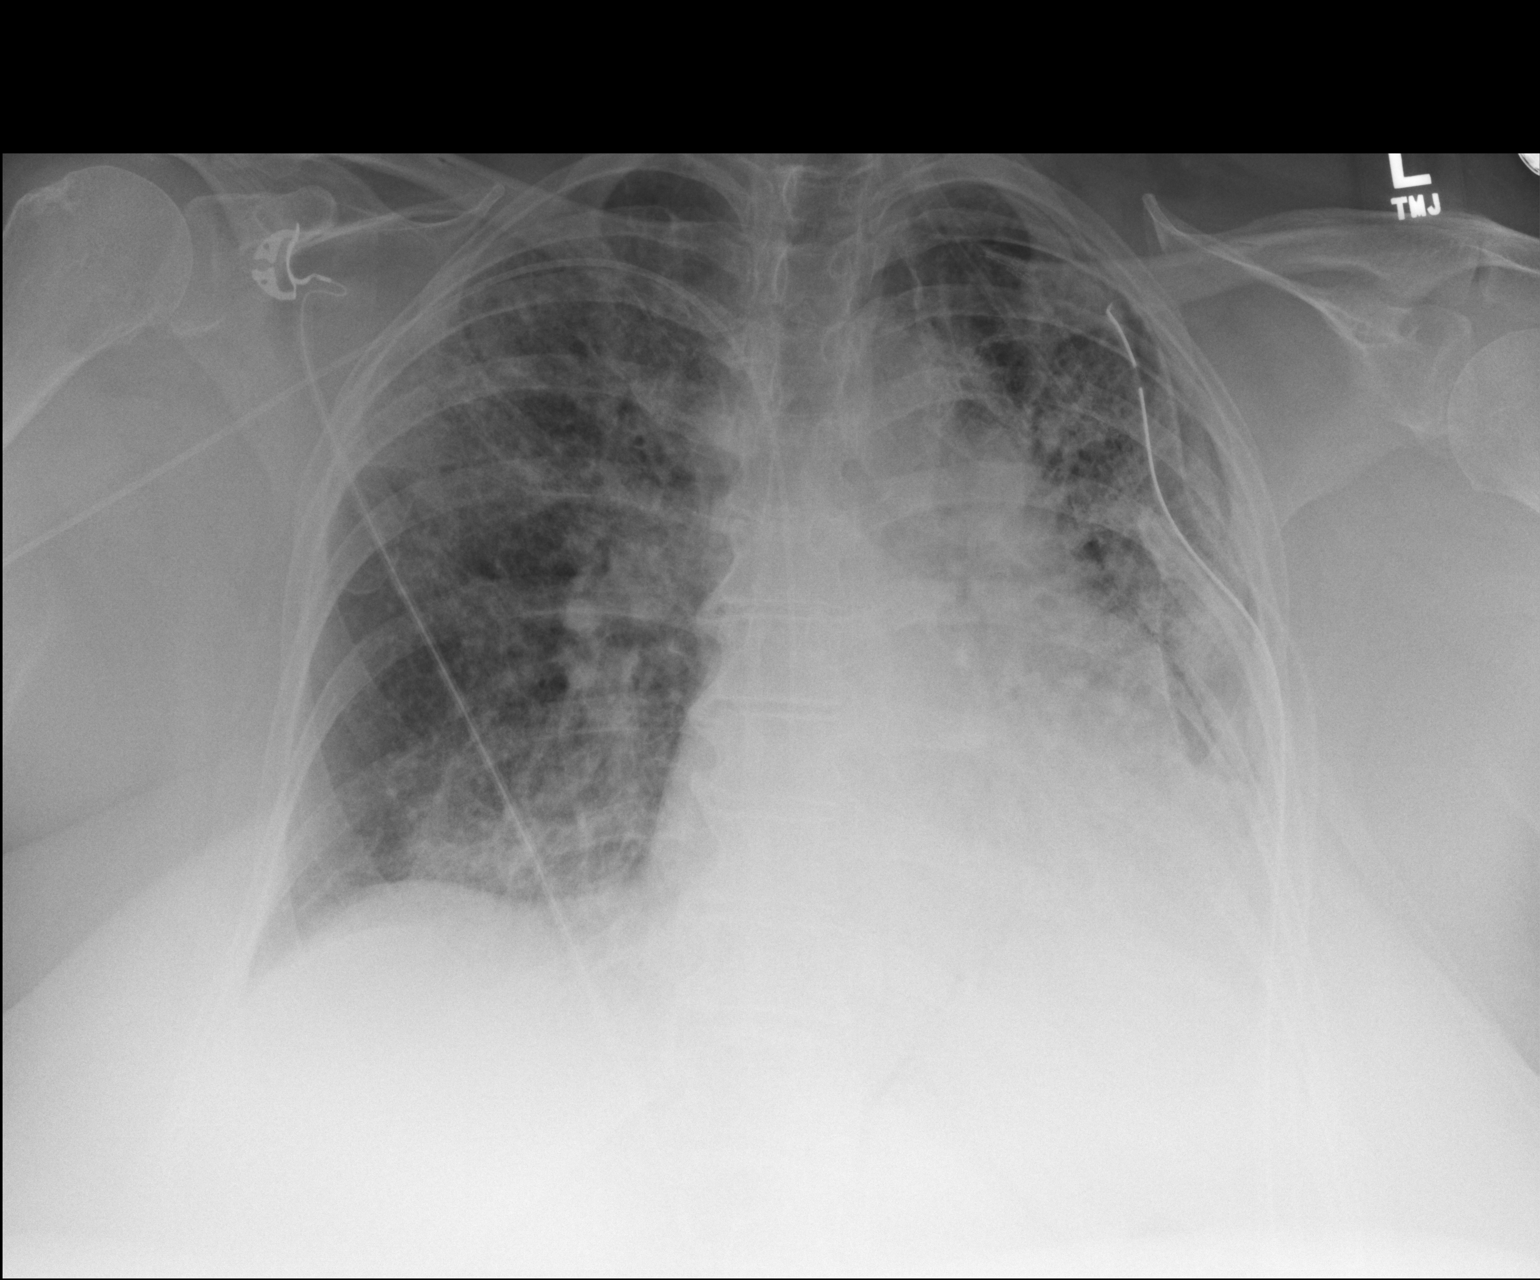

[1 of 1 positions shown; findings below may reference images not displayed]

FINDINGS: Similar pattern of diffuse reticular nodular interstitial opacities
throughout both lungs compatible with interstitial pneumonia (UIP).
Tiny residual right apical pneumothorax. Stable left chest tube
position. Stable cardiomegaly with vascular congestion and basilar
atelectasis. No enlarging effusion. Right PICC line has been
inserted with the tip at the SVC RA junction level. Film is slightly
rotated to the left.
IMPRESSION: Stable interstitial lung disease pattern and tiny residual left
apical pneumothorax. No significant interval change.

## 2015-10-07 IMAGING — CR DG CHEST 1V PORT
1 series · 1 of 1 positions shown · non-contrast
Comparison: 02/22/2014, 02/21/2014, 02/20/2014.  Scratch

CLINICAL DATA: Pneumothorax.  Cough.  Chest tube leak.

EXAM:
PORTABLE CHEST - 1 VIEW

[AP]
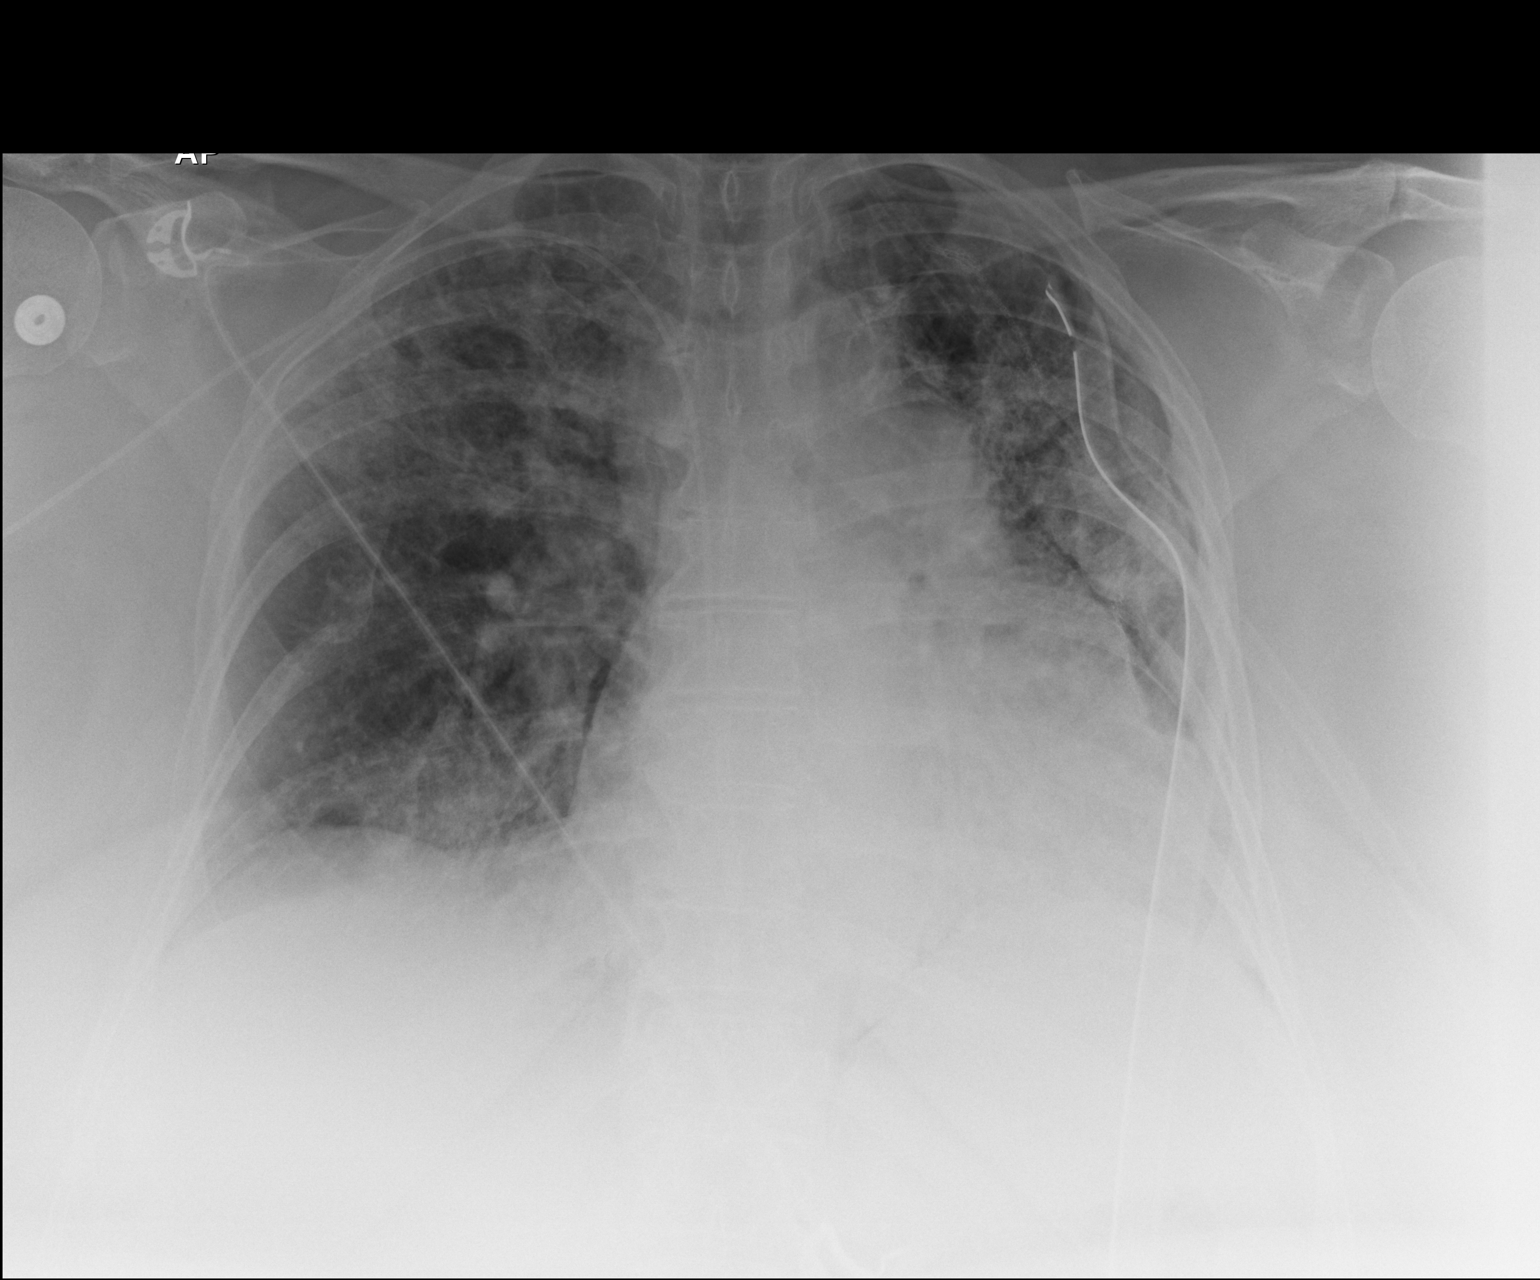

[1 of 1 positions shown; findings below may reference images not displayed]

FINDINGS: Right PICC line, left chest tube in stable position. Stable
cardiomegaly. Stable diffuse pulmonary interstitial changes are
noted. These findings consistent with pneumonitis. Underlying
chronic interstitial lung disease most likely present . Persistent
stable left pneumothorax.
IMPRESSION: 1. Right PICC line and left chest tube in stable position.
2. Stable left apical pneumothorax.
3. Stable interstitial lung disease consistent with pneumonitis.
Component of chronic interstitial lung disease most likely present.

## 2015-10-08 IMAGING — CR DG CHEST 1V PORT
1 series · 1 of 1 positions shown · non-contrast
Comparison: 02/23/2014

CLINICAL DATA: Subsequent evaluation for pneumothorax, chest tube

EXAM:
PORTABLE CHEST - 1 VIEW

[AP]
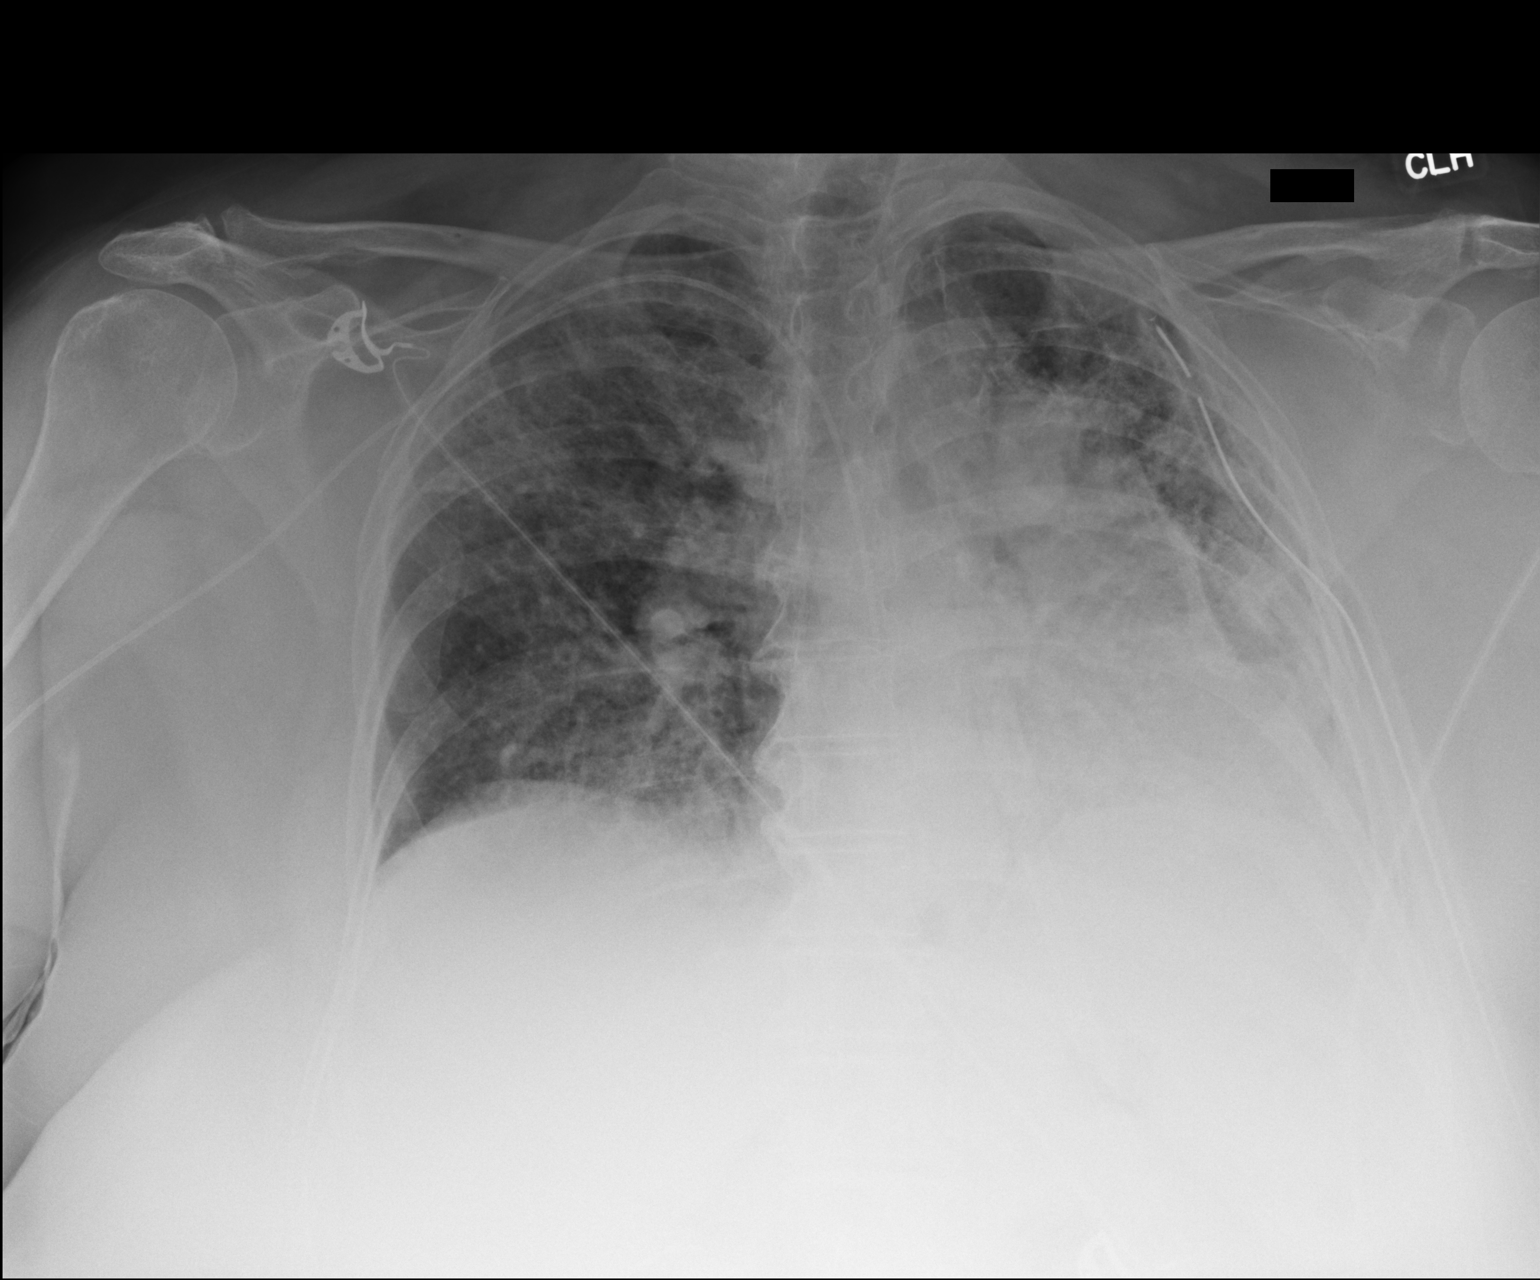

[1 of 1 positions shown; findings below may reference images not displayed]

FINDINGS: Moderate cardiac enlargement. Stable left chest tube and right PICC
line. Stable trace apical pneumothorax on the left.

Patchy bilateral infiltrates unchanged.
IMPRESSION: No significant change.  Stable trace left apical pneumothorax

## 2015-10-09 IMAGING — CR DG CHEST 1V PORT
1 series · 1 of 1 positions shown · non-contrast
Comparison: 02/24/2014

CLINICAL DATA: UIP

EXAM:
PORTABLE CHEST - 1 VIEW

[AP]
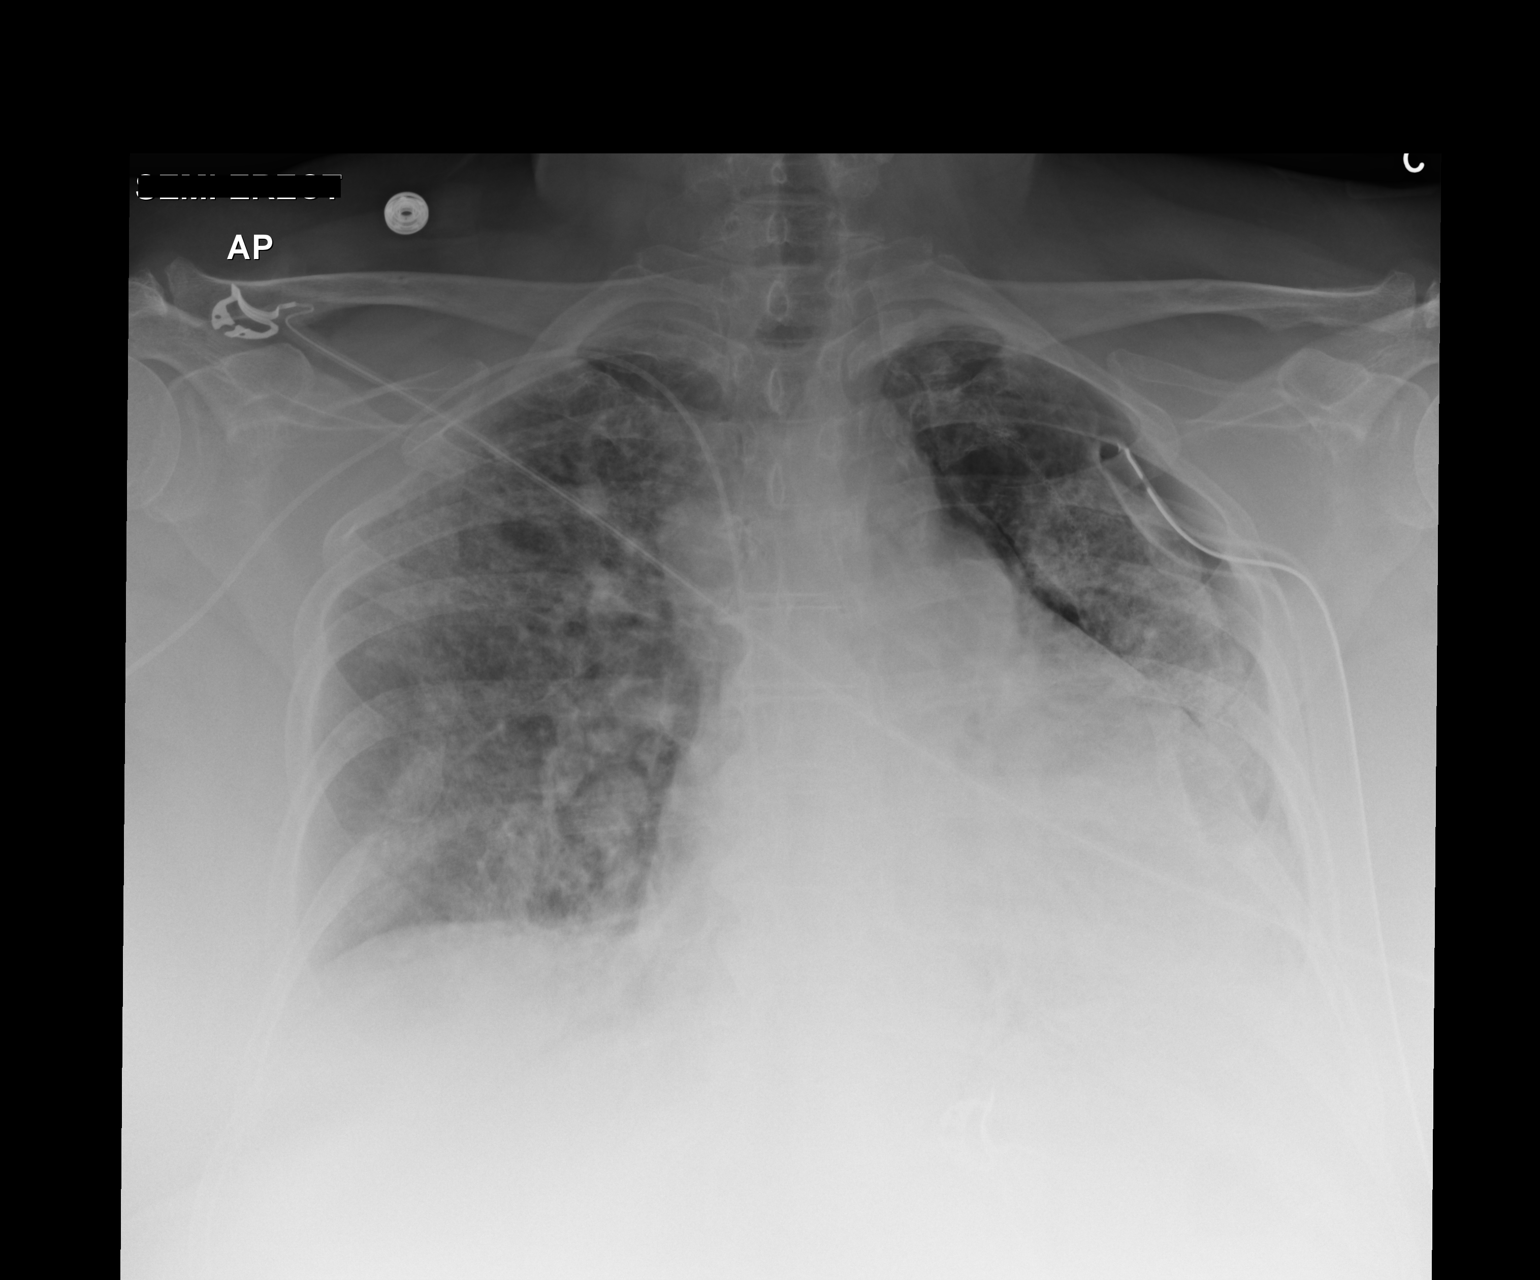

[1 of 1 positions shown; findings below may reference images not displayed]

FINDINGS: Cardiomegaly again noted. Patchy bilateral infiltrates are unchanged
from prior exam. Stable right arm PICC line position. There is a
left chest tube unchanged in position. Small left upper and lateral
pneumothorax. Surgical sutures and postsurgical changes in left
upper lobe. Trace tiny left upper medial pneumothorax.
IMPRESSION: Persistent patchy bilateral infiltrates. Stable left chest tube
position. Small left upper lateral pneumothorax.
